# Patient Record
Sex: Female | Born: 1990 | Race: White | Hispanic: Yes | Smoking: Never smoker
Health system: Southern US, Community
[De-identification: ages and names within clinical notes are randomized; demographics above are authoritative.]

## PROBLEM LIST (undated history)

## (undated) ENCOUNTER — Inpatient Hospital Stay: Payer: Self-pay

## (undated) DIAGNOSIS — O139 Gestational [pregnancy-induced] hypertension without significant proteinuria, unspecified trimester: Secondary | ICD-10-CM

## (undated) DIAGNOSIS — Q9359 Other deletions of part of a chromosome: Secondary | ICD-10-CM

## (undated) DIAGNOSIS — R739 Hyperglycemia, unspecified: Secondary | ICD-10-CM

## (undated) DIAGNOSIS — E039 Hypothyroidism, unspecified: Secondary | ICD-10-CM

## (undated) DIAGNOSIS — F419 Anxiety disorder, unspecified: Secondary | ICD-10-CM

## (undated) DIAGNOSIS — K589 Irritable bowel syndrome without diarrhea: Secondary | ICD-10-CM

## (undated) DIAGNOSIS — Z8759 Personal history of other complications of pregnancy, childbirth and the puerperium: Secondary | ICD-10-CM

## (undated) HISTORY — DX: Anxiety disorder, unspecified: F41.9

## (undated) HISTORY — DX: Other deletions of part of a chromosome: Q93.59

## (undated) HISTORY — PX: NO PAST SURGERIES: SHX2092

## (undated) HISTORY — DX: Irritable bowel syndrome, unspecified: K58.9

## (undated) HISTORY — PX: OTHER SURGICAL HISTORY: SHX169

---
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
## 2011-11-12 ENCOUNTER — Emergency Department (HOSPITAL_COMMUNITY)
Admission: EM | Admit: 2011-11-12 | Discharge: 2011-11-12 | Disposition: A | Discharge status: Home / Self Care | Payer: BC Managed Care – PPO | Attending: Emergency Medicine | Admitting: Emergency Medicine

## 2011-11-12 ENCOUNTER — Encounter (HOSPITAL_COMMUNITY): Payer: Self-pay | Admitting: Emergency Medicine

## 2011-11-12 DIAGNOSIS — L03039 Cellulitis of unspecified toe: Secondary | ICD-10-CM

## 2011-11-12 DIAGNOSIS — L600 Ingrowing nail: Secondary | ICD-10-CM | POA: Insufficient documentation

## 2011-11-12 MED ORDER — BUPIVACAINE HCL (PF) 0.5 % IJ SOLN
10.0000 mL | Freq: Once | INTRAMUSCULAR | Status: DC
  Filled 2011-11-12: qty 30

## 2011-11-12 MED ORDER — CEPHALEXIN 500 MG PO CAPS: 500.0000 mg | ORAL_CAPSULE | Freq: Four times a day (QID) | ORAL | Status: DC

## 2011-11-12 NOTE — ED Notes (Signed)
Pt presents w/ ingrown toenail right great toe progressing for 2 wks. Has treated only w/ Neosporin oint.

## 2011-11-12 NOTE — ED Provider Notes (Signed)
Medical screening examination/treatment/procedure(s) were performed by non-physician practitioner and as supervising physician I was immediately available for consultation/collaboration.  John-Adam Angelise Petrich, M.D.     John-Adam Tametha Banning, MD 11/12/11 2309 

## 2011-11-12 NOTE — ED Provider Notes (Signed)
History     CSN: [id number]  Arrival date & time 11/12/11  1503   First MD Initiated Contact with Patient 11/12/11 1636      Chief Complaint  Patient presents with  . Ingrown Toenail    (Consider location/radiation/quality/duration/timing/severity/associated sxs/prior treatment) HPI Comments: Jill Evans is a 21 y.o. Female who presents with infected toenail. States hx of ingrown toenails. Pain, swelling, drainage to the toenail. States has been putting bacitracin with no relief. States symptoms began 2 wks ago. Pt has hx of ingrown toenails, left removed by her podiatrist. When asked why she didn't go see him, she stated "it is just easier to come here." PT denies fever, chills, pain swelling extending up the toe.    History reviewed. No pertinent past medical history.  History reviewed. No pertinent past surgical history.  No family history on file.  History  Substance Use Topics  . Smoking status: Never Smoker   . Smokeless tobacco: Never Used  . Alcohol Use: No    OB History    Grav Para Term Preterm Abortions TAB SAB Ect Mult Living                  Review of Systems  Constitutional: Negative for fever and chills.  Respiratory: Negative.   Cardiovascular: Negative.   Skin: Positive for color change and wound.  Neurological: Negative for dizziness, weakness and headaches.    Allergies  Review of patient's allergies indicates no known allergies.  Home Medications  No current outpatient prescriptions on file.  BP 127/85  Pulse 91  Temp 98.3 F (36.8 C) (Oral)  SpO2 100%  LMP 11/05/2011  Physical Exam  Nursing note and vitals reviewed. Constitutional: She appears well-developed and well-nourished. No distress.  Eyes: Conjunctivae normal are normal.  Neck: Neck supple.  Cardiovascular: Normal rate, regular rhythm and normal heart sounds.   Pulmonary/Chest: Effort normal and breath sounds normal. No respiratory distress. She has no wheezes. She has  no rales.  Musculoskeletal: Normal range of motion. She exhibits edema and tenderness.       Swelling, erythema, purulent drainage from the right lateral toenail. Tender to palpation. Full ROM of the toe  Neurological: She is alert.  Skin: Skin is warm and dry.  Psychiatric: She has a normal mood and affect.    ED Course  NAIL REMOVAL due to ingrown edge Date/Time: 11/12/2011 6:24 PM Performed by: Jaynie Crumble A Authorized by: Jaynie Crumble A Consent: Verbal consent obtained. Patient understanding: patient states understanding of the procedure being performed Patient identity confirmed: verbally with patient Location: right foot Location details: right big toe Anesthesia: digital block Local anesthetic: bupivacaine 0.5% without epinephrine Anesthetic total: 4 ml Patient sedated: no Preparation: skin prepped with Betadine Amount removed: 1/2 Side: radial Wedge excision of skin of nail fold: yes Nail bed sutured: no Dressing: antibiotic ointment, gauze roll and dressing applied Comments: thoroughlly irrigated the infected portion of the toe   (including critical care time)  6:27 PM half of the toenail removed. Lateral toe and cuticle infected. Will start on antibiotics. Follow up with podiatrist. Pt otherwise non toxic, afebrile    1. Ingrown right big toenail   2. Infected nailbed of toe       MDM          Lottie Mussel, PA 11/12/11 2259

## 2014-05-23 DIAGNOSIS — F988 Other specified behavioral and emotional disorders with onset usually occurring in childhood and adolescence: Secondary | ICD-10-CM | POA: Diagnosis present

## 2014-08-01 ENCOUNTER — Ambulatory Visit (INDEPENDENT_AMBULATORY_CARE_PROVIDER_SITE_OTHER): Payer: Managed Care, Other (non HMO) | Admitting: Podiatry

## 2014-08-01 VITALS — BP 132/88 | HR 97 | Resp 14

## 2014-08-01 DIAGNOSIS — L600 Ingrowing nail: Secondary | ICD-10-CM

## 2014-08-01 DIAGNOSIS — L03031 Cellulitis of right toe: Secondary | ICD-10-CM

## 2014-08-01 MED ORDER — DOXYCYCLINE HYCLATE 100 MG PO TABS: 100.0000 mg | ORAL_TABLET | Freq: Two times a day (BID) | ORAL | Status: DC

## 2014-08-01 NOTE — Progress Notes (Signed)
Subjective:     Patient ID: Jill Evans, female   DOB: August 01, 1990, 24 y.o.   MRN: [id number]  HPI This npatient returns to the office saying her nail is draining but not infected.  She oresents with redness and drainage around the big toenail right foot.  She previously had the nail removed and allowed to grow back.  It has grown back unattached to the nail bed right foot.  Review of Systems     Objective:   Physical Exam Objective: Review of past medical history, medications, social history and allergies were performed.  Vascular: Dorsalis pedis and posterior tibial pulses were palpable B/L, capillary refill was  WNL B/L, temperature gradient was WNL B/L   Skin:  No signs of symptoms of infection or ulcers on both feet  Nails: her right hallux nail plate is unattached at the end of the right big toenail.  There is rendess swelling and drainage around her big toenail.  There is drainage from under her nail plate.  The distal half of the nail plate is unattached rom the nail  Bed and currectte goes completely under nail plate.  Sensory: Jill Evans monifilament WNL   Orthopedic: Orthopedic evaluation demonstrates all joints distal t ankle have full ROM without crepitus, muscle power WNL B/L     Assessment:     Paronychia     Plan:     ROV   I & D right hallux toenail under anesthesia.  Neosporin/ DSD  DSD.  Home instructions given.  Discussed the reoccurence of the infection in her toe and decided to permanently cauterize the nail matrix next visit if problem occurs.

## 2014-08-01 NOTE — Progress Notes (Signed)
   Subjective:    Patient ID: Jill Evans, female    DOB: Jun 24, 1990, 24 y.o.   MRN: [id number]  HPI  My right great toe has some kind of wound it is draining    Review of Systems  All other systems reviewed and are negative.      Objective:   Physical Exam        Assessment & Plan:

## 2014-08-05 ENCOUNTER — Other Ambulatory Visit: Payer: Self-pay | Admitting: Podiatry

## 2014-08-06 ENCOUNTER — Other Ambulatory Visit: Payer: Self-pay | Admitting: *Deleted

## 2014-08-06 ENCOUNTER — Telehealth: Payer: Self-pay | Admitting: Podiatry

## 2014-08-06 NOTE — Telephone Encounter (Signed)
Spoke with Jill Evans at our main location she had already gotten another message and seen that there was some kind of transmission error and had verbally called in the Rx to the [facility] on [address] and left a message for the patients mother.

## 2014-08-06 NOTE — Telephone Encounter (Signed)
Pt's mom called wanting to know why the Doxycycline 100mg  would not be sent to [facility] [address] for patient since she has an infection. Pt's mom stated that is what both she and Dr. Stacie [facility]ayer decided was best [facility]. Please call mom with update as to why Dr. Stacie AcresMayer will not fill this medication and send to their pharmacy.

## 2014-08-08 ENCOUNTER — Ambulatory Visit (INDEPENDENT_AMBULATORY_CARE_PROVIDER_SITE_OTHER): Payer: Managed Care, Other (non HMO) | Admitting: Podiatry

## 2014-08-08 ENCOUNTER — Encounter: Payer: Self-pay | Admitting: Podiatry

## 2014-08-08 ENCOUNTER — Ambulatory Visit: Payer: Managed Care, Other (non HMO) | Admitting: Podiatry

## 2014-08-08 DIAGNOSIS — Z09 Encounter for follow-up examination after completed treatment for conditions other than malignant neoplasm: Secondary | ICD-10-CM

## 2014-08-09 NOTE — Progress Notes (Signed)
Subjective:     Patient ID: Jill Evans, female   DOB: 12-01-1990, 24 y.o.   MRN: [id number]  HPIThis patient presents to the office following excision of her nail and course of antibiotics for her infection.  She presents to the office saying she is doing well and in no pain.  She requests we perform permanent elimination of the borders of right big toe.     Review of Systems     Objective:   Physical Exam Objective: Review of past medical history, medications, social history and allergies were performed.  Vascular: Dorsalis pedis and posterior tibial pulses were palpable B/L, capillary refill was  WNL B/L, temperature gradient was WNL B/L   Skin:  No signs of symptoms of infection or ulcers on both feet  Nails: Her right hallux nail is healing with no drainage at surgical site.  Mild redness at distal aspect of right hallux.  Healing well.  Sensory: Phoebe PerchSemmes Weinstein monifilament WNL   Orthopedic: Orthopedic evaluation demonstrates all joints distal t ankle have full ROM without crepitus, muscle power WNL B/L     Assessment:     S/p nail surgery     Plan:     ROV.  Discussed scheduling nail surgery for permanent correction of ingrowing nail borders.  To schedule in 2 weeks.

## 2014-08-22 ENCOUNTER — Ambulatory Visit (INDEPENDENT_AMBULATORY_CARE_PROVIDER_SITE_OTHER): Payer: Managed Care, Other (non HMO) | Admitting: Podiatry

## 2014-08-22 DIAGNOSIS — L03031 Cellulitis of right toe: Secondary | ICD-10-CM

## 2014-08-22 NOTE — Progress Notes (Signed)
Subjective:     Patient ID: Jill Evans, female   DOB: [birth year], 24 y.o.   MRN: [id number]  HPIPatient presents to the office for planned nail surgery right big toe.  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   Review of Systems     Objective:   Physical Exam GENERAL APPEARANCE: Alert, conversant. Appropriately groomed. No acute distress.  VASCULAR: Pedal pulses palpable at 2/4 DP and PT bilateral.  Capillary refill time is immediate to all digits,  Proximal to distal cooling it warm to warm.  Digital hair growth is present bilateral  NEUROLOGIC: sensation is intact epicritically and protectively to 5.07 monofilament at 5/5 sites bilateral.  Light touch is intact bilateral, vibratory sensation intact bilateral, achilles tendon reflex is intact bilateral.  MUSCULOSKELETAL: acceptable muscle strength, tone and stability bilateral.  Intrinsic muscluature intact bilateral.  Rectus appearance of foot and digits noted bilateral.   DERMATOLOGIC: skin color, texture, and turgor are within normal limits.  No preulcerative lesions or ulcers  are seen, no interdigital maceration noted.  No open lesions present.  Digital nails are asymptomatic. No drainage noted. NAILS  The right nail plate is absent and the chronic infection has resolved.       Assessment:   Paronychia right hallux    Plan:     ROV  Nail surgery.Treatment options and alternatives discussed.  Recommended permanent phenol matrixectomy and patient agreed.  Right hallux  was prepped with alcohol and a toe block of 3cc of 2% lidocaine plain was administered  .  The toe was then prepped with betadine solution .  The proximal nail borders were freed using periosteal elevator.  Phenol was then applied to the matrix tissue followed by an alcohol wash.  Antibiotic ointment and a dry sterile dressing was applied.  The patient was dispensed instructions for aftercare.

## 2015-06-30 DIAGNOSIS — E559 Vitamin D deficiency, unspecified: Secondary | ICD-10-CM

## 2015-06-30 HISTORY — DX: Vitamin D deficiency, unspecified: E55.9

## 2017-08-25 DIAGNOSIS — N912 Amenorrhea, unspecified: Secondary | ICD-10-CM | POA: Diagnosis not present

## 2017-08-25 DIAGNOSIS — Z0000 Encounter for general adult medical examination without abnormal findings: Secondary | ICD-10-CM | POA: Diagnosis not present

## 2017-08-25 DIAGNOSIS — G441 Vascular headache, not elsewhere classified: Secondary | ICD-10-CM | POA: Diagnosis not present

## 2017-08-25 DIAGNOSIS — E669 Obesity, unspecified: Secondary | ICD-10-CM | POA: Diagnosis not present

## 2017-08-25 DIAGNOSIS — Z79899 Other long term (current) drug therapy: Secondary | ICD-10-CM | POA: Diagnosis not present

## 2017-08-25 DIAGNOSIS — G44021 Chronic cluster headache, intractable: Secondary | ICD-10-CM | POA: Diagnosis not present

## 2017-08-29 DIAGNOSIS — G441 Vascular headache, not elsewhere classified: Secondary | ICD-10-CM | POA: Diagnosis not present

## 2017-08-29 DIAGNOSIS — M545 Low back pain: Secondary | ICD-10-CM | POA: Diagnosis not present

## 2017-08-29 DIAGNOSIS — E669 Obesity, unspecified: Secondary | ICD-10-CM | POA: Diagnosis not present

## 2017-08-29 DIAGNOSIS — Z79899 Other long term (current) drug therapy: Secondary | ICD-10-CM | POA: Diagnosis not present

## 2017-09-12 ENCOUNTER — Telehealth: Payer: Self-pay

## 2017-09-12 DIAGNOSIS — M545 Low back pain: Secondary | ICD-10-CM | POA: Diagnosis not present

## 2017-09-12 DIAGNOSIS — O3680X0 Pregnancy with inconclusive fetal viability, not applicable or unspecified: Secondary | ICD-10-CM

## 2017-09-12 DIAGNOSIS — M25561 Pain in right knee: Secondary | ICD-10-CM | POA: Diagnosis not present

## 2017-09-12 NOTE — Progress Notes (Signed)
Opened in error

## 2017-09-12 NOTE — Telephone Encounter (Signed)
Tried to contact Pt to confirm that she will be calling her personal OB/GYN per Orthopedic Nurse office to schedule a viability US asap.No answer, does not have VM set up.

## 2017-09-13 ENCOUNTER — Telehealth: Payer: Self-pay

## 2017-09-13 ENCOUNTER — Other Ambulatory Visit (HOSPITAL_COMMUNITY): Payer: Self-pay

## 2017-09-13 DIAGNOSIS — O3680X0 Pregnancy with inconclusive fetal viability, not applicable or unspecified: Secondary | ICD-10-CM

## 2017-09-13 NOTE — Telephone Encounter (Signed)
Pt's mother called stating that her daughter is special needs and they found out yesterday through an x ray that she was pregnant. She stated that during the x ray they "saw the skeleton of a fetus". Mother states pt's LMP was Oct 2018 but not sure how accurate that is. They wanted an appt today but Dr.Dove wanted an U/S before pt could be seen. PT had an U/S scheduled for this morning at 7:45 and cancelled it. I placed the order and the soonest MFM can see her is Friday 8/16 at 1:15. Appt was made. I also made an appt for pt on Monday morning 09/18/17 in our office to see a midwife to discuss U/S results and plan. Mother is aware of appointment dates and times.

## 2017-09-14 ENCOUNTER — Encounter (HOSPITAL_COMMUNITY): Payer: Self-pay

## 2017-09-15 ENCOUNTER — Ambulatory Visit (HOSPITAL_COMMUNITY): Payer: Self-pay

## 2017-09-18 ENCOUNTER — Encounter: Payer: Self-pay | Admitting: Advanced Practice Midwife

## 2017-09-18 ENCOUNTER — Telehealth: Payer: Self-pay

## 2017-09-18 DIAGNOSIS — Z3401 Encounter for supervision of normal first pregnancy, first trimester: Secondary | ICD-10-CM

## 2017-09-18 NOTE — Telephone Encounter (Signed)
Called the phone number on pt's chart to reschedule appt because we needed U/S done before she came in and she cancelled her U/S. When I dial the number [phone]) it gives me busy signal. I have tried multiple times and it continues to give me the busy signal. I then called emergency contact on her chart Bonner Puna(Johnny Thompson [phone]) and he stated that he doesn't know them anymore and they have no contact with each other and he doesn't have a way to get in contact with her. He requests number be removed from pt's chart.

## 2017-09-21 ENCOUNTER — Ambulatory Visit (HOSPITAL_COMMUNITY)
Admission: RE | Admit: 2017-09-21 | Discharge: 2017-09-21 | Disposition: A | Discharge status: Home / Self Care | Payer: BLUE CROSS/BLUE SHIELD | Source: Ambulatory Visit | Attending: Obstetrics & Gynecology | Admitting: Obstetrics & Gynecology

## 2017-09-21 ENCOUNTER — Other Ambulatory Visit (HOSPITAL_COMMUNITY): Payer: Self-pay | Admitting: *Deleted

## 2017-09-21 DIAGNOSIS — O0932 Supervision of pregnancy with insufficient antenatal care, second trimester: Secondary | ICD-10-CM | POA: Diagnosis not present

## 2017-09-21 DIAGNOSIS — Z363 Encounter for antenatal screening for malformations: Secondary | ICD-10-CM

## 2017-09-21 DIAGNOSIS — Z3A28 28 weeks gestation of pregnancy: Secondary | ICD-10-CM | POA: Diagnosis not present

## 2017-09-21 DIAGNOSIS — O3680X0 Pregnancy with inconclusive fetal viability, not applicable or unspecified: Secondary | ICD-10-CM | POA: Insufficient documentation

## 2017-09-21 DIAGNOSIS — Z362 Encounter for other antenatal screening follow-up: Secondary | ICD-10-CM

## 2017-09-21 DIAGNOSIS — O0933 Supervision of pregnancy with insufficient antenatal care, third trimester: Secondary | ICD-10-CM | POA: Diagnosis not present

## 2017-10-10 ENCOUNTER — Ambulatory Visit (INDEPENDENT_AMBULATORY_CARE_PROVIDER_SITE_OTHER): Payer: BLUE CROSS/BLUE SHIELD | Admitting: Obstetrics & Gynecology

## 2017-10-10 ENCOUNTER — Encounter: Payer: Self-pay | Admitting: Obstetrics & Gynecology

## 2017-10-10 DIAGNOSIS — O0933 Supervision of pregnancy with insufficient antenatal care, third trimester: Secondary | ICD-10-CM

## 2017-10-10 DIAGNOSIS — O0930 Supervision of pregnancy with insufficient antenatal care, unspecified trimester: Secondary | ICD-10-CM

## 2017-10-10 DIAGNOSIS — Z113 Encounter for screening for infections with a predominantly sexual mode of transmission: Secondary | ICD-10-CM

## 2017-10-10 DIAGNOSIS — Z3493 Encounter for supervision of normal pregnancy, unspecified, third trimester: Secondary | ICD-10-CM | POA: Diagnosis not present

## 2017-10-10 DIAGNOSIS — Z23 Encounter for immunization: Secondary | ICD-10-CM | POA: Diagnosis not present

## 2017-10-10 DIAGNOSIS — Z3490 Encounter for supervision of normal pregnancy, unspecified, unspecified trimester: Secondary | ICD-10-CM | POA: Diagnosis not present

## 2017-10-10 DIAGNOSIS — Z124 Encounter for screening for malignant neoplasm of cervix: Secondary | ICD-10-CM

## 2017-10-10 DIAGNOSIS — O0990 Supervision of high risk pregnancy, unspecified, unspecified trimester: Secondary | ICD-10-CM | POA: Insufficient documentation

## 2017-10-10 HISTORY — DX: Supervision of pregnancy with insufficient antenatal care, unspecified trimester: O09.30

## 2017-10-10 HISTORY — DX: Supervision of high risk pregnancy, unspecified, unspecified trimester: O09.90

## 2017-10-10 NOTE — Progress Notes (Signed)
  Subjective:    Jill Evans is being seen today for her first obstetrical visit.  This is not a planned pregnancy. She is at [redacted]w[redacted]d gestation. Her obstetrical history is significant for late prenatal care. Relationship with FOB: significant other, not living together. Patient does not intend to breast feed. Pregnancy history fully reviewed.  Patient reports no complaints.  Review of Systems:   Review of Systems  Objective:     BP 137/90   Pulse 85   Ht 4\' 11"  (1.499 m)   Wt 165 lb (74.8 kg)   BMI 33.33 kg/m  Physical Exam  Exam  Heart- rrr Lungs- CTAB Abd- benign, gravid FH- 29 FHR-130s Cervix - normal Good pubic arch    Assessment:    Pregnancy: G1P0 Patient Active Problem List   Diagnosis Date Noted  . Normal pregnancy, antepartum 10/10/2017  . Late prenatal care 10/10/2017       Plan:     Initial labs drawn. Prenatal vitamins. Problem list reviewed and updated. 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 BFA  Mehar Kirkwood C Jebediah Macrae 10/10/2017

## 2017-10-11 LAB — HIV ANTIBODY (ROUTINE TESTING W REFLEX): HIV: NONREACTIVE

## 2017-10-11 LAB — OBSTETRIC PANEL
Antibody Screen: NOT DETECTED
Basophils Absolute: 22 cells/uL (ref 0–200)
Basophils Relative: 0.2 %
EOS ABS: 231 {cells}/uL (ref 15–500)
Eosinophils Relative: 2.1 %
HCT: 32.3 % — ABNORMAL LOW (ref 35.0–45.0)
HEMOGLOBIN: 11.3 g/dL — AB (ref 11.7–15.5)
Hepatitis B Surface Ag: NONREACTIVE
LYMPHS ABS: 2849 {cells}/uL (ref 850–3900)
MCH: 31.1 pg (ref 27.0–33.0)
MCHC: 35 g/dL (ref 32.0–36.0)
MCV: 89 fL (ref 80.0–100.0)
MPV: 11 fL (ref 7.5–12.5)
Monocytes Relative: 6.9 %
NEUTROS PCT: 64.9 %
Neutro Abs: 7139 cells/uL (ref 1500–7800)
Platelets: 298 10*3/uL (ref 140–400)
RBC: 3.63 10*6/uL — ABNORMAL LOW (ref 3.80–5.10)
RDW: 11.7 % (ref 11.0–15.0)
RPR: NONREACTIVE
RUBELLA: 1.59 {index}
TOTAL LYMPHOCYTE: 25.9 %
WBC: 11 10*3/uL — ABNORMAL HIGH (ref 3.8–10.8)
WBCMIX: 759 {cells}/uL (ref 200–950)

## 2017-10-11 LAB — HEMOGLOBIN A1C
EAG (MMOL/L): 5.5 (calc)
Hgb A1c MFr Bld: 5.1 % of total Hgb (ref <5.7)
MEAN PLASMA GLUCOSE: 100 (calc)

## 2017-10-11 LAB — CYTOLOGY - PAP
ADEQUACY: ABSENT
CHLAMYDIA, DNA PROBE: NEGATIVE
Diagnosis: NEGATIVE
NEISSERIA GONORRHEA: NEGATIVE

## 2017-10-19 ENCOUNTER — Ambulatory Visit (HOSPITAL_COMMUNITY)
Admission: RE | Admit: 2017-10-19 | Discharge: 2017-10-19 | Disposition: A | Discharge status: Home / Self Care | Payer: BLUE CROSS/BLUE SHIELD | Source: Ambulatory Visit | Attending: Obstetrics & Gynecology | Admitting: Obstetrics & Gynecology

## 2017-10-19 DIAGNOSIS — Z362 Encounter for other antenatal screening follow-up: Secondary | ICD-10-CM | POA: Diagnosis not present

## 2017-10-19 DIAGNOSIS — O0933 Supervision of pregnancy with insufficient antenatal care, third trimester: Secondary | ICD-10-CM | POA: Diagnosis not present

## 2017-10-19 DIAGNOSIS — Z3687 Encounter for antenatal screening for uncertain dates: Secondary | ICD-10-CM | POA: Diagnosis not present

## 2017-10-19 DIAGNOSIS — Z3A32 32 weeks gestation of pregnancy: Secondary | ICD-10-CM

## 2017-10-24 ENCOUNTER — Ambulatory Visit (INDEPENDENT_AMBULATORY_CARE_PROVIDER_SITE_OTHER): Payer: BLUE CROSS/BLUE SHIELD | Admitting: Nurse Practitioner

## 2017-10-24 ENCOUNTER — Ambulatory Visit: Payer: Self-pay

## 2017-10-24 VITALS — BP 146/97 | HR 76 | Wt 168.9 lb

## 2017-10-24 DIAGNOSIS — O1414 Severe pre-eclampsia complicating childbirth: Secondary | ICD-10-CM | POA: Insufficient documentation

## 2017-10-24 DIAGNOSIS — O0993 Supervision of high risk pregnancy, unspecified, third trimester: Secondary | ICD-10-CM | POA: Diagnosis not present

## 2017-10-24 DIAGNOSIS — O133 Gestational [pregnancy-induced] hypertension without significant proteinuria, third trimester: Secondary | ICD-10-CM

## 2017-10-24 DIAGNOSIS — O0990 Supervision of high risk pregnancy, unspecified, unspecified trimester: Secondary | ICD-10-CM

## 2017-10-24 DIAGNOSIS — O139 Gestational [pregnancy-induced] hypertension without significant proteinuria, unspecified trimester: Secondary | ICD-10-CM

## 2017-10-24 DIAGNOSIS — O360930 Maternal care for other rhesus isoimmunization, third trimester, not applicable or unspecified: Secondary | ICD-10-CM | POA: Diagnosis not present

## 2017-10-24 HISTORY — DX: Severe pre-eclampsia complicating childbirth: O14.14

## 2017-10-24 LAB — POCT URINALYSIS DIP (DEVICE)
BILIRUBIN URINE: NEGATIVE
GLUCOSE, UA: NEGATIVE mg/dL
HGB URINE DIPSTICK: NEGATIVE
KETONES UR: NEGATIVE mg/dL
LEUKOCYTES UA: NEGATIVE
Nitrite: NEGATIVE
Protein, ur: 100 mg/dL — AB
UROBILINOGEN UA: 0.2 mg/dL (ref 0.0–1.0)
pH: 6.5 (ref 5.0–8.0)

## 2017-10-24 MED ORDER — RHO D IMMUNE GLOBULIN 1500 UNIT/2ML IJ SOSY
300.0000 ug | PREFILLED_SYRINGE | Freq: Once | INTRAMUSCULAR | Status: AC
  Administered 2017-10-24: 300 ug via INTRAMUSCULAR

## 2017-10-24 NOTE — Patient Instructions (Addendum)
RETURN for all appointments as scheduled.    Childbirth Education Options: [facility] Department Classes:  Childbirth education classes can help you get ready for a positive parenting experience. You can also meet other expectant parents and get free stuff for your baby. Each class runs for five weeks on the same night and costs $45 for the mother-to-be and her support person. Medicaid covers the cost if you are eligible. Call [phone] to register. [facility] Childbirth Education:  [phone] or [phone] or [email or web address]  Baby & Me Class: Discuss newborn & infant parenting and family adjustment issues with other new mothers in a relaxed environment. Each week brings a new speaker or baby-centered activity. We encourage new mothers to join [place] every Thursday at 11:00am. Babies birth until crawling. No registration or fee. Daddy [employer]: This course offers Dads-to-be the tools and knowledge needed to feel confident on their journey to becoming new fathers. Experienced dads, who have been trained as coaches, teach dads-to-be how to hold, comfort, diaper, swaddle and play with their infant while being able to support the new mom as well. A class for men taught by men. $25/dad Big Brother/Big Sister: Let your children share in the joy of a new brother or sister in this special class designed just for them. Class includes discussion about how families care for babies: swaddling, holding, diapering, safety as well as how they can be helpful in their new role. This class is designed for children ages 4 to 9, but any age is welcome. Please register each child individually. $5/child  Mom Talk: This mom-led group offers support and connection to mothers as they journey through the adjustments and struggles of that sometimes overwhelming first year after the birth of a child. Tuesdays at 10:00am and Thursdays at 6:00pm. Babies welcome. No registration or fee. Breastfeeding  Support Group: This group is a mother-to-mother support circle where moms have the opportunity to share their breastfeeding experiences. A Lactation Consultant is present for questions and concerns. Meets each Tuesday at 11:00am. No fee or registration. Breastfeeding Your Baby: Learn what to expect in the first days of breastfeeding your newborn.  This class will help you feel more confident with the skills needed to begin your breastfeeding experience. Many new mothers are concerned about breastfeeding after leaving the hospital. This class will also address the most common fears and challenges about breastfeeding during the first few weeks, months and beyond. (call for fee) Comfort Techniques and Tour: This 2 hour interactive class will provide you the opportunity to learn & practice hands-on techniques that can help relieve some of the discomfort of labor and encourage your baby to rotate toward the best position for birth. You and your partner will be able to try a variety of labor positions with birth balls and rebozos as well as practice breathing, relaxation, and visualization techniques. A tour of the [facility] is included with this class. $20 per registrant and support person Childbirth Class- Weekend Option: This class is a Weekend version of our Birth & Baby series. It is designed for parents who have a difficult time fitting several weeks of classes into their schedule. It covers the care of your newborn and the basics of labor and childbirth. It also includes a [place] [facility] and lunch. The class is held two consecutive days: beginning on Friday evening from 6:30 - 8:30 p.m. and the next day, Saturday from 9 a.m. - 4 p.m. (call for  fee) Doren Custard Class: Interested in a waterbirth?  This informational class will help you discover whether waterbirth is the right fit for you. Education about waterbirth itself, supplies you would need and how  to assemble your support team is what you can expect from this class. Some obstetrical practices require this class in order to pursue a waterbirth. (Not all obstetrical practices offer waterbirth-check with your healthcare provider.) Register only the expectant mom, but you are encouraged to bring your partner to class! Required if planning waterbirth, no fee. Infant/Child CPR: Parents, grandparents, babysitters, and friends learn Cardio-Pulmonary Resuscitation skills for infants and children. You will also learn how to treat both conscious and unconscious choking in infants and children. This Family & Friends program does not offer certification. Register each participant individually to ensure that enough mannequins are available. (Call for fee) Grandparent Love: Expecting a grandbaby? This class is for you! Learn about the latest infant care and safety recommendations and ways to support your own child as he or she transitions into the parenting role. Taught by Registered Nurses who are childbirth instructors, but most importantly...they are grandmothers too! $10/person. Childbirth Class- Natural Childbirth: This series of 5 weekly classes is for expectant parents who want to learn and practice natural methods of coping with the process of labor and childbirth. Relaxation, breathing, massage, visualization, role of the partner, and helpful positioning are highlighted. Participants learn how to be confident in their body's ability to give birth. This class will empower and help parents make informed decisions about their own care. Includes discussion that will help new parents transition into the immediate postpartum period. [facility] is included. We suggest taking this class between 25-32 weeks, but it's only a recommendation. $75 per registrant and one support person or $30 Medicaid. Childbirth Class- 3 week Series: This option of 3 weekly classes helps you and your  labor partner prepare for childbirth. Newborn care, labor & birth, cesarean birth, pain management, and comfort techniques are discussed and a [place] [facility] is included. The class meets at the same time, on the same day of the week for 3 consecutive weeks beginning with the starting date you choose. $60 for registrant and one support person.  Marvelous Multiples: Expecting twins, triplets, or more? This class covers the differences in labor, birth, parenting, and breastfeeding issues that face multiples' parents. NICU tour is included. Led by a Certified Childbirth Educator who is the mother of twins. No fee. Caring for Baby: This class is for expectant and adoptive parents who want to learn and practice the most up-to-date newborn care for their babies. Focus is on birth through the first six weeks of life. Topics include feeding, bathing, diapering, crying, umbilical cord care, circumcision care and safe sleep. Parents learn to recognize symptoms of illness and when to call the pediatrician. Register only the mom-to-be and your partner or support person can plan to come with you! $10 per registrant and support person Childbirth Class- online option: This online class offers you the freedom to complete a Birth and Baby series in the comfort of your own home. The flexibility of this option allows you to review sections at your own pace, at times convenient to you and your support people. It includes additional video information, animations, quizzes, and extended activities. Get organized with helpful eClass tools, checklists, and trackers. Once you register online for the class, you will receive an email within a few days to accept  the invitation and begin the class when the time is right for you. The content will be available to you for 60 days. $60 for 60 days of online access for you and your support people.  Local Doulas: Natural Baby  Doulas [email or web address] Tel: [phone] [email or web address] [email or web address] [phone] [email or web address] The Labor Hassell Halim  (also do waterbirth tub rental) [phone] [email or web address] [email or web address] Birth Doula [phone] [email or web address] [place] [email or web address] [employer] (PADA) [email or web address] [place] Birth [facility] Baby  [email or web address] Considering Waterbirth? Guide for patients at Center for [employer]  Why consider waterbirth?  . Gentle birth for babies . Less pain medicine used in labor . May allow for passive descent/less pushing . May reduce perineal tears  . More mobility and instinctive maternal position changes . Increased maternal relaxation . Reduced blood pressure in labor  Is waterbirth safe? What are the risks of infection, drowning or other complications?  . Infection: o Very low risk (3.7 % for tub vs 4.8% for bed) o 7 in 8000 waterbirths with documented infection o Poorly cleaned equipment most common cause o Slightly lower group B strep transmission rate  . Drowning o Maternal:  - Very low risk   - Related to seizures or fainting o Newborn:  - Very low risk. No evidence of increased risk of respiratory problems in multiple large studies - Physiological protection from breathing under water - Avoid underwater birth if there are any fetal complications - Once baby's head is out of the water, keep it out.  . Birth complication o Some reports of cord trauma, but risk decreased by bringing baby to surface gradually o No evidence of increased risk of shoulder dystocia. Mothers can usually change positions faster in water than in a bed, possibly aiding the maneuvers to free the shoulder.   You  must attend a Doren Custard class at [facility]  3rd Wednesday of every month from 7-9pm  [employer] by calling [phone] or online at [email or web address]  Bring [place] the certificate from the class to your prenatal appointment  Meet with a midwife at 36 weeks to see if you can still plan a waterbirth and to sign the consent.   Purchase or rent the following supplies:   Water Birth Pool (Birth Pool in a Box or Gideon for instance)  (Tubs start ~$125)  Single-use disposable tub liner designed for your brand of tub  New garden hose labeled "lead-free", "suitable for drinking water",  Electric drain pump to remove water (We recommend 792 gallon per hour or greater pump.)   Separate garden hose to remove the dirty water  Fish net  Bathing suit top (optional)  Long-handled mirror (optional)  Places to purchase or rent supplies  GotWebTools.is for tub purchases and supplies  Waterbirthsolutions.com for tub purchases and supplies  The Labor Ladies (www.[email or web address]) $275 for tub rental/set-up & take down/kit   [employer] (http://www.fleming.com/.htm) Information regarding doulas (labor support) who provide pool rentals  Our practice has a Birth Pool in a Box tub at the hospital that you may borrow on a first-come-first-served basis. It is your responsibility to to set up, clean and break down the tub. We cannot guarantee the availability of this tub in advance. You are responsible for bringing all accessories listed above. If you do not have all necessary supplies you cannot have a waterbirth.    Things that would prevent you from having a waterbirth:  Premature, <37wks  Previous cesarean birth  Presence of thick meconium-stained fluid  Multiple gestation (Twins, triplets, etc.)  Uncontrolled diabetes or gestational diabetes requiring medication  Hypertension requiring medication or diagnosis of pre-eclampsia  Heavy  vaginal bleeding  Non-reassuring fetal heart rate  Active infection (MRSA, etc.). Group B Strep is NOT a contraindication for  waterbirth.  If your labor has to be induced and induction method requires continuous  monitoring of the baby's heart rate  Other risks/issues identified by your obstetrical provider  Please remember that birth is unpredictable. Under certain unforeseeable circumstances your provider may advise against giving birth in the tub. These decisions will be made on a case-by-case basis and with the safety of you and your baby as our highest priority.      Hypertension During Pregnancy Hypertension is also called high blood pressure. High blood pressure means that the force of your blood moving in your body is too strong. When you are pregnant, this condition should be watched carefully. It can cause problems for you and your baby. Follow these instructions at home: Eating and drinking  Drink enough fluid to keep your pee (urine) clear or pale yellow.  Eat healthy foods that are low in salt (sodium). ? Do not add salt to your food. ? Check labels on foods and drinks to see much salt is in them. Look on the label where you see "Sodium." Lifestyle  Do not use any products that contain nicotine or tobacco, such as cigarettes and e-cigarettes. If you need help quitting, ask your doctor.  Do not use alcohol.  Avoid caffeine.  Avoid stress. Rest and get plenty of sleep. General instructions  Take over-the-counter and prescription medicines only as told by your doctor.  While lying down, lie on your left side. This keeps pressure off your baby.  While sitting or lying down, raise (elevate) your feet. Try putting some pillows under your lower legs.  Exercise regularly. Ask your doctor what kinds of exercise are best for you.  Keep all prenatal and follow-up visits as told by your doctor. This is important. Contact a doctor if:  You have symptoms that your  doctor told you to watch for, such as: ? Fever. ? Throwing up (vomiting). ? Headache. Get help right away if:  You have very bad pain in your belly (abdomen).  You are throwing up, and this does not get better with treatment.  You suddenly get swelling in your hands, ankles, or face.  You gain 4 lb (1.8 kg) or more in 1 week.  You get bleeding from your vagina.  You have blood in your pee.  You do not feel your baby moving as much as normal.  You have a change in vision.  You have muscle twitching or sudden tightening (spasms).  You have trouble breathing.  Your lips or fingernails turn blue. This information is not intended to replace advice given to you by your health care provider. Make sure you discuss any questions you have with your health care provider. Document Released: 02/19/2010 Document Revised: 09/29/2015 Document Reviewed: 09/29/2015 Elsevier Interactive Patient Education  [employer].

## 2017-10-24 NOTE — Progress Notes (Signed)
    Subjective:  Jill Evans is a 27 y.o. G1P0 at 1942w6d being seen today for ongoing prenatal care.  She is currently monitored for the following issues for this high-risk pregnancy and has Normal pregnancy, antepartum; Late prenatal care; and Gestational hypertension affecting first pregnancy on their problem list.  Patient reports has had occasional headaches but nothing severe, continues to have swelling of feet but is on her feet at work a lot..  Contractions: Not present. Vag. Bleeding: None.  Movement: Present. Denies leaking of fluid.   The following portions of the patient's history were reviewed and updated as appropriate: allergies, current medications, past family history, past medical history, past social history, past surgical history and problem list. Problem list updated.  Objective:   Vitals:   10/24/17 0825 10/24/17 0828  BP: (!) 147/98 (!) 146/97  Pulse: 78 76  Weight: 168 lb 14.4 oz (76.6 kg)     Fetal Status: Fetal Heart Rate (bpm): 145 Fundal Height: 33 cm Movement: Present     General:  Alert, oriented and cooperative. Patient is in no acute distress.  Skin: Skin is warm and dry. No rash noted.   Cardiovascular: Normal heart rate noted  Respiratory: Normal respiratory effort, no problems with respiration noted  Abdomen: Soft, gravid, appropriate for gestational age. Pain/Pressure: Present     Pelvic:  Cervical exam deferred        Extremities: Normal range of motion.  Edema: Mild pitting, slight indentation  Mental Status: Normal mood and affect. Normal behavior. Normal judgment and thought content.   Urinalysis:      Assessment and Plan:  Pregnancy: G1P0 at 4542w6d  1. Normal pregnancy, antepartum Watch position - currently breech Has not yet had glucola - will plan for later this week Will get Rhogam today  2. Gestational hypertension affecting first pregnancy BP is elevated today and had a minimal elevation at NOB visit. Evaluate for preeclampsia  today. NST today and will plan for testing Reviewed signs of worsening BP - visual changes, severe headaches, RUQ pain and advised to go to MAU if these symptoms occur Likely too late to start ASA for benefit.  - Comprehensive metabolic panel - Protein / creatinine ratio, urine - CBC - Urinalysis  Preterm labor symptoms and general obstetric precautions including but not limited to vaginal bleeding, contractions, leaking of fluid and fetal movement were reviewed in detail with the patient. Please refer to After Visit Summary for other counseling recommendations.  Return in about 2 days (around 10/26/2017) for BP check, and glucola.  Nolene BernheimERRI BURLESON, RN, MSN, NP-BC Nurse Practitioner, [facility], [facility] 10/24/2017 9:01 AM

## 2017-10-25 LAB — COMPREHENSIVE METABOLIC PANEL
A/G RATIO: 1.2 (ref 1.2–2.2)
ALT: 12 IU/L (ref 0–32)
AST: 14 IU/L (ref 0–40)
Albumin: 3.2 g/dL — ABNORMAL LOW (ref 3.5–5.5)
Alkaline Phosphatase: 125 IU/L — ABNORMAL HIGH (ref 39–117)
BUN/Creatinine Ratio: 24 — ABNORMAL HIGH (ref 9–23)
BUN: 12 mg/dL (ref 6–20)
Bilirubin Total: <0.2 mg/dL (ref 0.0–1.2)
CALCIUM: 8.7 mg/dL (ref 8.7–10.2)
CO2: 17 mmol/L — AB (ref 20–29)
CREATININE: 0.49 mg/dL — AB (ref 0.57–1.00)
Chloride: 104 mmol/L (ref 96–106)
GFR, EST AFRICAN AMERICAN: 156 mL/min/{1.73_m2} (ref >59)
GFR, EST NON AFRICAN AMERICAN: 135 mL/min/{1.73_m2} (ref >59)
Globulin, Total: 2.7 g/dL (ref 1.5–4.5)
Glucose: 81 mg/dL (ref 65–99)
POTASSIUM: 4.3 mmol/L (ref 3.5–5.2)
Sodium: 137 mmol/L (ref 134–144)
TOTAL PROTEIN: 5.9 g/dL — AB (ref 6.0–8.5)

## 2017-10-25 LAB — CBC
HEMATOCRIT: 33.3 % — AB (ref 34.0–46.6)
HEMOGLOBIN: 11.5 g/dL (ref 11.1–15.9)
MCH: 30.5 pg (ref 26.6–33.0)
MCHC: 34.5 g/dL (ref 31.5–35.7)
MCV: 88 fL (ref 79–97)
Platelets: 298 10*3/uL (ref 150–450)
RBC: 3.77 x10E6/uL (ref 3.77–5.28)
RDW: 11.8 % — ABNORMAL LOW (ref 12.3–15.4)
WBC: 10.2 10*3/uL (ref 3.4–10.8)

## 2017-10-25 LAB — PROTEIN / CREATININE RATIO, URINE
Creatinine, Urine: 113.1 mg/dL
PROTEIN/CREAT RATIO: 1066 mg/g{creat} — AB (ref 0–200)
Protein, Ur: 120.6 mg/dL

## 2017-10-26 ENCOUNTER — Other Ambulatory Visit: Payer: Self-pay

## 2017-10-26 ENCOUNTER — Inpatient Hospital Stay (HOSPITAL_COMMUNITY)
Admission: AD | Admit: 2017-10-26 | Discharge: 2017-10-28 | DRG: 788 | Disposition: A | Discharge status: Home / Self Care | Payer: BLUE CROSS/BLUE SHIELD | Attending: Obstetrics and Gynecology | Admitting: Obstetrics and Gynecology

## 2017-10-26 ENCOUNTER — Ambulatory Visit (INDEPENDENT_AMBULATORY_CARE_PROVIDER_SITE_OTHER): Payer: BLUE CROSS/BLUE SHIELD | Admitting: General Practice

## 2017-10-26 ENCOUNTER — Encounter (HOSPITAL_COMMUNITY): Payer: Self-pay | Admitting: *Deleted

## 2017-10-26 ENCOUNTER — Encounter: Payer: Self-pay | Admitting: Obstetrics and Gynecology

## 2017-10-26 ENCOUNTER — Other Ambulatory Visit: Payer: BLUE CROSS/BLUE SHIELD

## 2017-10-26 VITALS — BP 163/99 | HR 61

## 2017-10-26 DIAGNOSIS — O1414 Severe pre-eclampsia complicating childbirth: Secondary | ICD-10-CM | POA: Diagnosis not present

## 2017-10-26 DIAGNOSIS — O99284 Endocrine, nutritional and metabolic diseases complicating childbirth: Secondary | ICD-10-CM | POA: Diagnosis present

## 2017-10-26 DIAGNOSIS — O26893 Other specified pregnancy related conditions, third trimester: Secondary | ICD-10-CM | POA: Diagnosis not present

## 2017-10-26 DIAGNOSIS — Z0130 Encounter for examination of blood pressure without abnormal findings: Secondary | ICD-10-CM

## 2017-10-26 DIAGNOSIS — Z6791 Unspecified blood type, Rh negative: Secondary | ICD-10-CM

## 2017-10-26 DIAGNOSIS — O26899 Other specified pregnancy related conditions, unspecified trimester: Secondary | ICD-10-CM

## 2017-10-26 DIAGNOSIS — O99283 Endocrine, nutritional and metabolic diseases complicating pregnancy, third trimester: Secondary | ICD-10-CM | POA: Diagnosis not present

## 2017-10-26 DIAGNOSIS — Z3A34 34 weeks gestation of pregnancy: Secondary | ICD-10-CM | POA: Diagnosis not present

## 2017-10-26 DIAGNOSIS — O1403 Mild to moderate pre-eclampsia, third trimester: Secondary | ICD-10-CM | POA: Diagnosis not present

## 2017-10-26 DIAGNOSIS — O99344 Other mental disorders complicating childbirth: Secondary | ICD-10-CM | POA: Diagnosis present

## 2017-10-26 DIAGNOSIS — E039 Hypothyroidism, unspecified: Secondary | ICD-10-CM | POA: Diagnosis not present

## 2017-10-26 DIAGNOSIS — O99214 Obesity complicating childbirth: Secondary | ICD-10-CM | POA: Diagnosis not present

## 2017-10-26 DIAGNOSIS — Z1379 Encounter for other screening for genetic and chromosomal anomalies: Secondary | ICD-10-CM | POA: Diagnosis not present

## 2017-10-26 DIAGNOSIS — O329XX0 Maternal care for malpresentation of fetus, unspecified, not applicable or unspecified: Secondary | ICD-10-CM

## 2017-10-26 DIAGNOSIS — E669 Obesity, unspecified: Secondary | ICD-10-CM | POA: Diagnosis not present

## 2017-10-26 DIAGNOSIS — O1493 Unspecified pre-eclampsia, third trimester: Secondary | ICD-10-CM | POA: Diagnosis not present

## 2017-10-26 DIAGNOSIS — O99213 Obesity complicating pregnancy, third trimester: Secondary | ICD-10-CM | POA: Diagnosis not present

## 2017-10-26 DIAGNOSIS — O1410 Severe pre-eclampsia, unspecified trimester: Secondary | ICD-10-CM | POA: Diagnosis not present

## 2017-10-26 DIAGNOSIS — O99820 Streptococcus B carrier state complicating pregnancy: Secondary | ICD-10-CM | POA: Diagnosis not present

## 2017-10-26 DIAGNOSIS — O43813 Placental infarction, third trimester: Secondary | ICD-10-CM | POA: Diagnosis not present

## 2017-10-26 DIAGNOSIS — Z3A33 33 weeks gestation of pregnancy: Secondary | ICD-10-CM | POA: Diagnosis not present

## 2017-10-26 DIAGNOSIS — O99210 Obesity complicating pregnancy, unspecified trimester: Secondary | ICD-10-CM | POA: Insufficient documentation

## 2017-10-26 DIAGNOSIS — O99824 Streptococcus B carrier state complicating childbirth: Secondary | ICD-10-CM | POA: Diagnosis not present

## 2017-10-26 DIAGNOSIS — O1413 Severe pre-eclampsia, third trimester: Secondary | ICD-10-CM | POA: Diagnosis not present

## 2017-10-26 DIAGNOSIS — O0970 Supervision of high risk pregnancy due to social problems, unspecified trimester: Secondary | ICD-10-CM

## 2017-10-26 DIAGNOSIS — O0990 Supervision of high risk pregnancy, unspecified, unspecified trimester: Secondary | ICD-10-CM

## 2017-10-26 DIAGNOSIS — O9081 Anemia of the puerperium: Secondary | ICD-10-CM | POA: Diagnosis not present

## 2017-10-26 DIAGNOSIS — Z8279 Family history of other congenital malformations, deformations and chromosomal abnormalities: Secondary | ICD-10-CM | POA: Diagnosis not present

## 2017-10-26 DIAGNOSIS — O0993 Supervision of high risk pregnancy, unspecified, third trimester: Secondary | ICD-10-CM | POA: Insufficient documentation

## 2017-10-26 HISTORY — DX: Hypothyroidism, unspecified: E03.9

## 2017-10-26 HISTORY — DX: Gestational (pregnancy-induced) hypertension without significant proteinuria, unspecified trimester: O13.9

## 2017-10-26 HISTORY — DX: Hyperglycemia, unspecified: R73.9

## 2017-10-26 HISTORY — DX: Supervision of high risk pregnancy due to social problems, unspecified trimester: O09.70

## 2017-10-26 HISTORY — DX: Other specified pregnancy related conditions, unspecified trimester: O26.899

## 2017-10-26 HISTORY — DX: Maternal care for malpresentation of fetus, unspecified, not applicable or unspecified: O32.9XX0

## 2017-10-26 HISTORY — DX: Unspecified blood type, rh negative: Z67.91

## 2017-10-26 LAB — URINALYSIS, ROUTINE W REFLEX MICROSCOPIC
BILIRUBIN URINE: NEGATIVE
Glucose, UA: NEGATIVE mg/dL
Hgb urine dipstick: NEGATIVE
KETONES UR: NEGATIVE mg/dL
LEUKOCYTES UA: NEGATIVE
Nitrite: NEGATIVE
PROTEIN: 100 mg/dL — AB
Specific Gravity, Urine: 1.019 (ref 1.005–1.030)
pH: 6 (ref 5.0–8.0)

## 2017-10-26 LAB — CBC
HCT: 33.5 % — ABNORMAL LOW (ref 36.0–46.0)
HEMOGLOBIN: 11.5 g/dL — AB (ref 12.0–15.0)
MCH: 30.7 pg (ref 26.0–34.0)
MCHC: 34.3 g/dL (ref 30.0–36.0)
MCV: 89.6 fL (ref 78.0–100.0)
PLATELETS: 268 10*3/uL (ref 150–400)
RBC: 3.74 MIL/uL — AB (ref 3.87–5.11)
RDW: 11.8 % (ref 11.5–15.5)
WBC: 9 10*3/uL (ref 4.0–10.5)

## 2017-10-26 LAB — COMPREHENSIVE METABOLIC PANEL
ALBUMIN: 2.6 g/dL — AB (ref 3.5–5.0)
ALT: 16 U/L (ref 0–44)
AST: 21 U/L (ref 15–41)
Alkaline Phosphatase: 114 U/L (ref 38–126)
Anion gap: 9 (ref 5–15)
BUN: 12 mg/dL (ref 6–20)
CHLORIDE: 107 mmol/L (ref 98–111)
CO2: 19 mmol/L — AB (ref 22–32)
CREATININE: 0.46 mg/dL (ref 0.44–1.00)
Calcium: 8.4 mg/dL — ABNORMAL LOW (ref 8.9–10.3)
GFR calc non Af Amer: >60 mL/min (ref >60)
GLUCOSE: 122 mg/dL — AB (ref 70–99)
Potassium: 3.7 mmol/L (ref 3.5–5.1)
Sodium: 135 mmol/L (ref 135–145)
Total Bilirubin: 0.6 mg/dL (ref 0.3–1.2)
Total Protein: 5.5 g/dL — ABNORMAL LOW (ref 6.5–8.1)

## 2017-10-26 LAB — PROTEIN / CREATININE RATIO, URINE
Creatinine, Urine: 128 mg/dL
PROTEIN CREATININE RATIO: 0.79 mg/mg{creat} — AB (ref 0.00–0.15)
Total Protein, Urine: 101 mg/dL

## 2017-10-26 LAB — OB RESULTS CONSOLE GBS: GBS: POSITIVE

## 2017-10-26 LAB — TSH: TSH: 4.686 u[IU]/mL — ABNORMAL HIGH (ref 0.350–4.500)

## 2017-10-26 MED ORDER — CALCIUM CARBONATE ANTACID 500 MG PO CHEW: 2.0000 | CHEWABLE_TABLET | ORAL | Status: DC | PRN

## 2017-10-26 MED ORDER — ZOLPIDEM TARTRATE 5 MG PO TABS: 5.0000 mg | ORAL_TABLET | Freq: Every evening | ORAL | Status: DC | PRN

## 2017-10-26 MED ORDER — PRENATAL MULTIVITAMIN CH
1.0000 | ORAL_TABLET | Freq: Every day | ORAL | Status: DC
  Administered 2017-10-27 – 2017-10-28 (×2): 1 via ORAL
  Filled 2017-10-26 (×3): qty 1

## 2017-10-26 MED ORDER — LABETALOL HCL 5 MG/ML IV SOLN: 80.0000 mg | INTRAVENOUS | Status: DC | PRN

## 2017-10-26 MED ORDER — HYDRALAZINE HCL 20 MG/ML IJ SOLN: 10.0000 mg | INTRAMUSCULAR | Status: DC | PRN

## 2017-10-26 MED ORDER — LABETALOL HCL 5 MG/ML IV SOLN: 40.0000 mg | INTRAVENOUS | Status: DC | PRN

## 2017-10-26 MED ORDER — LACTATED RINGERS IV SOLN
INTRAVENOUS | Status: DC
  Administered 2017-10-26: 11:00:00 via INTRAVENOUS

## 2017-10-26 MED ORDER — LABETALOL HCL 5 MG/ML IV SOLN
20.0000 mg | INTRAVENOUS | Status: DC | PRN
  Administered 2017-10-26: 20 mg via INTRAVENOUS
  Filled 2017-10-26: qty 4

## 2017-10-26 MED ORDER — ACETAMINOPHEN 325 MG PO TABS
650.0000 mg | ORAL_TABLET | ORAL | Status: DC | PRN
  Administered 2017-10-27: 650 mg via ORAL
  Filled 2017-10-26: qty 2

## 2017-10-26 MED ORDER — BETAMETHASONE SOD PHOS & ACET 6 (3-3) MG/ML IJ SUSP
12.0000 mg | Freq: Once | INTRAMUSCULAR | Status: AC
  Administered 2017-10-26: 12 mg via INTRAMUSCULAR
  Filled 2017-10-26: qty 2

## 2017-10-26 MED ORDER — DOCUSATE SODIUM 100 MG PO CAPS: 100.0000 mg | ORAL_CAPSULE | Freq: Two times a day (BID) | ORAL | Status: DC | PRN

## 2017-10-26 NOTE — Progress Notes (Signed)
Patient presents to office today for BP check following elevated BP at OB visit on Tuesday. Patient denies headaches, dizziness or blurry vision. +3 edema noted in feet/lower legs.  Reviewed patient's elevated BP with Dr Debroah Loop & labs from Tuesday. Dr Debroah Loop recommends patient go to MAU for evaluation for pre-e. MAU notified & patient escorted upstairs.

## 2017-10-26 NOTE — H&P (Addendum)
History     CSN: [id number]  Arrival date and time: 10/26/17 4098   First Provider Initiated Contact with Patient 10/26/17 1044      Chief Complaint  Patient presents with  . Hypertension   HPI   [name] is a 27 y.o. female G1P0 @ [redacted]w[redacted]d with GHTN,  here in MAU with complaints of elevated BP. She was in the office today for a BP check and was sent up here for further monitoring and labs. Currently she denies HA, scotoma. + fetal monitoring. No medications for BP, has never had elevated BP.   Rn noted: Pt sent from clinic with elevated BP, denies HA, visual changes or epigastric pain. No uc's, bleeding or LOF.  Reports good fetal movement.  OB History    Gravida  1   Para      Term      Preterm      AB      Living        SAB      TAB      Ectopic      Multiple      Live Births              Past Medical History:  Diagnosis Date  . Hyperglycemia   . Hypertension   . 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 Pregnancy induced hypertension     Past Surgical History:  Procedure Laterality Date  . NO PAST SURGERIES      Family History  Problem Relation Age of Onset  . Deep vein thrombosis Mother   . Hypertension Mother   . Obesity Mother   . Hypertension Father   . Cancer Father   . Obesity Father     Social History   Tobacco Use  . Smoking status: Never Smoker  . Smokeless tobacco: Never Used  Substance Use Topics  . Alcohol use: No  . Drug use: No    Allergies: No Known Allergies  Medications Prior to Admission  Medication Sig Dispense Refill Last Dose  . Prenatal Vit-Fe Fumarate-FA (PRENATAL MULTIVITAMIN) TABS tablet Take 1 tablet by mouth daily at 12 noon.   Taking   Results for orders placed or performed during the hospital encounter of 10/26/17 (from the past 48 hour(s))  CBC     Status: Abnormal   Collection Time: 10/26/17 10:31 AM  Result Value Ref Range   WBC 9.0 4.0 - 10.5 K/uL   RBC 3.74 (L) 3.87 - 5.11 MIL/uL   Hemoglobin 11.5 (L) 12.0 - 15.0 g/dL   HCT 11.9 (L) 14.7 - 82.9 %   MCV 89.6 78.0 - 100.0 fL   MCH 30.7 26.0 - 34.0 pg   MCHC 34.3 30.0 - 36.0 g/dL   RDW 56.2 13.0 - 86.5 %   Platelets 268 150 - 400 K/uL    Comment: Performed at [facility], [address] [place], [place] [address]    Review of Systems  Eyes: Negative for photophobia.  Gastrointestinal: Negative for abdominal pain.  Neurological: Negative for dizziness and headaches.   Physical Exam   Blood pressure (!) 141/100, pulse 72, temperature 97.7 F (36.5 C), temperature source Oral, resp. rate 18, weight 77.1 kg.  Patient Vitals for the past 24 hrs:  BP Temp Temp src Pulse Resp Weight  10/26/17 1045 (!) 137/95 - - 76 - -  10/26/17 1030 (!) 141/100 - - 72 - -  10/26/17 1013 (!) 151/99 - - 68 - -  10/26/17 0949 (!) 164/99 97.7 F (36.5 C) Oral 74 18 77.1 kg   Physical Exam  Constitutional: She is oriented to person, place, and time. She appears well-developed and well-nourished. No distress.  HENT:  Head: Normocephalic.  Eyes: Pupils are equal, round, and reactive to light.  Neck: Neck supple.  Respiratory: Effort normal.  Musculoskeletal: She exhibits edema (3+ pitting edema in bilateral lower extremities ).  Neurological: She is alert and oriented to person, place, and time. She displays normal reflexes.  Negative clonus   Skin: Skin is warm. She is not diaphoretic.  Psychiatric: Her behavior is normal.   Fetal Tracing: Baseline: 130 bpm Variability: Moderate  Accelerations: 15x15 Decelerations: None Toco: None  MAU Course  Procedures  None  MDM  BP reading in clinic at 1610:960/45 which was a retake BP from her intake BP BP reading on arrival to MAU at 0949: 164/99 PIH labs Labetalol protocol initiated Betamethasone ordered Discussed labs with Dr. Vergie Living, will admit to 3rd floor. Dr. Vergie Living to write orders.   Assessment and Plan   A:  1. Severe preeclampsia, third trimester   2. [redacted]  weeks gestation of pregnancy     P:  Admit to 3rd floor Betamethasone ordered  Duane Lope, NP 10/26/2017 11:40 AM   Patient with no s/s of pre-eclampsia on ROS. Patient denies any h/o cHTN. She had mild range BPs at her NOB visit at approx 28wks and had a mild range and just barely severe range BP in clinic. In MAU, she had a just barely severe range BP and then a mild range BP and got labetalol 20mg  IV x 1 at that point. Exam benign on my exam  I told her that she's borderline b/w mild and severe pre-eclampsia. I told her that will keep a close watch on her and not do Mg for now. Pre-eclampsia precautions given and will repeat labs in AM.   Jill Evans, Montez Hageman MD Attending Center for [facility] (Faculty Practice) 10/26/2017 Time: [phone]

## 2017-10-26 NOTE — MAU Note (Signed)
Pt sent from clinic with elevated BP, denies HA, visual changes or epigastric pain.  No uc's, bleeding or LOF.  Reports good fetal movement.

## 2017-10-26 NOTE — MAU Provider Note (Signed)
History     CSN: [id number]  Arrival date and time: 10/26/17 4098   First Provider Initiated Contact with Patient 10/26/17 1044      Chief Complaint  Patient presents with  . Hypertension   HPI   Jill Evans is a 27 y.o. female G1P0 @ [redacted]w[redacted]d with GHTN,  here in MAU with complaints of elevated BP. She was in the office today for a BP check and was sent up here for further monitoring and labs. Currently she denies HA, scotoma. + fetal monitoring. No medications for BP, has never had elevated BP.   Rn noted: Pt sent from clinic with elevated BP, denies HA, visual changes or epigastric pain. No uc's, bleeding or LOF.  Reports good fetal movement.  OB History    Gravida  1   Para      Term      Preterm      AB      Living        SAB      TAB      Ectopic      Multiple      Live Births              Past Medical History:  Diagnosis Date  . Hyperglycemia   . Hypertension   . 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 Pregnancy induced hypertension     Past Surgical History:  Procedure Laterality Date  . NO PAST SURGERIES      Family History  Problem Relation Age of Onset  . Deep vein thrombosis Mother   . Hypertension Mother   . Obesity Mother   . Hypertension Father   . Cancer Father   . Obesity Father     Social History   Tobacco Use  . Smoking status: Never Smoker  . Smokeless tobacco: Never Used  Substance Use Topics  . Alcohol use: No  . Drug use: No    Allergies: No Known Allergies  Medications Prior to Admission  Medication Sig Dispense Refill Last Dose  . Prenatal Vit-Fe Fumarate-FA (PRENATAL MULTIVITAMIN) TABS tablet Take 1 tablet by mouth daily at 12 noon.   Taking   Results for orders placed or performed during the hospital encounter of 10/26/17 (from the past 48 hour(s))  CBC     Status: Abnormal   Collection Time: 10/26/17 10:31 AM  Result Value Ref Range   WBC 9.0 4.0 - 10.5 K/uL   RBC 3.74 (L) 3.87 - 5.11 MIL/uL   Hemoglobin 11.5 (L) 12.0 - 15.0 g/dL   HCT 11.9 (L) 14.7 - 82.9 %   MCV 89.6 78.0 - 100.0 fL   MCH 30.7 26.0 - 34.0 pg   MCHC 34.3 30.0 - 36.0 g/dL   RDW 56.2 13.0 - 86.5 %   Platelets 268 150 - 400 K/uL    Comment: Performed at [facility], [address] [place], [place] [address]    Review of Systems  Eyes: Negative for photophobia.  Gastrointestinal: Negative for abdominal pain.  Neurological: Negative for dizziness and headaches.   Physical Exam   Blood pressure (!) 141/100, pulse 72, temperature 97.7 F (36.5 C), temperature source Oral, resp. rate 18, weight 77.1 kg.  Patient Vitals for the past 24 hrs:  BP Temp Temp src Pulse Resp Weight  10/26/17 1045 (!) 137/95 - - 76 - -  10/26/17 1030 (!) 141/100 - - 72 - -  10/26/17 1013 (!) 151/99 - - 68 - -  10/26/17 0949 (!) 164/99 97.7 F (36.5 C) Oral 74 18 77.1 kg   Physical Exam  Constitutional: She is oriented to person, place, and time. She appears well-developed and well-nourished. No distress.  HENT:  Head: Normocephalic.  Eyes: Pupils are equal, round, and reactive to light.  Neck: Neck supple.  Respiratory: Effort normal.  Musculoskeletal: She exhibits edema (3+ pitting edema in bilateral lower extremities ).  Neurological: She is alert and oriented to person, place, and time. She displays normal reflexes.  Negative clonus   Skin: Skin is warm. She is not diaphoretic.  Psychiatric: Her behavior is normal.   Fetal Tracing: Baseline: 130 bpm Variability: Moderate  Accelerations: 15x15 Decelerations: None Toco: None  MAU Course  Procedures  None  MDM  BP reading in clinic at 8119:147/82 which was a retake BP from her intake BP BP reading on arrival to MAU at 0949: 164/99 PIH labs Labetalol protocol initiated Betamethasone ordered Discussed labs with Dr. Vergie Living, will admit to 3rd floor. Dr. Vergie Living to write orders.   Assessment and Plan   A:  1. Severe preeclampsia, third trimester   2. [redacted]  weeks gestation of pregnancy     P:  Admit to 3rd floor Betamethasone ordered  [name], NP 10/26/2017 11:40 AM

## 2017-10-27 DIAGNOSIS — O99820 Streptococcus B carrier state complicating pregnancy: Secondary | ICD-10-CM

## 2017-10-27 HISTORY — DX: Streptococcus B carrier state complicating pregnancy: O99.820

## 2017-10-27 LAB — COMPREHENSIVE METABOLIC PANEL
ALT: 16 U/L (ref 0–44)
AST: 21 U/L (ref 15–41)
Albumin: 2.7 g/dL — ABNORMAL LOW (ref 3.5–5.0)
Alkaline Phosphatase: 108 U/L (ref 38–126)
Anion gap: 11 (ref 5–15)
BUN: 15 mg/dL (ref 6–20)
CO2: 19 mmol/L — ABNORMAL LOW (ref 22–32)
Calcium: 8.9 mg/dL (ref 8.9–10.3)
Chloride: 104 mmol/L (ref 98–111)
Creatinine, Ser: 0.49 mg/dL (ref 0.44–1.00)
GFR calc Af Amer: >60 mL/min (ref >60)
GFR calc non Af Amer: >60 mL/min (ref >60)
Glucose, Bld: 98 mg/dL (ref 70–99)
Potassium: 4.2 mmol/L (ref 3.5–5.1)
Sodium: 134 mmol/L — ABNORMAL LOW (ref 135–145)
Total Bilirubin: 0.6 mg/dL (ref 0.3–1.2)
Total Protein: 6.6 g/dL (ref 6.5–8.1)

## 2017-10-27 LAB — PROTEIN, URINE, 24 HOUR
Collection Interval-UPROT: 24 hours
PROTEIN, 24H URINE: 882 mg/d — AB (ref 50–100)
Protein, Urine: 126 mg/dL
Urine Total Volume-UPROT: 700 mL

## 2017-10-27 LAB — GLUCOSE TOLERANCE, 2 HOURS W/ 1HR
GLUCOSE, FASTING: 71 mg/dL (ref 65–91)
Glucose, 1 hour: 94 mg/dL (ref 65–179)
Glucose, 2 hour: 120 mg/dL (ref 65–152)

## 2017-10-27 LAB — CREATININE CLEARANCE, URINE, 24 HOUR
COLLECTION INTERVAL-CRCL: 24 h
CREATININE 24H UR: 714 mg/d (ref 600–1800)
CREATININE, URINE: 102.01 mg/dL
Creatinine Clearance: 101 mL/min (ref 75–115)
URINE TOTAL VOLUME-CRCL: 700 mL

## 2017-10-27 LAB — CBC
HEMATOCRIT: 33 % — AB (ref 36.0–46.0)
HEMOGLOBIN: 11.4 g/dL — AB (ref 12.0–15.0)
MCH: 30.9 pg (ref 26.0–34.0)
MCHC: 34.5 g/dL (ref 30.0–36.0)
MCV: 89.4 fL (ref 78.0–100.0)
Platelets: 288 10*3/uL (ref 150–400)
RBC: 3.69 MIL/uL — AB (ref 3.87–5.11)
RDW: 11.9 % (ref 11.5–15.5)
WBC: 14.9 10*3/uL — AB (ref 4.0–10.5)

## 2017-10-27 LAB — CULTURE, BETA STREP (GROUP B ONLY)

## 2017-10-27 LAB — T4, FREE: Free T4: 0.63 ng/dL — ABNORMAL LOW (ref 0.82–1.77)

## 2017-10-27 MED ORDER — BETAMETHASONE SOD PHOS & ACET 6 (3-3) MG/ML IJ SUSP
12.0000 mg | Freq: Once | INTRAMUSCULAR | Status: AC
  Administered 2017-10-27: 12 mg via INTRAMUSCULAR
  Filled 2017-10-27: qty 2

## 2017-10-27 MED ORDER — LEVOTHYROXINE SODIUM 100 MCG PO TABS
100.0000 ug | ORAL_TABLET | Freq: Every day | ORAL | Status: DC
  Administered 2017-10-28: 100 ug via ORAL
  Filled 2017-10-27: qty 1

## 2017-10-27 NOTE — Progress Notes (Addendum)
Daily Antepartum Note  Admission Date: 10/26/2017 Current Date: 10/27/2017 10:58 AM  Jill Evans is a 27 y.o. G1P0 @ [redacted]w[redacted]d, HD#2, admitted for ?severe pre-eclampsia (BP, protein).  Pregnancy complicated by: Patient Active Problem List   Diagnosis Date Noted  . GBS (group B Streptococcus carrier), +RV culture, currently pregnant 10/27/2017  . Malpresentation before onset of labor 10/26/2017  . Rh negative state in antepartum period 10/26/2017  . Supervision of high risk pregnancy due to social problems 10/26/2017  . Obesity in pregnancy 10/26/2017  . BMI 30s 10/26/2017  . Hypothyroidism   . Mild pre-eclampsia 10/24/2017  . Supervision of high risk pregnancy, antepartum 10/10/2017  . Late prenatal care 10/10/2017    Overnight/24hr events:  none  Subjective:  No s/s of pre-eclampsia  Objective:    Current Vital Signs 24h Vital Sign Ranges  T 97.7 F (36.5 C) Temp  Avg: 98.2 F (36.8 C)  Min: 97.7 F (36.5 C)  Max: 98.4 F (36.9 C)  BP 132/70 BP  Min: 116/85  Max: 155/95  HR 74 Pulse  Avg: 76.7  Min: 69  Max: 101  RR 18 Resp  Avg: 18.3  Min: 17  Max: 20  SaO2 98 % Room Air SpO2  Avg: 97.9 %  Min: 95 %  Max: 99 %       24 Hour I/O Current Shift I/O  Time Ins Outs 09/26 0701 - 09/27 0700 In: 840 [P.O.:840] Out: 600 [Urine:600] No intake/output data recorded.   Patient Vitals for the past 24 hrs:  BP Temp Temp src Pulse Resp SpO2 Height Weight  10/27/17 0758 132/70 - - 74 18 98 % - -  10/27/17 0756 - 97.7 F (36.5 C) Oral 72 18 99 % - -  10/27/17 0444 130/87 98.4 F (36.9 C) Oral 70 18 99 % - -  10/26/17 2308 (!) 138/91 98.3 F (36.8 C) Oral 80 18 99 % - -  10/26/17 1940 126/89 98.4 F (36.9 C) Oral 69 20 99 % - -  10/26/17 1935 - - - - 17 - - -  10/26/17 1648 116/85 98 F (36.7 C) - 79 18 95 % - -  10/26/17 1305 (!) 155/95 - - 70 19 96 % 4\' 11"  (1.499 m) 77.1 kg  10/26/17 1215 140/87 - - 76 - - - -  10/26/17 1200 134/87 - - 70 - - - -  10/26/17 1145 (!)  148/96 - - (!) 101 - - - -  10/26/17 1130 129/83 - - 83 - - - -  10/26/17 1115 (!) 135/92 - - 78 - - - -  10/26/17 1100 131/85 - - 75 - - - -   EFM: 130 baseline, +accels, no decel, mod variability Toco: quit  Physical exam: General: Well nourished, well developed female in no acute distress. Abdomen: gravid ntp Cardiovascular: S1, S2 normal, no murmur, rub or gallop, regular rate and rhythm Respiratory: CTAB Extremities: no clubbing, cyanosis or edema Skin: Warm and dry.   Medications: Current Facility-Administered Medications  Medication Dose Route Frequency Provider Last Rate Last Dose  . acetaminophen (TYLENOL) tablet 650 mg  650 mg Oral Q4H PRN [place] Bing, MD   650 mg at 10/27/17 0449  . calcium carbonate (TUMS - dosed in mg elemental calcium) chewable tablet 400 mg of elemental calcium  2 tablet Oral Q4H PRN [place] Bing, MD      . docusate sodium (COLACE) capsule 100 mg  100 mg Oral BID PRN [place] Bing,  MD      . labetalol (NORMODYNE,TRANDATE) injection 20 mg  20 mg Intravenous PRN [place] Bing, MD   20 mg at 10/26/17 1040   And  . labetalol (NORMODYNE,TRANDATE) injection 40 mg  40 mg Intravenous PRN Delia Bing, MD       And  . labetalol (NORMODYNE,TRANDATE) injection 80 mg  80 mg Intravenous PRN [place] Bing, MD       And  . hydrALAZINE (APRESOLINE) injection 10 mg  10 mg Intravenous PRN [place] Bing, MD      . prenatal multivitamin tablet 1 tablet  1 tablet Oral Q1200 [place] Bing, MD      . zolpidem (AMBIEN) tablet 5 mg  5 mg Oral QHS PRN [place] Bing, MD        Labs:  Recent Labs  Lab 10/24/17 0911 10/26/17 1031 10/27/17 0708  WBC 10.2 9.0 14.9*  HGB 11.5 11.5* 11.4*  HCT 33.3* 33.5* 33.0*  PLT 298 268 288    Recent Labs  Lab 10/24/17 0911 10/26/17 1031 10/27/17 0708  NA 137 135 134*  K 4.3 3.7 4.2  CL 104 107 104  CO2 17* 19* 19*  BUN 12 12 15   CREATININE 0.49* 0.46 0.49  CALCIUM 8.7 8.4* 8.9  PROT 5.9* 5.5*  6.6  BILITOT <0.2 0.6 0.6  ALKPHOS 125* 114 108  ALT 12 16 16   AST 14 21 21   GLUCOSE 81 122* 98    Radiology: no new imaging 9/19: 25%, 1591gm, AC 15%, normal af 9/24: 8/8 bpp, breech  Assessment & Plan:  Pt stable *Pregnancy:routine care. NSTs bid *?severe pre-eclampsia: pt has only received the one dose of IV labetalol 20mg  at 1040am on 9/26. Will continue to watch for today. I told her that I would've held off on the IV med since her BP was back down to just mild range prior to giving it, but that she is at high risk for developing into severe pre-eclampsia at some point if she doesn't fully declare herself. Will repeat labs tomorrow and if BPs are just mild range potential d/c to home with close follow up. *Preterm: BMZ #2 today *Rh neg: no current issues *h/o hypothyroidism: TSH slightly high yesterday. Will get free t4 *PPx: SCDs, OOB ad lib *FEN/GI: SLIV, regular diet *Dispo: potentially tomorrow  Cornelia Copa. MD Attending Center for [facility])

## 2017-10-28 ENCOUNTER — Other Ambulatory Visit: Payer: Self-pay

## 2017-10-28 ENCOUNTER — Inpatient Hospital Stay (EMERGENCY_DEPARTMENT_HOSPITAL)
Admission: AD | Admit: 2017-10-28 | Discharge: 2017-11-09 | Disposition: A | Discharge status: Home / Self Care | Payer: BLUE CROSS/BLUE SHIELD | Source: Home / Self Care | Attending: Family Medicine | Admitting: Family Medicine

## 2017-10-28 ENCOUNTER — Encounter (HOSPITAL_COMMUNITY): Payer: Self-pay | Admitting: *Deleted

## 2017-10-28 DIAGNOSIS — E669 Obesity, unspecified: Secondary | ICD-10-CM

## 2017-10-28 DIAGNOSIS — O9081 Anemia of the puerperium: Secondary | ICD-10-CM

## 2017-10-28 DIAGNOSIS — O99210 Obesity complicating pregnancy, unspecified trimester: Secondary | ICD-10-CM | POA: Diagnosis present

## 2017-10-28 DIAGNOSIS — O99824 Streptococcus B carrier state complicating childbirth: Secondary | ICD-10-CM | POA: Diagnosis present

## 2017-10-28 DIAGNOSIS — O99284 Endocrine, nutritional and metabolic diseases complicating childbirth: Secondary | ICD-10-CM | POA: Diagnosis present

## 2017-10-28 DIAGNOSIS — O99214 Obesity complicating childbirth: Secondary | ICD-10-CM | POA: Diagnosis present

## 2017-10-28 DIAGNOSIS — O1493 Unspecified pre-eclampsia, third trimester: Secondary | ICD-10-CM | POA: Diagnosis present

## 2017-10-28 DIAGNOSIS — F988 Other specified behavioral and emotional disorders with onset usually occurring in childhood and adolescence: Secondary | ICD-10-CM | POA: Diagnosis present

## 2017-10-28 DIAGNOSIS — O99820 Streptococcus B carrier state complicating pregnancy: Secondary | ICD-10-CM

## 2017-10-28 DIAGNOSIS — Z3A33 33 weeks gestation of pregnancy: Secondary | ICD-10-CM

## 2017-10-28 DIAGNOSIS — O99344 Other mental disorders complicating childbirth: Secondary | ICD-10-CM

## 2017-10-28 DIAGNOSIS — O26893 Other specified pregnancy related conditions, third trimester: Secondary | ICD-10-CM

## 2017-10-28 DIAGNOSIS — E039 Hypothyroidism, unspecified: Secondary | ICD-10-CM | POA: Diagnosis present

## 2017-10-28 DIAGNOSIS — O0930 Supervision of pregnancy with insufficient antenatal care, unspecified trimester: Secondary | ICD-10-CM

## 2017-10-28 DIAGNOSIS — O1414 Severe pre-eclampsia complicating childbirth: Secondary | ICD-10-CM | POA: Diagnosis present

## 2017-10-28 DIAGNOSIS — O1410 Severe pre-eclampsia, unspecified trimester: Secondary | ICD-10-CM

## 2017-10-28 DIAGNOSIS — O0990 Supervision of high risk pregnancy, unspecified, unspecified trimester: Secondary | ICD-10-CM

## 2017-10-28 DIAGNOSIS — O26899 Other specified pregnancy related conditions, unspecified trimester: Secondary | ICD-10-CM

## 2017-10-28 DIAGNOSIS — Q9389 Other deletions from the autosomes: Secondary | ICD-10-CM

## 2017-10-28 DIAGNOSIS — Z6791 Unspecified blood type, Rh negative: Secondary | ICD-10-CM

## 2017-10-28 LAB — URINALYSIS, ROUTINE W REFLEX MICROSCOPIC
BILIRUBIN URINE: NEGATIVE
GLUCOSE, UA: NEGATIVE mg/dL
HGB URINE DIPSTICK: NEGATIVE
Ketones, ur: NEGATIVE mg/dL
LEUKOCYTES UA: NEGATIVE
NITRITE: NEGATIVE
PH: 7 (ref 5.0–8.0)
Protein, ur: >=300 mg/dL — AB
SPECIFIC GRAVITY, URINE: 1.016 (ref 1.005–1.030)

## 2017-10-28 LAB — BPAM RBC
Blood Product Expiration Date: 201910302359
Blood Product Expiration Date: 201910312359
ISSUE DATE / TIME: 201909271707
UNIT TYPE AND RH: 9500
Unit Type and Rh: 9500

## 2017-10-28 LAB — COMPREHENSIVE METABOLIC PANEL
ALBUMIN: 2.5 g/dL — AB (ref 3.5–5.0)
ALK PHOS: 116 U/L (ref 38–126)
ALT: 17 U/L (ref 0–44)
ALT: 20 U/L (ref 0–44)
ALT: 23 U/L (ref 0–44)
ANION GAP: 11 (ref 5–15)
ANION GAP: 11 (ref 5–15)
AST: 22 U/L (ref 15–41)
AST: 22 U/L (ref 15–41)
AST: 26 U/L (ref 15–41)
Albumin: 2.6 g/dL — ABNORMAL LOW (ref 3.5–5.0)
Albumin: 2.8 g/dL — ABNORMAL LOW (ref 3.5–5.0)
Alkaline Phosphatase: 120 U/L (ref 38–126)
Alkaline Phosphatase: 107 U/L (ref 38–126)
Anion gap: 10 (ref 5–15)
BILIRUBIN TOTAL: 0.3 mg/dL (ref 0.3–1.2)
BUN: 14 mg/dL (ref 6–20)
BUN: 15 mg/dL (ref 6–20)
BUN: 16 mg/dL (ref 6–20)
CALCIUM: 9 mg/dL (ref 8.9–10.3)
CO2: 19 mmol/L — ABNORMAL LOW (ref 22–32)
CO2: 20 mmol/L — AB (ref 22–32)
CO2: 21 mmol/L — AB (ref 22–32)
Calcium: 8.6 mg/dL — ABNORMAL LOW (ref 8.9–10.3)
Calcium: 8.9 mg/dL (ref 8.9–10.3)
Chloride: 103 mmol/L (ref 98–111)
Chloride: 104 mmol/L (ref 98–111)
Chloride: 104 mmol/L (ref 98–111)
Creatinine, Ser: 0.53 mg/dL (ref 0.44–1.00)
Creatinine, Ser: 0.46 mg/dL (ref 0.44–1.00)
Creatinine, Ser: 0.48 mg/dL (ref 0.44–1.00)
GFR calc Af Amer: >60 mL/min (ref >60)
GFR calc non Af Amer: >60 mL/min (ref >60)
GFR calc non Af Amer: >60 mL/min (ref >60)
GLUCOSE: 88 mg/dL (ref 70–99)
Glucose, Bld: 100 mg/dL — ABNORMAL HIGH (ref 70–99)
Glucose, Bld: 89 mg/dL (ref 70–99)
POTASSIUM: 3.8 mmol/L (ref 3.5–5.1)
POTASSIUM: 3.9 mmol/L (ref 3.5–5.1)
Potassium: 4 mmol/L (ref 3.5–5.1)
SODIUM: 135 mmol/L (ref 135–145)
Sodium: 133 mmol/L — ABNORMAL LOW (ref 135–145)
Sodium: 135 mmol/L (ref 135–145)
TOTAL PROTEIN: 6.8 g/dL (ref 6.5–8.1)
TOTAL PROTEIN: 5.9 g/dL — AB (ref 6.5–8.1)
Total Bilirubin: 0.2 mg/dL — ABNORMAL LOW (ref 0.3–1.2)
Total Bilirubin: 0.4 mg/dL (ref 0.3–1.2)
Total Protein: 6.2 g/dL — ABNORMAL LOW (ref 6.5–8.1)

## 2017-10-28 LAB — TYPE AND SCREEN
ABO/RH(D): B NEG
Antibody Screen: POSITIVE
UNIT DIVISION: 0
Unit division: 0

## 2017-10-28 LAB — CBC
HCT: 32.7 % — ABNORMAL LOW (ref 36.0–46.0)
HEMATOCRIT: 36.6 % (ref 36.0–46.0)
HEMOGLOBIN: 12.3 g/dL (ref 12.0–15.0)
Hemoglobin: 11.5 g/dL — ABNORMAL LOW (ref 12.0–15.0)
MCH: 31.5 pg (ref 26.0–34.0)
MCH: 30.9 pg (ref 26.0–34.0)
MCHC: 35.2 g/dL (ref 30.0–36.0)
MCHC: 33.6 g/dL (ref 30.0–36.0)
MCV: 89.6 fL (ref 78.0–100.0)
MCV: 92 fL (ref 78.0–100.0)
PLATELETS: 275 10*3/uL (ref 150–400)
Platelets: 319 10*3/uL (ref 150–400)
RBC: 3.65 MIL/uL — ABNORMAL LOW (ref 3.87–5.11)
RBC: 3.98 MIL/uL (ref 3.87–5.11)
RDW: 12 % (ref 11.5–15.5)
RDW: 12 % (ref 11.5–15.5)
WBC: 15 10*3/uL — ABNORMAL HIGH (ref 4.0–10.5)
WBC: 17.3 10*3/uL — ABNORMAL HIGH (ref 4.0–10.5)

## 2017-10-28 LAB — LIPASE, BLOOD: LIPASE: 31 U/L (ref 11–51)

## 2017-10-28 LAB — PROTEIN / CREATININE RATIO, URINE
CREATININE, URINE: 77 mg/dL
Protein Creatinine Ratio: 5.23 mg/mg{Cre} — ABNORMAL HIGH (ref 0.00–0.15)
TOTAL PROTEIN, URINE: 403 mg/dL

## 2017-10-28 MED ORDER — GI COCKTAIL ~~LOC~~
30.0000 mL | Freq: Once | ORAL | Status: AC
  Administered 2017-10-28: 30 mL via ORAL
  Filled 2017-10-28: qty 30

## 2017-10-28 MED ORDER — CALCIUM CARBONATE ANTACID 500 MG PO CHEW
2.0000 | CHEWABLE_TABLET | ORAL | Status: DC | PRN
  Administered 2017-10-29: 400 mg via ORAL
  Filled 2017-10-28: qty 2

## 2017-10-28 MED ORDER — DOCUSATE SODIUM 100 MG PO CAPS
100.0000 mg | ORAL_CAPSULE | Freq: Every day | ORAL | Status: DC
  Administered 2017-10-29 – 2017-10-31 (×2): 100 mg via ORAL
  Filled 2017-10-28 (×4): qty 1

## 2017-10-28 MED ORDER — ONDANSETRON HCL 4 MG/2ML IJ SOLN
4.0000 mg | Freq: Once | INTRAMUSCULAR | Status: AC
  Administered 2017-10-28: 4 mg via INTRAVENOUS
  Filled 2017-10-28: qty 2

## 2017-10-28 MED ORDER — LABETALOL HCL 5 MG/ML IV SOLN
40.0000 mg | INTRAVENOUS | Status: DC | PRN
  Administered 2017-11-02 – 2017-11-06 (×4): 40 mg via INTRAVENOUS
  Filled 2017-10-28 (×6): qty 8

## 2017-10-28 MED ORDER — LEVOTHYROXINE SODIUM 100 MCG PO TABS: 100.0000 ug | ORAL_TABLET | Freq: Every day | ORAL | 1 refills | Status: DC

## 2017-10-28 MED ORDER — LEVOTHYROXINE SODIUM 100 MCG PO TABS
100.0000 ug | ORAL_TABLET | Freq: Every day | ORAL | Status: DC
  Administered 2017-10-29 – 2017-11-09 (×10): 100 ug via ORAL
  Filled 2017-10-28 (×13): qty 1

## 2017-10-28 MED ORDER — PRENATAL MULTIVITAMIN CH
1.0000 | ORAL_TABLET | Freq: Every day | ORAL | Status: DC
  Administered 2017-10-29 – 2017-11-01 (×4): 1 via ORAL
  Filled 2017-10-28 (×4): qty 1

## 2017-10-28 MED ORDER — LABETALOL HCL 5 MG/ML IV SOLN
20.0000 mg | INTRAVENOUS | Status: DC | PRN
  Administered 2017-10-28 – 2017-11-06 (×6): 20 mg via INTRAVENOUS
  Filled 2017-10-28 (×7): qty 4

## 2017-10-28 MED ORDER — LABETALOL HCL 5 MG/ML IV SOLN
80.0000 mg | INTRAVENOUS | Status: DC | PRN
  Administered 2017-11-02 – 2017-11-04 (×2): 80 mg via INTRAVENOUS
  Filled 2017-10-28 (×2): qty 16

## 2017-10-28 MED ORDER — PRENATAL MULTIVITAMIN CH: 1.0000 | ORAL_TABLET | Freq: Every day | ORAL | 11 refills | Status: DC

## 2017-10-28 MED ORDER — LACTATED RINGERS IV SOLN
INTRAVENOUS | Status: DC
  Administered 2017-11-02: 10:00:00 via INTRAVENOUS

## 2017-10-28 MED ORDER — ZOLPIDEM TARTRATE 5 MG PO TABS
5.0000 mg | ORAL_TABLET | Freq: Every evening | ORAL | Status: DC | PRN
  Administered 2017-10-28: 5 mg via ORAL
  Filled 2017-10-28: qty 1

## 2017-10-28 MED ORDER — FAMOTIDINE IN NACL 20-0.9 MG/50ML-% IV SOLN
20.0000 mg | Freq: Two times a day (BID) | INTRAVENOUS | Status: DC
  Administered 2017-10-28 – 2017-10-29 (×2): 20 mg via INTRAVENOUS
  Filled 2017-10-28 (×3): qty 50

## 2017-10-28 MED ORDER — HYDRALAZINE HCL 20 MG/ML IJ SOLN: 10.0000 mg | INTRAMUSCULAR | Status: DC | PRN

## 2017-10-28 MED ORDER — ACETAMINOPHEN 325 MG PO TABS
650.0000 mg | ORAL_TABLET | ORAL | Status: DC | PRN
  Administered 2017-10-28 – 2017-11-01 (×3): 650 mg via ORAL
  Filled 2017-10-28 (×3): qty 2

## 2017-10-28 NOTE — MAU Note (Signed)
Pt states she was standing picking up her prescriptions and she felt fine then she got really hot and said she got a sharp pain at the top of her belly.  Denies LOF/VB/headache.

## 2017-10-28 NOTE — Discharge Summary (Signed)
Physician Discharge Summary  Patient ID: Jill Evans MRN: [id number] DOB/AGE: 27/22/1992 26 y.o.  Admit date: 10/26/2017 Discharge date: 10/28/2017  Admission Diagnoses: Hypertensive disorder of pregnancy Discharge Diagnoses:  Pre eclampsia, mild Active Problems:   Hypothyroidism   GBS (group B Streptococcus carrier), +RV culture, currently pregnant   Discharged Condition: stable  Hospital Course: Admitted with elevated BP which resolved with 1 dose of IV labetalol no ATC meds started and BP normalized.  24 hour urine 842 mg in 24 hours so mild pre eclampsia.  Bp normalized in 130s/80s range in house.  BMZ given. Magneisum was not given  Consults:   Significant Diagnostic Studies: labs:   Treatments: observation  Discharge Exam: Blood pressure 135/85, pulse 62, temperature 98.5 F (36.9 C), temperature source Oral, resp. rate 19, height 4\' 11"  (1.499 m), weight 77.1 kg, SpO2 99 %. General appearance: alert, cooperative and no distress GI: soft, non-tender; bowel sounds normal; no masses,  no organomegaly  Disposition: Discharge disposition: 01-Home or Self Care       Discharge Instructions    Diet - low sodium heart healthy   Complete by:  As directed    Discharge instructions   Complete by:  As directed    Diminished activities but not bedrest Out of work Check BP 4 times daily     Allergies as of 10/28/2017   No Known Allergies     Medication List    TAKE these medications   levothyroxine 100 MCG tablet Commonly known as:  SYNTHROID, LEVOTHROID Take 1 tablet (100 mcg total) by mouth daily before breakfast.   prenatal multivitamin Tabs tablet Take 1 tablet by mouth daily at 12 noon.      Follow-up Information    [facility] [place] Follow up on 10/30/2017.   Why:  prenatal visit + NST Contact information: [address] [place] [address] [phone]          Signed: Lazaro Arms 10/28/2017, 7:50  AM

## 2017-10-28 NOTE — H&P (Signed)
Jill Evans is an 27 y.o. G1P0 [redacted]w[redacted]d female.   Chief Complaint: epigastric pain HPI: Recently hospitalized with elevated BP's. Has proteinuria. S/p BMZ x 2. On arrival today with BP in severe range 180-185/94-104, responded to IV labetalol.   Past Medical History:  Diagnosis Date  . Hyperglycemia   . Hypothyroidism   . 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 Pregnancy induced hypertension     Past Surgical History:  Procedure Laterality Date  . NO PAST SURGERIES      Family History  Problem Relation Age of Onset  . Deep vein thrombosis Mother   . Hypertension Mother   . Obesity Mother   . Hypertension Father   . Cancer Father   . Obesity Father    Social History:  reports that she has never smoked. She has never used smokeless tobacco. She reports that she does not drink alcohol or use drugs.   No Known Allergies  Medications Prior to Admission  Medication Sig Dispense Refill  . levothyroxine (SYNTHROID, LEVOTHROID) 100 MCG tablet Take 1 tablet (100 mcg total) by mouth daily before breakfast. 30 tablet 1  . Prenatal Vit-Fe Fumarate-FA (PRENATAL MULTIVITAMIN) TABS tablet Take 1 tablet by mouth daily at 12 noon. 30 tablet 11     Pertinent items are noted in HPI.  Blood pressure 124/71, pulse 72, temperature 97.9 F (36.6 C), temperature source Oral, resp. rate 17, SpO2 99 %. General appearance: alert, cooperative and appears stated age short stature Head: Normocephalic, without obvious abnormality, atraumatic Neck: supple, symmetrical, trachea midline Lungs: normal effort Heart: regular rate and rhythm Abdomen: gravid, NT Extremities: Homans sign is negative, no sign of DVT Skin: Skin color, texture, turgor normal. No rashes or lesions Neurologic: Grossly normal   Lab Results  Component Value Date   WBC 17.3 (H) 10/28/2017   HGB 12.3 10/28/2017   HCT 36.6 10/28/2017   MCV 92.0 10/28/2017   PLT 319 10/28/2017         ABO, Rh: --/--/B NEG (09/28 1322)   Antibody: POS (09/28 1322)  Rubella: 1.59 (09/10 1543)  RPR: NON-REACTIVE (09/10 1543)  HBsAg: NON-REACTIVE (09/10 1543)  HIV: NON-REACTIVE (09/10 1543)  GBS:   POSITIVE  U/s 9/19 breech 3 lb 8 oz, 25%, normal afi  Assessment/Plan Principal Problem:   Pre-eclampsia, severe, antepartum Active Problems:   Malpresentation before onset of labor   GBS (group B Streptococcus carrier), +RV culture, currently pregnant   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  Given proteinuria, severe range BP's will admit and schedule delivery at 34 wks. If breech will need c-section if no ECV can happen. Meds as needed to control BP.  Reva Bores 10/28/2017, 10:32 PM

## 2017-10-28 NOTE — Discharge Instructions (Signed)
Preeclampsia and Eclampsia °Preeclampsia is a serious condition that develops only during pregnancy. It is also called toxemia of pregnancy. This condition causes high blood pressure along with other symptoms, such as swelling and headaches. These symptoms may develop as the condition gets worse. Preeclampsia may occur at 20 weeks of pregnancy or later. °Diagnosing and treating preeclampsia early is very important. If not treated early, it can cause serious problems for you and your baby. One problem it can lead to is eclampsia, which is a condition that causes muscle jerking or shaking (convulsions or seizures) in the mother. Delivering your baby is the best treatment for preeclampsia or eclampsia. Preeclampsia and eclampsia symptoms usually go away after your baby is born. °What are the causes? °The cause of preeclampsia is not known. °What increases the risk? °The following risk factors make you more likely to develop preeclampsia: °· Being pregnant for the first time. °· Having had preeclampsia during a past pregnancy. °· Having a family history of preeclampsia. °· Having high blood pressure. °· Being pregnant with twins or triplets. °· Being 35 or older. °· Being African-American. °· Having kidney disease or diabetes. °· Having medical conditions such as lupus or blood diseases. °· Being very overweight (obese). ° °What are the signs or symptoms? °The earliest signs of preeclampsia are: °· High blood pressure. °· Increased protein in your urine. Your health care provider will check for this at every visit before you give birth (prenatal visit). ° °Other symptoms that may develop as the condition gets worse include: °· Severe headaches. °· Sudden weight gain. °· Swelling of the hands, face, legs, and feet. °· Nausea and vomiting. °· Vision problems, such as blurred or double vision. °· Numbness in the face, arms, legs, and feet. °· Urinating less than usual. °· Dizziness. °· Slurred speech. °· Abdominal pain,  especially upper abdominal pain. °· Convulsions or seizures. ° °Symptoms generally go away after giving birth. °How is this diagnosed? °There are no screening tests for preeclampsia. Your health care provider will ask you about symptoms and check for signs of preeclampsia during your prenatal visits. You may also have tests that include: °· Urine tests. °· Blood tests. °· Checking your blood pressure. °· Monitoring your baby’s heart rate. °· Ultrasound. ° °How is this treated? °You and your health care provider will determine the treatment approach that is best for you. Treatment may include: °· Having more frequent prenatal exams to check for signs of preeclampsia, if you have an increased risk for preeclampsia. °· Bed rest. °· Reducing how much salt (sodium) you eat. °· Medicine to lower your blood pressure. °· Staying in the hospital, if your condition is severe. There, treatment will focus on controlling your blood pressure and the amount of fluids in your body (fluid retention). °· You may need to take medicine (magnesium sulfate) to prevent seizures. This medicine may be given as an injection or through an IV tube. °· Delivering your baby early, if your condition gets worse. You may have your labor started with medicine (induced), or you may have a cesarean delivery. ° °Follow these instructions at home: °Eating and drinking ° °· Drink enough fluid to keep your urine clear or pale yellow. °· Eat a healthy diet that is low in sodium. Do not add salt to your food. Check nutrition labels to see how much sodium a food or beverage contains. °· Avoid caffeine. °Lifestyle °· Do not use any products that contain nicotine or tobacco, such as cigarettes   and e-cigarettes. If you need help quitting, ask your health care provider. °· Do not use alcohol or drugs. °· Avoid stress as much as possible. Rest and get plenty of sleep. °General instructions °· Take over-the-counter and prescription medicines only as told by your  health care provider. °· When lying down, lie on your side. This keeps pressure off of your baby. °· When sitting or lying down, raise (elevate) your feet. Try putting some pillows underneath your lower legs. °· Exercise regularly. Ask your health care provider what kinds of exercise are best for you. °· Keep all follow-up and prenatal visits as told by your health care provider. This is important. °How is this prevented? °To prevent preeclampsia or eclampsia from developing during another pregnancy: °· Get proper medical care during pregnancy. Your health care provider may be able to prevent preeclampsia or diagnose and treat it early. °· Your health care provider may have you take a low-dose aspirin or a calcium supplement during your next pregnancy. °· You may have tests of your blood pressure and kidney function after giving birth. °· Maintain a healthy weight. Ask your health care provider for help managing weight gain during pregnancy. °· Work with your health care provider to manage any long-term (chronic) health conditions you have, such as diabetes or kidney problems. ° °Contact a health care provider if: °· You gain more weight than expected. °· You have headaches. °· You have nausea or vomiting. °· You have abdominal pain. °· You feel dizzy or light-headed. °Get help right away if: °· You develop sudden or severe swelling anywhere in your body. This usually happens in the legs. °· You gain 5 lbs (2.3 kg) or more during one week. °· You have severe: °? Abdominal pain. °? Headaches. °? Dizziness. °? Vision problems. °? Confusion. °? Nausea or vomiting. °· You have a seizure. °· You have trouble moving any part of your body. °· You develop numbness in any part of your body. °· You have trouble speaking. °· You have any abnormal bleeding. °· You pass out. °This information is not intended to replace advice given to you by your health care provider. Make sure you discuss any questions you have with your health  care provider. °Document Released: 01/15/2000 Document Revised: 09/15/2015 Document Reviewed: 08/24/2015 °[employer] Interactive Patient Education © 2018 [employer] ° °

## 2017-10-28 NOTE — MAU Provider Note (Signed)
Chief Complaint:  Abdominal Pain and Nausea   First Provider Initiated Contact with Patient 10/28/17 1335     HPI: Jill Evans is a 27 y.o. G1P0 at [redacted]w[redacted]d who presents to maternity admissions reporting epigastric pain. Was discharge this morning from the hospital after being admitted for new diagnosis of preeclampsia.  While at the pharmacy this afternoon, had episode of dizziness & onset of severe epigastric pain.  Denies headache, visual disturbance, lower abdominal pain, vaginal bleeding, or LOF. Endorses nausea but hasn't vomited.   Location: epigastrum Quality: sharp & sqeezing Severity: 9/10 in pain scale Duration: 1 hour Timing: constant Modifying factors: none Associated signs and symptoms: nausea & hypertension   Past Medical History:  Diagnosis Date  . Hyperglycemia   . Hypothyroidism   . 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 Pregnancy induced hypertension    OB History  Gravida Para Term Preterm AB Living  1            SAB TAB Ectopic Multiple Live Births               # Outcome Date GA Lbr Len/2nd Weight Sex Delivery Anes PTL Lv  1 Current            Past Surgical History:  Procedure Laterality Date  . NO PAST SURGERIES     Family History  Problem Relation Age of Onset  . Deep vein thrombosis Mother   . Hypertension Mother   . Obesity Mother   . Hypertension Father   . Cancer Father   . Obesity Father    Social History   Tobacco Use  . Smoking status: Never Smoker  . Smokeless tobacco: Never Used  Substance Use Topics  . Alcohol use: No  . Drug use: No   No Known Allergies Medications Prior to Admission  Medication Sig Dispense Refill Last Dose  . levothyroxine (SYNTHROID, LEVOTHROID) 100 MCG tablet Take 1 tablet (100 mcg total) by mouth daily before breakfast. 30 tablet 1   . Prenatal Vit-Fe Fumarate-FA (PRENATAL MULTIVITAMIN) TABS tablet Take 1 tablet by mouth daily at 12 noon. 30 tablet 11     I have reviewed patient's Past Medical Hx,  Surgical Hx, Family Hx, Social Hx, medications and allergies.   ROS:  Review of Systems  Constitutional: Negative.   Eyes: Negative for visual disturbance.  Respiratory: Positive for shortness of breath. Negative for wheezing.   Cardiovascular: Negative for chest pain.  Gastrointestinal: Positive for abdominal pain and nausea. Negative for diarrhea and vomiting.  Genitourinary: Negative.   Neurological: Positive for dizziness.    Physical Exam   Patient Vitals for the past 24 hrs:  BP Temp Pulse Resp SpO2  10/28/17 1431 (!) 144/86 - 68 - -  10/28/17 1421 (!) 144/88 - 68 - -  10/28/17 1411 (!) 143/88 - 61 - -  10/28/17 1351 (!) 141/88 - (!) 55 - -  10/28/17 1341 (!) 153/100 - 72 - -  10/28/17 1330 (!) 157/95 - 66 - -  10/28/17 1316 (!) 185/105 - (!) 59 - -  10/28/17 1302 (!) 180/94 97.9 F (36.6 C) 62 18 100 %  10/28/17 1257 (!) 180/94 - 66 - -    Constitutional: Well-developed, well-nourished female in no acute distress.  Cardiovascular: normal rate & rhythm, no murmur Respiratory: normal effort, lung sounds clear throughout GI: TTP in RUQ & mid epigastric area. Abd soft, gravid appropriate for gestational age. Pos BS x 4 MS: Extremities non tender.  BLE 2+ pitting edema.  Neurologic: Alert and oriented x 4. Patellar DTR 2+, no clonus  NST:  Baseline: 130 bpm, Variability: Good {> 6 bpm), Accelerations: Reactive and Decelerations: Absent   Labs: Results for orders placed or performed during the hospital encounter of 10/28/17 (from the past 24 hour(s))  Protein / creatinine ratio, urine     Status: None (Preliminary result)   Collection Time: 10/28/17  1:04 PM  Result Value Ref Range   Creatinine, Urine 77.00 mg/dL   Total Protein, Urine PENDING mg/dL   Protein Creatinine Ratio PENDING 0.00 - 0.15 mg/mg[Cre]  Comprehensive metabolic panel     Status: Abnormal   Collection Time: 10/28/17  1:22 PM  Result Value Ref Range   Sodium 135 135 - 145 mmol/L   Potassium 3.9 3.5  - 5.1 mmol/L   Chloride 104 98 - 111 mmol/L   CO2 20 (L) 22 - 32 mmol/L   Glucose, Bld 89 70 - 99 mg/dL   BUN 15 6 - 20 mg/dL   Creatinine, Ser 4.09 0.44 - 1.00 mg/dL   Calcium 9.0 8.9 - 81.1 mg/dL   Total Protein 6.8 6.5 - 8.1 g/dL   Albumin 2.8 (L) 3.5 - 5.0 g/dL   AST 22 15 - 41 U/L   ALT 20 0 - 44 U/L   Alkaline Phosphatase 116 38 - 126 U/L   Total Bilirubin 0.3 0.3 - 1.2 mg/dL   GFR calc non Af Amer >60 >60 mL/min   GFR calc Af Amer >60 >60 mL/min   Anion gap 11 5 - 15  CBC     Status: Abnormal   Collection Time: 10/28/17  1:22 PM  Result Value Ref Range   WBC 17.3 (H) 4.0 - 10.5 K/uL   RBC 3.98 3.87 - 5.11 MIL/uL   Hemoglobin 12.3 12.0 - 15.0 g/dL   HCT 91.4 78.2 - 95.6 %   MCV 92.0 78.0 - 100.0 fL   MCH 30.9 26.0 - 34.0 pg   MCHC 33.6 30.0 - 36.0 g/dL   RDW 21.3 08.6 - 57.8 %   Platelets 319 150 - 400 K/uL  Lipase, blood     Status: None   Collection Time: 10/28/17  1:22 PM  Result Value Ref Range   Lipase 31 11 - 51 U/L  Urinalysis, Routine w reflex microscopic     Status: Abnormal   Collection Time: 10/28/17  2:03 PM  Result Value Ref Range   Color, Urine YELLOW YELLOW   APPearance CLOUDY (A) CLEAR   Specific Gravity, Urine 1.016 1.005 - 1.030   pH 7.0 5.0 - 8.0   Glucose, UA NEGATIVE NEGATIVE mg/dL   Hgb urine dipstick NEGATIVE NEGATIVE   Bilirubin Urine NEGATIVE NEGATIVE   Ketones, ur NEGATIVE NEGATIVE mg/dL   Protein, ur >=469 (A) NEGATIVE mg/dL   Nitrite NEGATIVE NEGATIVE   Leukocytes, UA NEGATIVE NEGATIVE   RBC / HPF 0-5 0 - 5 RBC/hpf   WBC, UA 6-10 0 - 5 WBC/hpf   Bacteria, UA RARE (A) NONE SEEN   Squamous Epithelial / LPF 0-5 0 - 5   Mucus PRESENT     Imaging:  No results found.  MAU Course: Orders Placed This Encounter  Procedures  . Urinalysis, Routine w reflex microscopic  . Comprehensive metabolic panel  . CBC  . Protein / creatinine ratio, urine  . Lipase, blood  . Diet regular Room service appropriate? Yes; Fluid consistency: Thin   . Vital signs  . Notify Physician  .  Fetal monitoring  . Continuous tocometry  . Measure blood pressure  . Vital signs  . Defer vaginal exam for vaginal bleeding or PROM <37 weeks  . Initiate Oral Care Protocol  . Initiate Carrier Fluid Protocol  . SCDs  . Continuous tocometry  . Fetal monitoring  . Bed rest with bathroom privileges  . Full code  . ED EKG  . Type and screen [facility]  . Admit to Inpatient (patient's expected length of stay will be greater than 2 midnights or inpatient only procedure)   Meds ordered this encounter  Medications  . AND Linked Order Group   . labetalol (NORMODYNE,TRANDATE) injection 20 mg   . labetalol (NORMODYNE,TRANDATE) injection 40 mg   . labetalol (NORMODYNE,TRANDATE) injection 80 mg   . hydrALAZINE (APRESOLINE) injection 10 mg  . lactated ringers infusion  . ondansetron (ZOFRAN) injection 4 mg  . gi cocktail (Maalox,Lidocaine,Donnatal)  . levothyroxine (SYNTHROID, LEVOTHROID) tablet 100 mcg  . acetaminophen (TYLENOL) tablet 650 mg  . zolpidem (AMBIEN) tablet 5 mg  . docusate sodium (COLACE) capsule 100 mg  . calcium carbonate (TUMS - dosed in mg elemental calcium) chewable tablet 400 mg of elemental calcium  . prenatal multivitamin tablet 1 tablet    MDM: Severe range BPs x 3 on arrival. IV antihypertensive protocol initiated. BPs improved after 1 dose of IV labetalol 20 mg.  CBC & CMP comparable to this morning Reports some improvement in epigastric pain after GI cocktail C/w Dr. Shawnie Pons. Will admit.   Assessment: 1. Pre-eclampsia in third trimester   2. [redacted] weeks gestation of pregnancy     Plan: Admit to antenatal unit per c/w Dr. Shawnie Pons CEFM/TOCO Monitor BPs Continue IV antihypertensive protocol  Judeth Horn, NP 10/28/2017 3:08 PM

## 2017-10-28 NOTE — Progress Notes (Signed)
Discharge instructions and prescriptions given to pt. Discussed signs and symptoms to report, upcoming appointments, and meds. Pt verablizes understanding and has no questions or concerns. FHR tracing reassuring. Pt discharged from hospital in stable condition.

## 2017-10-29 DIAGNOSIS — Z3A33 33 weeks gestation of pregnancy: Secondary | ICD-10-CM

## 2017-10-29 DIAGNOSIS — O1413 Severe pre-eclampsia, third trimester: Secondary | ICD-10-CM

## 2017-10-29 LAB — CBC WITH DIFFERENTIAL/PLATELET
BASOS ABS: 0 10*3/uL (ref 0.0–0.1)
BASOS PCT: 0 %
EOS ABS: 0 10*3/uL (ref 0.0–0.7)
Eosinophils Relative: 0 %
HEMATOCRIT: 30.9 % — AB (ref 36.0–46.0)
HEMOGLOBIN: 10.3 g/dL — AB (ref 12.0–15.0)
Lymphocytes Relative: 24 %
Lymphs Abs: 2.7 10*3/uL (ref 0.7–4.0)
MCH: 30.6 pg (ref 26.0–34.0)
MCHC: 33.3 g/dL (ref 30.0–36.0)
MCV: 91.7 fL (ref 78.0–100.0)
MONOS PCT: 8 %
Monocytes Absolute: 0.9 10*3/uL (ref 0.1–1.0)
NEUTROS PCT: 68 %
Neutro Abs: 7.5 10*3/uL (ref 1.7–7.7)
Platelets: 227 10*3/uL (ref 150–400)
RBC: 3.37 MIL/uL — ABNORMAL LOW (ref 3.87–5.11)
RDW: 12.2 % (ref 11.5–15.5)
WBC: 11.1 10*3/uL — AB (ref 4.0–10.5)

## 2017-10-29 LAB — COMPREHENSIVE METABOLIC PANEL
ALBUMIN: 2.5 g/dL — AB (ref 3.5–5.0)
ALK PHOS: 106 U/L (ref 38–126)
ALT: 22 U/L (ref 0–44)
ANION GAP: 10 (ref 5–15)
AST: 24 U/L (ref 15–41)
BILIRUBIN TOTAL: 0.4 mg/dL (ref 0.3–1.2)
BUN: 13 mg/dL (ref 6–20)
CALCIUM: 8.4 mg/dL — AB (ref 8.9–10.3)
CO2: 20 mmol/L — AB (ref 22–32)
CREATININE: 0.46 mg/dL (ref 0.44–1.00)
Chloride: 106 mmol/L (ref 98–111)
Glucose, Bld: 76 mg/dL (ref 70–99)
Potassium: 4.1 mmol/L (ref 3.5–5.1)
Sodium: 136 mmol/L (ref 135–145)
TOTAL PROTEIN: 5.7 g/dL — AB (ref 6.5–8.1)

## 2017-10-29 MED ORDER — FAMOTIDINE 20 MG PO TABS
20.0000 mg | ORAL_TABLET | Freq: Two times a day (BID) | ORAL | Status: DC
  Administered 2017-10-29 – 2017-11-01 (×7): 20 mg via ORAL
  Filled 2017-10-29 (×7): qty 1

## 2017-10-29 NOTE — Progress Notes (Signed)
Patient ID: Jill Evans, female   DOB: 09/12/1990, 27 y.o.   MRN: [id number] FACULTY PRACTICE ANTEPARTUM(COMPREHENSIVE) NOTE  Jill Evans is a 27 y.o. G1P0 at [redacted]w[redacted]d by best clinical estimate who is admitted for pre-eclampsia.   Fetal presentation is breech. Length of Stay:  1  Days  Subjective: Still with slight epigastric pain if she takes a deep breath Patient reports the fetal movement as active. Patient reports uterine contraction  activity as none. Patient reports  vaginal bleeding as none. Patient describes fluid per vagina as None.  Vitals:  Blood pressure (!) 148/94, pulse (!) 56, temperature 98.1 F (36.7 C), temperature source Oral, resp. rate 17, SpO2 99 %. Physical Examination:  General appearance - alert, well appearing, and in no distress Chest - normal effort Abdomen - gravid, non-tender Fundal Height:  size equals dates Extremities: Homans sign is negative, no sign of DVT  Membranes:intact  Fetal Monitoring:  Baseline: 130 bpm, Variability: Good {> 6 bpm), Accelerations: Reactive and Decelerations: Absent  Labs:  Results for orders placed or performed during the hospital encounter of 10/28/17 (from the past 24 hour(s))  Protein / creatinine ratio, urine   Collection Time: 10/28/17  1:04 PM  Result Value Ref Range   Creatinine, Urine 77.00 mg/dL   Total Protein, Urine 403 mg/dL   Protein Creatinine Ratio 5.23 (H) 0.00 - 0.15 mg/mg[Cre]  Comprehensive metabolic panel   Collection Time: 10/28/17  1:22 PM  Result Value Ref Range   Sodium 135 135 - 145 mmol/L   Potassium 3.9 3.5 - 5.1 mmol/L   Chloride 104 98 - 111 mmol/L   CO2 20 (L) 22 - 32 mmol/L   Glucose, Bld 89 70 - 99 mg/dL   BUN 15 6 - 20 mg/dL   Creatinine, Ser 4.09 0.44 - 1.00 mg/dL   Calcium 9.0 8.9 - 81.1 mg/dL   Total Protein 6.8 6.5 - 8.1 g/dL   Albumin 2.8 (L) 3.5 - 5.0 g/dL   AST 22 15 - 41 U/L   ALT 20 0 - 44 U/L   Alkaline Phosphatase 116 38 - 126 U/L   Total Bilirubin 0.3 0.3 -  1.2 mg/dL   GFR calc non Af Amer >60 >60 mL/min   GFR calc Af Amer >60 >60 mL/min   Anion gap 11 5 - 15  CBC   Collection Time: 10/28/17  1:22 PM  Result Value Ref Range   WBC 17.3 (H) 4.0 - 10.5 K/uL   RBC 3.98 3.87 - 5.11 MIL/uL   Hemoglobin 12.3 12.0 - 15.0 g/dL   HCT 91.4 78.2 - 95.6 %   MCV 92.0 78.0 - 100.0 fL   MCH 30.9 26.0 - 34.0 pg   MCHC 33.6 30.0 - 36.0 g/dL   RDW 21.3 08.6 - 57.8 %   Platelets 319 150 - 400 K/uL  Lipase, blood   Collection Time: 10/28/17  1:22 PM  Result Value Ref Range   Lipase 31 11 - 51 U/L  Type and screen [facility] [place]   Collection Time: 10/28/17  1:22 PM  Result Value Ref Range   ABO/RH(D) B NEG    Antibody Screen POS    Sample Expiration 10/31/2017    Antibody Identification      PASSIVELY ACQUIRED ANTI-D Performed at [facility], [address], [place], [place] [address]    Unit Number [id number]    Blood Component Type RED CELLS,LR    Unit division 00    Status of  Unit ALLOCATED    Transfusion Status OK TO TRANSFUSE    Crossmatch Result COMPATIBLE    Unit Number [id number]    Blood Component Type RED CELLS,LR    Unit division 00    Status of Unit ALLOCATED    Transfusion Status OK TO TRANSFUSE    Crossmatch Result COMPATIBLE   BPAM RBC   Collection Time: 10/28/17  1:22 PM  Result Value Ref Range   Blood Product Unit Number [id number]    Unit Type and Rh 9500    Blood Product Expiration Date [id number]    Blood Product Unit Number [id number]    Unit Type and Rh 9500    Blood Product Expiration Date [id number]   Urinalysis, Routine w reflex microscopic   Collection Time: 10/28/17  2:03 PM  Result Value Ref Range   Color, Urine YELLOW YELLOW   APPearance CLOUDY (A) CLEAR   Specific Gravity, Urine 1.016 1.005 - 1.030   pH 7.0 5.0 - 8.0   Glucose, UA NEGATIVE NEGATIVE mg/dL   Hgb urine dipstick NEGATIVE NEGATIVE   Bilirubin Urine NEGATIVE NEGATIVE   Ketones, ur NEGATIVE NEGATIVE mg/dL    Protein, ur >=756 (A) NEGATIVE mg/dL   Nitrite NEGATIVE NEGATIVE   Leukocytes, UA NEGATIVE NEGATIVE   RBC / HPF 0-5 0 - 5 RBC/hpf   WBC, UA 6-10 0 - 5 WBC/hpf   Bacteria, UA RARE (A) NONE SEEN   Squamous Epithelial / LPF 0-5 0 - 5   Mucus PRESENT   Comprehensive metabolic panel   Collection Time: 10/28/17  4:33 PM  Result Value Ref Range   Sodium 135 135 - 145 mmol/L   Potassium 4.0 3.5 - 5.1 mmol/L   Chloride 104 98 - 111 mmol/L   CO2 21 (L) 22 - 32 mmol/L   Glucose, Bld 88 70 - 99 mg/dL   BUN 16 6 - 20 mg/dL   Creatinine, Ser 4.33 0.44 - 1.00 mg/dL   Calcium 8.9 8.9 - 29.5 mg/dL   Total Protein 5.9 (L) 6.5 - 8.1 g/dL   Albumin 2.5 (L) 3.5 - 5.0 g/dL   AST 26 15 - 41 U/L   ALT 23 0 - 44 U/L   Alkaline Phosphatase 107 38 - 126 U/L   Total Bilirubin 0.4 0.3 - 1.2 mg/dL   GFR calc non Af Amer >60 >60 mL/min   GFR calc Af Amer >60 >60 mL/min   Anion gap 10 5 - 15    Medications:  Scheduled . docusate sodium  100 mg Oral Daily  . levothyroxine  100 mcg Oral QAC breakfast  . prenatal multivitamin  1 tablet Oral Q1200   I have reviewed the patient's current medications.  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 If BP remains elevated consider IOL at that time FHR reassuring Repeat labs today Add SCDs  Reva Bores, MD 10/29/2017,7:46 AM

## 2017-10-30 MED ORDER — SODIUM CHLORIDE 0.9% FLUSH
3.0000 mL | Freq: Two times a day (BID) | INTRAVENOUS | Status: DC
  Administered 2017-10-30 – 2017-11-02 (×6): 3 mL via INTRAVENOUS

## 2017-10-30 NOTE — Progress Notes (Signed)
Patient ID: Jill Evans, female   DOB: 1991/01/23, 27 y.o.   MRN: [id number] FACULTY PRACTICE ANTEPARTUM(COMPREHENSIVE) NOTE  Jill Evans is a 27 y.o. G1P0 with Estimated Date of Delivery: 12/13/17   By   [redacted]w[redacted]d  who is admitted for evaluation of pre eclampsia.    Fetal presentation is cephalic. Length of Stay:  2  Days  Date of admission:10/28/2017  Subjective: No headache or CNS symptoms Still with some sub sternal pain eased with PPI Patient reports the fetal movement as active. Patient reports uterine contraction  activity as none. Patient reports  vaginal bleeding as none. Patient describes fluid per vagina as None.  Vitals:  Blood pressure (!) 150/100, pulse 60, temperature 98.1 F (36.7 C), resp. rate 17, height 4\' 11"  (1.499 m), weight 77.1 kg, SpO2 98 %. Vitals:   10/29/17 1941 10/29/17 2327 10/30/17 0100 10/30/17 0512  BP: 129/80 138/86  (!) 150/100  Pulse: 68 66  60  Resp: 17 16  17   Temp: 97.9 F (36.6 C) (!) 97.5 F (36.4 C)  98.1 F (36.7 C)  TempSrc: Oral Oral    SpO2: 97% 97%  98%  Weight:   77.1 kg   Height:   4\' 11"  (1.499 m)    Physical Examination:  General appearance - alert, well appearing, and in no distress Abdomen - soft, nontender, nondistended, no masses or organomegaly Fundal Height:  size equals dates Pelvic Exam:  . Extremities: extremities normal, atraumatic, no cyanosis or edema with DTRs 2+ bilaterally Membranes:intact  Fetal Monitoring:  Reactive NST     Labs:  Results for orders placed or performed during the hospital encounter of 10/28/17 (from the past 24 hour(s))  Comprehensive metabolic panel   Collection Time: 10/29/17  8:26 AM  Result Value Ref Range   Sodium 136 135 - 145 mmol/L   Potassium 4.1 3.5 - 5.1 mmol/L   Chloride 106 98 - 111 mmol/L   CO2 20 (L) 22 - 32 mmol/L   Glucose, Bld 76 70 - 99 mg/dL   BUN 13 6 - 20 mg/dL   Creatinine, Ser 4.09 0.44 - 1.00 mg/dL   Calcium 8.4 (L) 8.9 - 10.3 mg/dL   Total Protein  5.7 (L) 6.5 - 8.1 g/dL   Albumin 2.5 (L) 3.5 - 5.0 g/dL   AST 24 15 - 41 U/L   ALT 22 0 - 44 U/L   Alkaline Phosphatase 106 38 - 126 U/L   Total Bilirubin 0.4 0.3 - 1.2 mg/dL   GFR calc non Af Amer >60 >60 mL/min   GFR calc Af Amer >60 >60 mL/min   Anion gap 10 5 - 15  CBC with Differential/Platelet   Collection Time: 10/29/17  8:26 AM  Result Value Ref Range   WBC 11.1 (H) 4.0 - 10.5 K/uL   RBC 3.37 (L) 3.87 - 5.11 MIL/uL   Hemoglobin 10.3 (L) 12.0 - 15.0 g/dL   HCT 81.1 (L) 91.4 - 78.2 %   MCV 91.7 78.0 - 100.0 fL   MCH 30.6 26.0 - 34.0 pg   MCHC 33.3 30.0 - 36.0 g/dL   RDW 95.6 21.3 - 08.6 %   Platelets 227 150 - 400 K/uL   Neutrophils Relative % 68 %   Neutro Abs 7.5 1.7 - 7.7 K/uL   Lymphocytes Relative 24 %   Lymphs Abs 2.7 0.7 - 4.0 K/uL   Monocytes Relative 8 %   Monocytes Absolute 0.9 0.1 - 1.0 K/uL   Eosinophils Relative  0 %   Eosinophils Absolute 0.0 0.0 - 0.7 K/uL   Basophils Relative 0 %   Basophils Absolute 0.0 0.0 - 0.1 K/uL    Imaging Studies:      Medications:  Scheduled . docusate sodium  100 mg Oral Daily  . famotidine  20 mg Oral BID  . levothyroxine  100 mcg Oral QAC breakfast  . prenatal multivitamin  1 tablet Oral Q1200   I have reviewed the patient's current medications.  ASSESSMENT: G1P0 [redacted]w[redacted]d Estimated Date of Delivery: 12/13/17  Mild pre eclampsia with 2 episodes of severe pressures   Patient Active Problem List   Diagnosis Date Noted  . 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 GBS (group B Streptococcus carrier), +RV culture, currently pregnant 10/27/2017  . Malpresentation before onset of labor 10/26/2017  . Rh negative state in antepartum period 10/26/2017  . Supervision of high risk pregnancy due to social problems 10/26/2017  . Obesity in pregnancy 10/26/2017  . BMI 30s 10/26/2017  . Hypothyroidism   . Pre-eclampsia, severe, antepartum 10/24/2017  . Supervision of high risk pregnancy, antepartum 10/10/2017  . Late  prenatal care 10/10/2017  . Vitamin D deficiency 06/30/2015  . ADD (attention deficit disorder) 05/23/2014    PLAN: >continue in house observation for now >exact status of her hypertensive disorder is a big muddy, she definitely has mild pre eclampsia with BP and proteinuria. Her other labs are all normal and she has no CNS symptoms.  She had elevated pressure when she came in the first time had 1 dose of IV labetalol and for 24 hours never had a pre eclamptic range BP.  I sent her home, she went to the pharmacy to buy her home BP cuff I recommended, she had acute esophogeal spasm and came back in and of course she had an elevated BP required IV apresoline but has had no more severe range pressures and not been getting any scheduled BP meds.  So as of right now I don't think she is truly severe pre eclampsia but of course her clinical course may evole.  Given her current BP profile I do not think IOL at 34 weeks is warranted  Jill Evans H Jill Evans 10/30/2017,7:08 AM

## 2017-10-30 NOTE — Progress Notes (Signed)
   10/29/17 2359  Provider Notification  Provider Name/Title Dr. Despina Hidden  Method of Notification Phone  Notification Reason Uterine activity  Pt contracting every 3-4, not feeling them. Dr. Despina Hidden notified. No new orders. Ok to take pt off monitor per MD

## 2017-10-30 NOTE — Plan of Care (Signed)
  Problem: Education: Goal: Knowledge of disease or condition will improve Outcome: Progressing   Problem: Education: Goal: Knowledge of disease or condition will improve Outcome: Progressing   

## 2017-10-31 ENCOUNTER — Encounter: Payer: BLUE CROSS/BLUE SHIELD | Admitting: Obstetrics and Gynecology

## 2017-10-31 ENCOUNTER — Other Ambulatory Visit: Payer: BLUE CROSS/BLUE SHIELD

## 2017-10-31 LAB — COMPREHENSIVE METABOLIC PANEL
ALK PHOS: 118 U/L (ref 38–126)
ALT: 19 U/L (ref 0–44)
AST: 19 U/L (ref 15–41)
Albumin: 2.2 g/dL — ABNORMAL LOW (ref 3.5–5.0)
Anion gap: 7 (ref 5–15)
BUN: 15 mg/dL (ref 6–20)
CHLORIDE: 106 mmol/L (ref 98–111)
CO2: 22 mmol/L (ref 22–32)
CREATININE: 0.47 mg/dL (ref 0.44–1.00)
Calcium: 8.6 mg/dL — ABNORMAL LOW (ref 8.9–10.3)
GFR calc Af Amer: >60 mL/min (ref >60)
Glucose, Bld: 100 mg/dL — ABNORMAL HIGH (ref 70–99)
Potassium: 3.8 mmol/L (ref 3.5–5.1)
Sodium: 135 mmol/L (ref 135–145)
Total Bilirubin: 0.5 mg/dL (ref 0.3–1.2)
Total Protein: 5.5 g/dL — ABNORMAL LOW (ref 6.5–8.1)

## 2017-10-31 LAB — CBC
HEMATOCRIT: 31.5 % — AB (ref 36.0–46.0)
Hemoglobin: 10.9 g/dL — ABNORMAL LOW (ref 12.0–15.0)
MCH: 31.1 pg (ref 26.0–34.0)
MCHC: 34.6 g/dL (ref 30.0–36.0)
MCV: 90 fL (ref 78.0–100.0)
PLATELETS: 224 10*3/uL (ref 150–400)
RBC: 3.5 MIL/uL — ABNORMAL LOW (ref 3.87–5.11)
RDW: 12.1 % (ref 11.5–15.5)
WBC: 11.8 10*3/uL — AB (ref 4.0–10.5)

## 2017-10-31 NOTE — Progress Notes (Signed)
Patient ID: Jill Evans, female   DOB: October 09, 1990, 27 y.o.   MRN: [id number] FACULTY PRACTICE ANTEPARTUM(COMPREHENSIVE) NOTE  Jill Evans is a 27 y.o. G1P0 at [redacted]w[redacted]d by best clinical estimate who is admitted for pre-eclampsia with some elevated BPs.   Fetal presentation is breech. Length of Stay:  3  Days  Subjective: Feels well. No complaints. Patient reports the fetal movement as active. Patient reports uterine contraction  activity as none. Patient reports  vaginal bleeding as none. Patient describes fluid per vagina as None.  Vitals:  Blood pressure 119/79, pulse 69, temperature 98.2 F (36.8 C), resp. rate 19, height 4\' 11"  (1.499 m), weight 77.1 kg, SpO2 97 %. Physical Examination:  General appearance - alert, well appearing, and in no distress Chest - normal effort Abdomen - gravid, non-tender Fundal Height:  size equals dates Extremities: Homans sign is negative, no sign of DVT  Membranes:intact  Fetal Monitoring:  Baseline: 130 bpm, Variability: Good {> 6 bpm), Accelerations: Reactive and Decelerations: Absent  Medications:  Scheduled . docusate sodium  100 mg Oral Daily  . famotidine  20 mg Oral BID  . levothyroxine  100 mcg Oral QAC breakfast  . prenatal multivitamin  1 tablet Oral Q1200  . sodium chloride flush  3 mL Intravenous Q12H   I have reviewed the patient's current medications.  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

## 2017-11-01 LAB — TYPE AND SCREEN
ABO/RH(D): B NEG
Antibody Screen: POSITIVE
Unit division: 0
Unit division: 0

## 2017-11-01 LAB — BPAM RBC
BLOOD PRODUCT EXPIRATION DATE: 201911042359
Blood Product Expiration Date: 201911042359
UNIT TYPE AND RH: 9500
Unit Type and Rh: 9500

## 2017-11-01 NOTE — Progress Notes (Signed)
Patient ID: Jill Evans, female   DOB: February 01, 1990, 27 y.o.   MRN: [id number] FACULTY PRACTICE ANTEPARTUM(COMPREHENSIVE) NOTE  Jill Evans is a 27 y.o. G1P0 at [redacted]w[redacted]d by best clinical estimate who is admitted for pre-eclampsia.   Fetal presentation is breech. Length of Stay:  4  Days  Subjective: Feels well Patient reports the fetal movement as active. Patient reports uterine contraction  activity as none. Patient reports  vaginal bleeding as none. Patient describes fluid per vagina as None.  Vitals:  Blood pressure (!) 143/94, pulse 65, temperature 97.9 F (36.6 C), resp. rate 18, height 4\' 11"  (1.499 m), weight 77.1 kg, SpO2 100 %. Physical Examination:  General appearance - alert, well appearing, and in no distress Chest - normal effort Abdomen - gravid, non-tender Fundal Height:  size equals dates Extremities: Homans sign is negative, no sign of DVT +2 edema Membranes:intact  Fetal Monitoring:  Baseline: 140 bpm, Variability: Good {> 6 bpm), Accelerations: Reactive and Decelerations: Absent   Medications:  Scheduled . docusate sodium  100 mg Oral Daily  . famotidine  20 mg Oral BID  . levothyroxine  100 mcg Oral QAC breakfast  . prenatal multivitamin  1 tablet Oral Q1200  . sodium chloride flush  3 mL Intravenous Q12H   I have reviewed the patient's current medications.  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 FHR is reassuring. Normal labs yesterday  Reva Bores, MD 11/01/2017,4:53 PM

## 2017-11-02 LAB — COMPREHENSIVE METABOLIC PANEL
ALT: 17 U/L (ref 0–44)
AST: 20 U/L (ref 15–41)
Albumin: 2.3 g/dL — ABNORMAL LOW (ref 3.5–5.0)
Alkaline Phosphatase: 117 U/L (ref 38–126)
Anion gap: 8 (ref 5–15)
BUN: 15 mg/dL (ref 6–20)
CO2: 20 mmol/L — AB (ref 22–32)
CREATININE: 0.45 mg/dL (ref 0.44–1.00)
Calcium: 8.2 mg/dL — ABNORMAL LOW (ref 8.9–10.3)
Chloride: 106 mmol/L (ref 98–111)
GFR calc Af Amer: >60 mL/min (ref >60)
GFR calc non Af Amer: >60 mL/min (ref >60)
Glucose, Bld: 82 mg/dL (ref 70–99)
POTASSIUM: 4.1 mmol/L (ref 3.5–5.1)
Sodium: 134 mmol/L — ABNORMAL LOW (ref 135–145)
Total Bilirubin: 0.4 mg/dL (ref 0.3–1.2)
Total Protein: 5.5 g/dL — ABNORMAL LOW (ref 6.5–8.1)

## 2017-11-02 LAB — CBC
HCT: 34.2 % — ABNORMAL LOW (ref 36.0–46.0)
HEMOGLOBIN: 11.8 g/dL — AB (ref 12.0–15.0)
MCH: 30.7 pg (ref 26.0–34.0)
MCHC: 34.5 g/dL (ref 30.0–36.0)
MCV: 89.1 fL (ref 78.0–100.0)
Platelets: 271 10*3/uL (ref 150–400)
RBC: 3.84 MIL/uL — ABNORMAL LOW (ref 3.87–5.11)
RDW: 12.2 % (ref 11.5–15.5)
WBC: 13.9 10*3/uL — ABNORMAL HIGH (ref 4.0–10.5)

## 2017-11-02 MED ORDER — OXYCODONE-ACETAMINOPHEN 5-325 MG PO TABS: 1.0000 | ORAL_TABLET | ORAL | Status: DC | PRN

## 2017-11-02 MED ORDER — MAGNESIUM SULFATE BOLUS VIA INFUSION
4.0000 g | Freq: Once | INTRAVENOUS | Status: AC
  Administered 2017-11-02: 4 g via INTRAVENOUS
  Filled 2017-11-02: qty 500

## 2017-11-02 MED ORDER — LACTATED RINGERS IV SOLN
500.0000 mL | INTRAVENOUS | Status: DC | PRN
  Administered 2017-11-05: 500 mL via INTRAVENOUS

## 2017-11-02 MED ORDER — OXYTOCIN BOLUS FROM INFUSION: 500.0000 mL | Freq: Once | INTRAVENOUS | Status: DC

## 2017-11-02 MED ORDER — SODIUM CHLORIDE 0.9 % IV SOLN
5.0000 10*6.[IU] | Freq: Once | INTRAVENOUS | Status: AC
  Administered 2017-11-02: 5 10*6.[IU] via INTRAVENOUS
  Filled 2017-11-02: qty 5

## 2017-11-02 MED ORDER — ONDANSETRON HCL 4 MG/2ML IJ SOLN
4.0000 mg | Freq: Four times a day (QID) | INTRAMUSCULAR | Status: DC | PRN
  Administered 2017-11-04 – 2017-11-05 (×5): 4 mg via INTRAVENOUS
  Filled 2017-11-02 (×5): qty 2

## 2017-11-02 MED ORDER — PENICILLIN G 3 MILLION UNITS IVPB - SIMPLE MED
3.0000 10*6.[IU] | INTRAVENOUS | Status: DC
  Administered 2017-11-02 – 2017-11-06 (×22): 3 10*6.[IU] via INTRAVENOUS
  Filled 2017-11-02 (×18): qty 100

## 2017-11-02 MED ORDER — MAGNESIUM SULFATE 40 G IN LACTATED RINGERS - SIMPLE
2.0000 g/h | INTRAVENOUS | Status: AC
  Administered 2017-11-03 – 2017-11-07 (×5): 2 g/h via INTRAVENOUS
  Filled 2017-11-02 (×6): qty 500

## 2017-11-02 MED ORDER — FLEET ENEMA 7-19 GM/118ML RE ENEM: 1.0000 | ENEMA | RECTAL | Status: DC | PRN

## 2017-11-02 MED ORDER — OXYTOCIN 40 UNITS IN LACTATED RINGERS INFUSION - SIMPLE MED
2.5000 [IU]/h | INTRAVENOUS | Status: DC
  Filled 2017-11-02: qty 1000

## 2017-11-02 MED ORDER — LIDOCAINE HCL (PF) 1 % IJ SOLN: 30.0000 mL | INTRAMUSCULAR | Status: DC | PRN

## 2017-11-02 MED ORDER — TERBUTALINE SULFATE 1 MG/ML IJ SOLN: 0.2500 mg | Freq: Once | INTRAMUSCULAR | Status: DC | PRN

## 2017-11-02 MED ORDER — OXYCODONE-ACETAMINOPHEN 5-325 MG PO TABS: 2.0000 | ORAL_TABLET | ORAL | Status: DC | PRN

## 2017-11-02 MED ORDER — ZOLPIDEM TARTRATE 5 MG PO TABS
5.0000 mg | ORAL_TABLET | Freq: Every evening | ORAL | Status: DC | PRN
  Administered 2017-11-02 – 2017-11-03 (×2): 5 mg via ORAL
  Filled 2017-11-02 (×2): qty 1

## 2017-11-02 MED ORDER — MISOPROSTOL 50MCG HALF TABLET
50.0000 ug | ORAL_TABLET | ORAL | Status: DC | PRN
  Administered 2017-11-02 – 2017-11-04 (×7): 50 ug via ORAL
  Filled 2017-11-02 (×8): qty 1

## 2017-11-02 MED ORDER — ACETAMINOPHEN 325 MG PO TABS
650.0000 mg | ORAL_TABLET | ORAL | Status: DC | PRN
  Administered 2017-11-03 – 2017-11-05 (×4): 650 mg via ORAL
  Filled 2017-11-02 (×4): qty 2

## 2017-11-02 MED ORDER — SOD CITRATE-CITRIC ACID 500-334 MG/5ML PO SOLN
30.0000 mL | ORAL | Status: DC | PRN
  Administered 2017-11-06: 30 mL via ORAL
  Filled 2017-11-02: qty 15

## 2017-11-02 MED ORDER — LACTATED RINGERS IV SOLN
INTRAVENOUS | Status: DC
  Administered 2017-11-02 – 2017-11-05 (×6): via INTRAVENOUS

## 2017-11-02 MED ORDER — MISOPROSTOL 25 MCG QUARTER TABLET
25.0000 ug | ORAL_TABLET | ORAL | Status: DC | PRN
  Administered 2017-11-02 (×2): 25 ug via VAGINAL
  Filled 2017-11-02 (×3): qty 1

## 2017-11-02 NOTE — Progress Notes (Signed)
Pt mom expressed issues with patient adoption plan-concerns about if patients best interest are considered-pt mom expressed concerns over patient having special needs and feels that she could be taken advantage of by potential adoptive parents and FOB-concerns from pt mom about no contact with FOB and his lack of support for patient-SW consult placed

## 2017-11-02 NOTE — Progress Notes (Signed)
LABOR PROGRESS NOTE  Jill Evans is a 27 y.o. G1P0 at [redacted]w[redacted]d  admitted for IOL for Severe Pre-E.   Subjective: Patient transferred from ANTE this AM for induction. She is feeling well, not feeling any ctx. Denies HA and vision changes.   Objective: BP (!) 154/97   Pulse 78   Temp 98.2 F (36.8 C) (Oral)   Resp 16   Ht 4\' 11"  (1.499 m)   Wt 77.1 kg   SpO2 99%   BMI 34.34 kg/m  or  Vitals:   11/02/17 0904 11/02/17 1108 11/02/17 1118 11/02/17 1200  BP: (!) 143/84 136/85 127/77 (!) 154/97  Pulse: 66 78 82 78  Resp:  16 16 16   Temp:  98.2 F (36.8 C)    TempSrc:  Oral    SpO2:      Weight:      Height:        Dilation: Closed Effacement (%): Thick Station: -3 Presentation: Vertex Exam by:: J.Thornton, rn FHT: baseline rate 125, moderate varibility, +acel, no decel Toco: Irritability   Labs: Lab Results  Component Value Date   WBC 13.9 (H) 11/02/2017   HGB 11.8 (L) 11/02/2017   HCT 34.2 (L) 11/02/2017   MCV 89.1 11/02/2017   PLT 271 11/02/2017    Patient Active Problem List   Diagnosis Date Noted  . 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 GBS (group B Streptococcus carrier), +RV culture, currently pregnant 10/27/2017  . Malpresentation before onset of labor 10/26/2017  . Rh negative state in antepartum period 10/26/2017  . Supervision of high risk pregnancy due to social problems 10/26/2017  . Obesity in pregnancy 10/26/2017  . BMI 30s 10/26/2017  . Hypothyroidism   . Pre-eclampsia, severe, antepartum 10/24/2017  . Supervision of high risk pregnancy, antepartum 10/10/2017  . Late prenatal care 10/10/2017  . Vitamin D deficiency 06/30/2015  . ADD (attention deficit disorder) 05/23/2014    Assessment / Plan: 27 y.o. G1P0 at [redacted]w[redacted]d here for IOL for severe Pre-E.   Severe Pre-E: No recent severe range BPs. Has received Labetalol IV this AM. Currently on Mg. Repeat HELLP labs WNL.   Labor: Induction. Preterm status--have asked for NICU consult.   Fetal Wellbeing:  Cat I  Pain Control:  Patient unsure about if planning for epidural. Have discussed. Patient to consider.  Anticipated MOD:  NSVD  BUFA: Adoptive family involved. Will be present for NICU consult. SW consult PP.   Marcy Siren, D.O. OB Fellow  11/02/2017, 12:28 PM

## 2017-11-02 NOTE — Anesthesia Pain Management Evaluation Note (Signed)
3 CRNA Pain Management Visit Note  Patient: Jill Evans, 27 y.o., female  "Hello I am a member of the anesthesia team at [facility]. We have an anesthesia team available at all times to provide care throughout the hospital, including epidural management and anesthesia for C-section. I don't know your plan for the delivery whether it a natural birth, water birth, IV sedation, nitrous supplementation, doula or epidural, but we want to meet your pain goals."   1.Was your pain managed to your expectations on prior hospitalizations?   No prior hospitalizations  2.What is your expectation for pain management during this hospitalization?     Labor support without medications, Epidural and IV pain meds  3.How can we help you reach that goal? *support**  Record the patient's initial score and the patient's pain goal.   Pain: 0  Pain Goal: 8 [facility] wants you to be able to say your pain was always managed very well.  Trellis Paganini 11/02/2017

## 2017-11-02 NOTE — Progress Notes (Signed)
Report given to L&D.

## 2017-11-02 NOTE — Progress Notes (Signed)
Patient ID: Jill Evans, female   DOB: 1991/01/07, 27 y.o.   MRN: [id number] FACULTY PRACTICE ANTEPARTUM(COMPREHENSIVE) NOTE  Jill Evans is a 27 y.o. G1P0 at [redacted]w[redacted]d by best clinical estimate who is admitted for pre-eclampsia.   Fetal presentation is cephalic. Length of Stay:  5  Days  Subjective: On arrival to unit--RN reports that patient has required 20, 40 80 mg of Labetalol to control BP's this am. BP was 174-104 at highest. Has had 2 prior BPs in the 164/99 Patient reports the fetal movement as active. Patient reports uterine contraction  activity as none. Patient reports  vaginal bleeding as none. Patient describes fluid per vagina as None.  Vitals:  Blood pressure (!) 143/84, pulse 66, temperature 97.8 F (36.6 C), temperature source Oral, resp. rate 18, height 4\' 11"  (1.499 m), weight 77.1 kg, SpO2 99 %. Physical Examination:  General appearance - alert, well appearing, and in no distress Chest - normal effort Abdomen - gravid, non-tender Fundal Height:  size equals dates Extremities: Homans sign is negative, no sign of DVT  Membranes:intact  Fetal Monitoring:  Baseline: 140 bpm, Variability: Good {> 6 bpm), Accelerations: Reactive and Decelerations: Absent  Medications:  Scheduled . docusate sodium  100 mg Oral Daily  . famotidine  20 mg Oral BID  . levothyroxine  100 mcg Oral QAC breakfast  . prenatal multivitamin  1 tablet Oral Q1200  . sodium chloride flush  3 mL Intravenous Q12H   I have reviewed the patient's current medications.  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 She is now vtx. Repeat labs, magnesium, PCN. Discussed with BS staff and attending.  Reva Bores, MD 11/02/2017,9:14 AM

## 2017-11-02 NOTE — Progress Notes (Signed)
LABOR PROGRESS NOTE  DARIONA POSTMA is a 27 y.o. G1P0 at [redacted]w[redacted]d  admitted for IOL for Pre-e with severe features.   Subjective: Doing well, feeling some contractions but comfortable. Denies HA or vision changes.   Objective: BP 128/71   Pulse 77   Temp (!) 97.5 F (36.4 C) (Oral)   Resp 16   Ht 4\' 11"  (1.499 m)   Wt 77.1 kg   SpO2 99%   BMI 34.34 kg/m  or  Vitals:   11/02/17 1600 11/02/17 1700 11/02/17 1800 11/02/17 1859  BP: (!) 141/83 (!) 145/95 107/85 128/71  Pulse: 85 78 78 77  Resp: 16 16 16 16   Temp:   (!) 97.5 F (36.4 C)   TempSrc:   Oral   SpO2:      Weight:      Height:        Dilation: Closed Effacement (%): 30 Cervical Position: Middle Station: -3 Presentation: Vertex Exam by:: Dr.Wallace FHT: baseline rate 130, moderate varibility, neg acel, neg decel Toco: irritable   Labs: Lab Results  Component Value Date   WBC 13.9 (H) 11/02/2017   HGB 11.8 (L) 11/02/2017   HCT 34.2 (L) 11/02/2017   MCV 89.1 11/02/2017   PLT 271 11/02/2017    Patient Active Problem List   Diagnosis Date Noted  . 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 GBS (group B Streptococcus carrier), +RV culture, currently pregnant 10/27/2017  . Malpresentation before onset of labor 10/26/2017  . Rh negative state in antepartum period 10/26/2017  . Supervision of high risk pregnancy due to social problems 10/26/2017  . Obesity in pregnancy 10/26/2017  . BMI 30s 10/26/2017  . Hypothyroidism   . Pre-eclampsia, severe, antepartum 10/24/2017  . Supervision of high risk pregnancy, antepartum 10/10/2017  . Late prenatal care 10/10/2017  . Vitamin D deficiency 06/30/2015  . ADD (attention deficit disorder) 05/23/2014    Assessment / Plan: 27 y.o. G1P0 at 108w1d here for IOL for pre-e with severe features. Currently stable.   Labor: Minimal progression, s/p vaginal cytotec x2. Start cytotec buccal.  Fetal Wellbeing:  Cat 1 strip, NICU to be present at delivery  Pain Control:   Undecided.  Anticipated MOD:  NSVD  Pre-E with severe features: BP stable, 120-140's systolic. Mg. IV labetalol.   Leticia Penna, D.O. Family Medicine PGY-1  11/02/2017, 7:13 PM

## 2017-11-02 NOTE — Consult Note (Signed)
Asked by Dr Wallace/Dr Shawnie Pons to consult on Ms Gielow for expected delivery of 34 wk preterm infant. Chart reviewed.  She is 34 1/7 gestation. Rh- treated with Rhogam. She has received 2 doses of betamethasone on 9/26, 9/27. Labor is being induced for severe preeclampsia.  She is also GBS +, planned to be treated with Pen G.  Soc: Mom has partial deletion of Chrom 13. There is a plan for the baby to be adopted but papers are not fully executed.  I spoke to Ms Nath in her room. Her mom and the adoptive mother were present. She approved of both of them being present during this conversation. I discussed our presence at delivery. I discussed assessment of infant's breathing and giving resp support if needed. I also discussed the expectation of admission to NICU after birth, the most consistent reason is to support the baby's nutrition. I talked about approx length of stay until the baby is mature to feed well on his own and gain weight.  I answered her questions fully.  Thank you for inviting [place] to be a part of Ms Bazen's care.  Lucillie Garfinkel MD  Neonatologist

## 2017-11-03 LAB — COMPREHENSIVE METABOLIC PANEL
ALT: 19 U/L (ref 0–44)
ANION GAP: 10 (ref 5–15)
AST: 20 U/L (ref 15–41)
Albumin: 2.3 g/dL — ABNORMAL LOW (ref 3.5–5.0)
Alkaline Phosphatase: 122 U/L (ref 38–126)
BILIRUBIN TOTAL: 0.5 mg/dL (ref 0.3–1.2)
BUN: 11 mg/dL (ref 6–20)
CO2: 20 mmol/L — ABNORMAL LOW (ref 22–32)
Calcium: 7 mg/dL — ABNORMAL LOW (ref 8.9–10.3)
Chloride: 103 mmol/L (ref 98–111)
Creatinine, Ser: 0.48 mg/dL (ref 0.44–1.00)
GFR calc non Af Amer: >60 mL/min (ref >60)
Glucose, Bld: 104 mg/dL — ABNORMAL HIGH (ref 70–99)
Potassium: 4 mmol/L (ref 3.5–5.1)
Sodium: 133 mmol/L — ABNORMAL LOW (ref 135–145)
TOTAL PROTEIN: 6.2 g/dL — AB (ref 6.5–8.1)

## 2017-11-03 LAB — CBC
HEMATOCRIT: 35.9 % — AB (ref 36.0–46.0)
Hemoglobin: 12.2 g/dL (ref 12.0–15.0)
MCH: 30.7 pg (ref 26.0–34.0)
MCHC: 34 g/dL (ref 30.0–36.0)
MCV: 90.2 fL (ref 78.0–100.0)
Platelets: 235 10*3/uL (ref 150–400)
RBC: 3.98 MIL/uL (ref 3.87–5.11)
RDW: 12.3 % (ref 11.5–15.5)
WBC: 13 10*3/uL — ABNORMAL HIGH (ref 4.0–10.5)

## 2017-11-03 LAB — RPR: RPR Ser Ql: NONREACTIVE

## 2017-11-03 MED ORDER — FENTANYL CITRATE (PF) 100 MCG/2ML IJ SOLN
100.0000 ug | INTRAMUSCULAR | Status: DC | PRN
  Administered 2017-11-03 – 2017-11-04 (×6): 100 ug via INTRAVENOUS
  Filled 2017-11-03 (×6): qty 2

## 2017-11-03 MED ORDER — MISOPROSTOL 25 MCG QUARTER TABLET
25.0000 ug | ORAL_TABLET | ORAL | Status: DC
  Administered 2017-11-03 – 2017-11-04 (×4): 25 ug via VAGINAL
  Filled 2017-11-03 (×4): qty 1

## 2017-11-03 NOTE — Progress Notes (Signed)
CSW met with MOB in room 166.  CSW requested that MOB contact MOB's mother Christionna Poland, and FOB  Vergia Alcon and request that they come to meet with CSW to discuss adoption plan.  MOB attempted twice to contact them via phone with no answer.   CSW inquired about MOB's adoption plan. MOB communicated that initially MOB and FOB were going to pursue a private adoption ([employer]; Albertha Ghee) however, they decided to allow FOB's coworker Algis Greenhouse and Roberto Scales [phone]) to pursue adopting MOB and FOB's baby. Per MOB, the Alvira Monday has an attorney but, MOB and FOB does not.  CSW explained adoption process, and it was evidence by MOB's responses and facial expressions, MOB was not knowledgeable about the process for a private adoption. MOB reported having financial difficulty and is unable to obtain an attorney.  MOB expressed the possibility of needing assistance from a private adoption agency if either family is unable to afford an attorney to assist with initiating and finalizing adoption plan.   CSW expressed concerns about MOB's adoption plan and has requested that weekend CSW follow-up with family to complete a clinical assessment after L&D.  Laurey Arrow, MSW, LCSW Clinical Social Work [phone]

## 2017-11-03 NOTE — Progress Notes (Signed)
Labor Progress Note Jill Evans is a 27 y.o. G1P0 at [redacted]w[redacted]d presented for IOL for PEC w/severe features  S:  Comfortable but feeling stressed d/t issues with the adoption process  O:  BP (!) 139/96   Pulse 87   Temp 98.7 F (37.1 C) (Oral)   Resp 20   Ht 4\' 11"  (1.499 m)   Wt 77.1 kg   SpO2 96%   BMI 34.34 kg/m  EFM: baseline 130 bpm/ md variability/ + accels/ no decels  Toco: 2-5 SVE: deferred   A/P: 27 y.o. G1P0 [redacted]w[redacted]d  1. Labor: latent 2. FWB: Cat I  3. Pain: analgesia/anesthesia prn 4. PEC: BP labile, IV Labetalol prn  Continue Cytotec, place foley when able. Anticipate labor progression and SVD. SW consult now.  Donette Larry, CNM 11:06 AM

## 2017-11-03 NOTE — Progress Notes (Signed)
Jill Evans is a 27 y.o. G1P0 at [redacted]w[redacted]d admitted for induction of labor due to Preeclampsia.  Subjective: Resting comfortably in bed with FOB at bedside. Asking about bowel movement, requesting Colace  Objective: BP (!) 129/101   Pulse 84   Temp 99.1 F (37.3 C)   Resp 16   Ht 4\' 11"  (1.499 m)   Wt 77.1 kg   SpO2 96%   BMI 34.34 kg/m  I/O last 3 completed shifts: In: 12546.1 [P.O.:2895; I.V.:3771.1; IV Piggyback:5880] Out: 6075 [Urine:6075] Total I/O In: 1223.6 [P.O.:480; I.V.:343.6; IV Piggyback:400] Out: 1100 [Urine:1100]  FHT:  FHR: 130 bpm, variability: moderate,  accelerations:  Present,  decelerations:  Absent UC:   rare SVE:   Dilation: Closed Effacement (%): Thick Station: Ballotable Exam by:: Thalia Bloodgood, CNM  Labs: Lab Results  Component Value Date   WBC 13.0 (H) 11/03/2017   HGB 12.2 11/03/2017   HCT 35.9 (L) 11/03/2017   MCV 90.2 11/03/2017   PLT 235 11/03/2017    Assessment / Plan: Induction of labor due to preeclampsia,   buccal Cytotec now Recheck in 4 hours, Foley bulb to Pit PRN Labor: s/p 8 administrations of Cytotec. Cervix remains closed Preeclampsia:  on magnesium sulfate, no signs or symptoms of toxicity and intake and ouput balanced Fetal Wellbeing:  Category I Pain Control:  Planning epidural I/D:  n/a Anticipated MOD:  NSVD  Calvert Cantor, CNM 11/03/2017, 10:57 PM

## 2017-11-03 NOTE — Plan of Care (Signed)
  Problem: Health Behavior/Discharge Planning: Goal: Ability to manage health-related needs will improve Outcome: Progressing   Problem: Clinical Measurements: Goal: Ability to maintain clinical measurements within normal limits will improve Outcome: Progressing Goal: Will remain free from infection Outcome: Progressing Goal: Diagnostic test results will improve Outcome: Progressing Goal: Respiratory complications will improve Outcome: Progressing Goal: Cardiovascular complication will be avoided Outcome: Progressing   Problem: Elimination: Goal: Will not experience complications related to bowel motility Outcome: Progressing Goal: Will not experience complications related to urinary retention Outcome: Progressing   Problem: Pain Managment: Goal: General experience of comfort will improve Outcome: Progressing   Problem: Skin Integrity: Goal: Risk for impaired skin integrity will decrease Outcome: Progressing   Problem: Education: Goal: Knowledge of disease or condition will improve Outcome: Progressing Goal: Knowledge of the prescribed therapeutic regimen will improve Outcome: Progressing   Problem: Physical Regulation: Goal: Complications related to the disease process, condition or treatment will be avoided or minimized Outcome: Progressing   Problem: Pain Management: Goal: Relief or control of pain will improve Outcome: Progressing   Problem: Education: Goal: Knowledge of disease or condition will improve Outcome: Progressing Goal: Knowledge of the prescribed therapeutic regimen will improve Outcome: Progressing   Problem: Fluid Volume: Goal: Peripheral tissue perfusion will improve Outcome: Progressing   Problem: Clinical Measurements: Goal: Complications related to disease process, condition or treatment will be avoided or minimized Outcome: Progressing   Problem: Coping: Goal: Ability to verbalize concerns and feelings about labor and delivery will  improve Outcome: Progressing   Problem: Life Cycle: Goal: Ability to make normal progression through stages of labor will improve Outcome: Progressing Goal: Ability to effectively push during vaginal delivery will improve Outcome: Progressing   Problem: Role Relationship: Goal: Ability to demonstrate positive interaction with the child will improve Outcome: Progressing   Problem: Safety: Goal: Risk of complications during labor and delivery will decrease Outcome: Progressing   Problem: Pain Management: Goal: Relief or control of pain from uterine contractions will improve Outcome: Progressing

## 2017-11-03 NOTE — Progress Notes (Signed)
LABOR PROGRESS NOTE  Jill Evans is a 27 y.o. G1P0 at [redacted]w[redacted]d  admitted for IOL for PEC with severe features.   Subjective: Doing well. No complaints and comfortable.   Objective: BP (!) 138/91   Pulse 79   Temp 98.7 F (37.1 C) (Oral)   Resp 20   Ht 4\' 11"  (1.499 m)   Wt 77.1 kg   SpO2 96%   BMI 34.34 kg/m  or  Vitals:   11/03/17 1130 11/03/17 1200 11/03/17 1230 11/03/17 1300  BP: 134/88 134/89 130/78 (!) 138/91  Pulse: 86 88 82 79  Resp:      Temp:      TempSrc:      SpO2:      Weight:      Height:        Dilation: Closed Effacement (%): Thick Cervical Position: Middle Station: Ballotable Presentation: Vertex Exam by:: Melanie CNM FHT: baseline rate 130s, moderate varibility, +acel, -decel Toco: Irregular   Labs: Lab Results  Component Value Date   WBC 13.0 (H) 11/03/2017   HGB 12.2 11/03/2017   HCT 35.9 (L) 11/03/2017   MCV 90.2 11/03/2017   PLT 235 11/03/2017    Patient Active Problem List   Diagnosis Date Noted  . 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 GBS (group B Streptococcus carrier), +RV culture, currently pregnant 10/27/2017  . Malpresentation before onset of labor 10/26/2017  . Rh negative state in antepartum period 10/26/2017  . Supervision of high risk pregnancy due to social problems 10/26/2017  . Obesity in pregnancy 10/26/2017  . BMI 30s 10/26/2017  . Hypothyroidism   . Pre-eclampsia, severe, antepartum 10/24/2017  . Supervision of high risk pregnancy, antepartum 10/10/2017  . Late prenatal care 10/10/2017  . Vitamin D deficiency 06/30/2015  . ADD (attention deficit disorder) 05/23/2014    Assessment / Plan: 27 y.o. G1P0 at [redacted]w[redacted]d here for IOL for PEC with severe features.   Labor: IOL, latent. No change since last evaluation. Continue cytotec, not able to place FB yet.  Fetal Wellbeing:  Cat 1 strip  Pain Control:  Well controlled currently  Anticipated MOD:  NSVD  1. PEC with severe features: Stable, BP currently  138/91. IV labetalol PRN. Cr, LFTs, and platelets this am wnl.  2. Complex social situation: SW involved.   Leticia Penna, D.O. Family Medicine PGY-1   11/03/2017, 1:26 PM

## 2017-11-03 NOTE — Progress Notes (Signed)
I offered support and listening presence for greater than an hour as Jill Evans shared about her experience.  She feels very clear that making a plan for adoption is the best for her baby and sees her baby as growing up in a loving family.  Even though she wishes that she could provide that herself, she knows that she is not able to and is accepting of that.  She and FOB have been together for 10 years.  He is in agreement as well, although not as accepting as she is.  The concern is with what adoption plan to make.  She and FOB have family friends who have a 22 year old who are looking to adopt.  The idea of their baby going to a loving family that they know and whom they could see from time to time feels fitting, yet they only recently realized that the financial situation for this family was not as good as they had hoped.  Neither side can afford the lawyer needed for the legal adoption process.  She has also been working with an Barrister's clerk, but has not yet met any of the families.  She only started this process at 6 months because she did not know she was pregnant earlier.  She is receiving opionions from everyone in her family and at work about which choice to pick (family they know vs adoption agency).  She feels very torn and very stressed by the pressure of everyone trying to tell her what to do.  She is also then being told by the same people that because she can't manage her stress, her BP is going up and that only makes her more stressed.  I encouraged her at this time to focus on her labor ad delivery, knowing that she would have some time to make the other decisions after delivery since baby is preterm.  I reflected back to her that she was lovingly making a decision as a mother for her child.  I did state that going through with an adoption without a lawyer could become very messy for everyone involved.  But I encouraged her to make a decision and then know that she made that decision lovingly and stand  behind that decision as the baby's birth mother.  She was grateful to be able to talk and seemed less stressed when I left.  Jill Evans, Bcc Pager, [phone] 3:35 PM   11/03/17 1500  Clinical Encounter Type  Visited With Patient  Visit Type Spiritual support  Referral From Nurse  Spiritual Encounters  Spiritual Needs Emotional

## 2017-11-03 NOTE — Progress Notes (Signed)
Patient Vitals for the past 4 hrs:  BP Temp Temp src Pulse Resp  11/02/17 2348 - 98.1 F (36.7 C) Axillary - (!) 22  11/02/17 2300 (!) 152/99 - - 79 20  11/02/17 2200 (!) 149/102 - - 81 18  11/02/17 2102 (!) 148/96 - - 76 18   FHR Cat 1.  Cx still closed and firm. 3rd dose of cytotec given  Ctx q 2-5 minutes, mild. Will continue cytotec

## 2017-11-04 LAB — COMPREHENSIVE METABOLIC PANEL
ALK PHOS: 109 U/L (ref 38–126)
ALT: 17 U/L (ref 0–44)
ANION GAP: 11 (ref 5–15)
AST: 20 U/L (ref 15–41)
Albumin: 2.3 g/dL — ABNORMAL LOW (ref 3.5–5.0)
BUN: 13 mg/dL (ref 6–20)
CALCIUM: 7.3 mg/dL — AB (ref 8.9–10.3)
CO2: 22 mmol/L (ref 22–32)
Chloride: 103 mmol/L (ref 98–111)
Creatinine, Ser: 0.56 mg/dL (ref 0.44–1.00)
GLUCOSE: 87 mg/dL (ref 70–99)
Potassium: 4.3 mmol/L (ref 3.5–5.1)
Sodium: 136 mmol/L (ref 135–145)
Total Bilirubin: 0.3 mg/dL (ref 0.3–1.2)
Total Protein: 5.9 g/dL — ABNORMAL LOW (ref 6.5–8.1)

## 2017-11-04 LAB — CBC
HCT: 36.2 % (ref 36.0–46.0)
HCT: 36.2 % (ref 36.0–46.0)
HEMOGLOBIN: 12.1 g/dL (ref 12.0–15.0)
Hemoglobin: 12.2 g/dL (ref 12.0–15.0)
MCH: 30.6 pg (ref 26.0–34.0)
MCH: 30.8 pg (ref 26.0–34.0)
MCHC: 33.4 g/dL (ref 30.0–36.0)
MCHC: 33.7 g/dL (ref 30.0–36.0)
MCV: 91.4 fL (ref 78.0–100.0)
MCV: 91.6 fL (ref 78.0–100.0)
PLATELETS: 272 10*3/uL (ref 150–400)
Platelets: 241 10*3/uL (ref 150–400)
RBC: 3.95 MIL/uL (ref 3.87–5.11)
RBC: 3.96 MIL/uL (ref 3.87–5.11)
RDW: 12.2 % (ref 11.5–15.5)
RDW: 12.3 % (ref 11.5–15.5)
WBC: 12.4 10*3/uL — AB (ref 4.0–10.5)
WBC: 13.9 10*3/uL — ABNORMAL HIGH (ref 4.0–10.5)

## 2017-11-04 LAB — MAGNESIUM: Magnesium: 5.7 mg/dL — ABNORMAL HIGH (ref 1.7–2.4)

## 2017-11-04 MED ORDER — PROMETHAZINE HCL 25 MG/ML IJ SOLN
12.5000 mg | Freq: Once | INTRAMUSCULAR | Status: DC
  Filled 2017-11-04: qty 1

## 2017-11-04 MED ORDER — EPHEDRINE 5 MG/ML INJ: 10.0000 mg | INTRAVENOUS | Status: DC | PRN

## 2017-11-04 MED ORDER — PHENYLEPHRINE 40 MCG/ML (10ML) SYRINGE FOR IV PUSH (FOR BLOOD PRESSURE SUPPORT)
80.0000 ug | PREFILLED_SYRINGE | INTRAVENOUS | Status: DC | PRN
  Filled 2017-11-04: qty 10

## 2017-11-04 MED ORDER — OXYTOCIN 40 UNITS IN LACTATED RINGERS INFUSION - SIMPLE MED
1.0000 m[IU]/min | INTRAVENOUS | Status: DC
  Administered 2017-11-05: 6 m[IU]/min via INTRAVENOUS
  Administered 2017-11-05: 2 m[IU]/min via INTRAVENOUS
  Administered 2017-11-05: 4 m[IU]/min via INTRAVENOUS
  Filled 2017-11-04: qty 1000

## 2017-11-04 MED ORDER — LABETALOL HCL 200 MG PO TABS
300.0000 mg | ORAL_TABLET | Freq: Two times a day (BID) | ORAL | Status: DC
  Administered 2017-11-05 – 2017-11-06 (×4): 300 mg via ORAL
  Filled 2017-11-04 (×4): qty 1

## 2017-11-04 MED ORDER — TERBUTALINE SULFATE 1 MG/ML IJ SOLN: 0.2500 mg | Freq: Once | INTRAMUSCULAR | Status: DC | PRN

## 2017-11-04 MED ORDER — LACTATED RINGERS IV SOLN
500.0000 mL | Freq: Once | INTRAVENOUS | Status: AC
  Administered 2017-11-04: 500 mL via INTRAVENOUS

## 2017-11-04 MED ORDER — FENTANYL 2.5 MCG/ML BUPIVACAINE 1/10 % EPIDURAL INFUSION (WH - ANES)
14.0000 mL/h | INTRAMUSCULAR | Status: DC | PRN
  Administered 2017-11-05 – 2017-11-06 (×6): 14 mL/h via EPIDURAL
  Filled 2017-11-04 (×7): qty 100

## 2017-11-04 MED ORDER — DIPHENHYDRAMINE HCL 50 MG/ML IJ SOLN: 12.5000 mg | INTRAMUSCULAR | Status: DC | PRN

## 2017-11-04 MED ORDER — PHENYLEPHRINE 40 MCG/ML (10ML) SYRINGE FOR IV PUSH (FOR BLOOD PRESSURE SUPPORT): 80.0000 ug | PREFILLED_SYRINGE | INTRAVENOUS | Status: DC | PRN

## 2017-11-04 NOTE — Progress Notes (Addendum)
12pm: CSW met with Jill Evans, Social Worker from [place] to receive updates regarding adoption plan. Per Judson Roch, MOB and FOB have chosen a family through the agency to have their baby adopted by.   Jill Evans, CSW, and Jill Evans all discussed the potential for adoptive parents rooming in with infant after birth. CSW to obtain information regarding process.   CSW informed Jill Evans that per MOB's face sheet, she has [employer]. Jill Evans stated that MOB reported that she has filed for [facility] and the status is pending.  10am: CSW acknowledging consult for MOB with a plan for adoption. CSW reviewed chart, MOB is on magnesium sulfate and is still in L&D. CSW will meet with MOB post delivery to complete full assessment.  Madilyn Fireman, MSW, [place] Social Worker [facility] [phone]

## 2017-11-04 NOTE — Progress Notes (Signed)
Jill Evans is a 27 y.o. G1P0 at [redacted]w[redacted]d admitted for induction of labor due to Preeclampsia.  Subjective: No complaints or concerns  Objective: BP 102/80   Pulse (!) 139   Temp 97.9 F (36.6 C) (Oral)   Resp 14   Ht 4\' 11"  (1.499 m)   Wt 77.1 kg   SpO2 96%   BMI 34.34 kg/m  I/O last 3 completed shifts: In: 12546.1 [P.O.:2895; I.V.:3771.1; IV Piggyback:5880] Out: 6075 [Urine:6075] Total I/O In: 3127.9 [P.O.:480; I.V.:1031.3; IV Piggyback:1616.7] Out: 1100 [Urine:1100]  FHT:  FHR: 125 bpm, variability: moderate,  accelerations:  Present,  decelerations:  Absent UC:   irregular, every 2-4 minutes SVE:   Dilation: Closed Effacement (%): Thick Station: Ballotable Exam by:: L.Stubbs, RN  Labs: Lab Results  Component Value Date   WBC 13.0 (H) 11/03/2017   HGB 12.2 11/03/2017   HCT 35.9 (L) 11/03/2017   MCV 90.2 11/03/2017   PLT 235 11/03/2017    Assessment / Plan: Induction of labor due to preeclampsia,  progressing well on pitocin  Labor: Closed cervix. Buccal Cytotec at 0305. Place foley bulb when possible Preeclampsia:  on magnesium sulfate, no signs or symptoms of toxicity, intake and ouput balanced and labs stable Fetal Wellbeing:  Category I Pain Control:  Planning epidural in active labor I/D:  n/a Anticipated MOD:  NSVD  Calvert Cantor, CNM 11/04/2017, 3:14 AM

## 2017-11-04 NOTE — Progress Notes (Signed)
LABOR PROGRESS NOTE  CHARESE ABUNDIS is a 27 y.o. G1P0 at [redacted]w[redacted]d  admitted for IOL due to preeclampsia with severe features.   Subjective: Doing well, no complaints. Not really feeling any contractions.   Objective: BP (!) 144/89   Pulse 80   Temp 98.5 F (36.9 C) (Oral)   Resp 16   Ht 4\' 11"  (1.499 m)   Wt 77.1 kg   SpO2 96%   BMI 34.34 kg/m  or  Vitals:   11/04/17 0904 11/04/17 0930 11/04/17 1000 11/04/17 1100  BP: (!) 145/87 133/89 (!) 135/97 (!) 144/89  Pulse: 67 62 68 80  Resp: 16  16 16   Temp:      TempSrc:      SpO2:      Weight:      Height:        Dilation: Closed Effacement (%): Thick Cervical Position: Posterior Station: Ballotable Presentation: Undeterminable Exam by:: L.Stubbs, RN FHT: baseline rate 130s, moderate varibility, +acel, -decel Toco: irregular   Labs: Lab Results  Component Value Date   WBC 12.4 (H) 11/04/2017   HGB 12.1 11/04/2017   HCT 36.2 11/04/2017   MCV 91.6 11/04/2017   PLT 241 11/04/2017    Patient Active Problem List   Diagnosis Date Noted  . 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 GBS (group B Streptococcus carrier), +RV culture, currently pregnant 10/27/2017  . Malpresentation before onset of labor 10/26/2017  . Rh negative state in antepartum period 10/26/2017  . Supervision of high risk pregnancy due to social problems 10/26/2017  . Obesity in pregnancy 10/26/2017  . BMI 30s 10/26/2017  . Hypothyroidism   . Pre-eclampsia, severe, antepartum 10/24/2017  . Supervision of high risk pregnancy, antepartum 10/10/2017  . Late prenatal care 10/10/2017  . Vitamin D deficiency 06/30/2015  . ADD (attention deficit disorder) 05/23/2014    Assessment / Plan: 27 y.o. G1P0 at [redacted]w[redacted]d here for IOL due to preeclampsia with severe features. GBS positive, PCN.   Labor: IOL, placed FB via speculum exam. S/p cytotec x11, give buccal cytotec. Serial cervical exams.  Fetal Wellbeing:  Cat 1 strip Pain Control:  Well managed  currently  Anticipated MOD:  TBD, NSVD  Pre-e: Stable, last BP 144/89. LFTs, Cr, and plts wnl this am. Labetalol PRN. Cont Mg.   Leticia Penna, D.O. Family Medicine PGY-1 11/04/2017, 11:35 AM

## 2017-11-04 NOTE — Progress Notes (Addendum)
LABOR PROGRESS NOTE  Jill Evans is a 27 y.o. G1P0 at [redacted]w[redacted]d  admitted for IOL due to preeclampsia with severe features.  Subjective: Complaining of low back & hip pain. Endorses emesis earlier tonight consisting mainly of water she drank earlier (~700cc). Has not slept. But just ate and tolerated it. Still not feeling conctrations. Patient interested in obtaining epidural.   Objective: BP (!) 151/101   Pulse 79   Temp 98 F (36.7 C) (Oral)   Resp 18   Ht 4\' 11"  (1.499 m)   Wt 77.1 kg   SpO2 96%   BMI 34.34 kg/m  or  Vitals:   11/04/17 1901 11/04/17 2004 11/04/17 2026 11/04/17 2100  BP: 128/82 (!) 153/89 (!) 132/92 (!) 151/101  Pulse: 78 77 69 79  Resp: 18 18 18 18   Temp:   98 F (36.7 C)   TempSrc:   Oral   SpO2:      Weight:      Height:         Dilation: Closed Effacement (%): Thick Cervical Position: Posterior Station: -3 Presentation: Vertex Exam by:: Dr.Beard FHT: baseline rate 120, moderate variability, +accel, no decels Toco: irregular  Labs: Lab Results  Component Value Date   WBC 12.4 (H) 11/04/2017   HGB 12.1 11/04/2017   HCT 36.2 11/04/2017   MCV 91.6 11/04/2017   PLT 241 11/04/2017    Patient Active Problem List   Diagnosis Date Noted  . 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 GBS (group B Streptococcus carrier), +RV culture, currently pregnant 10/27/2017  . Malpresentation before onset of labor 10/26/2017  . Rh negative state in antepartum period 10/26/2017  . Supervision of high risk pregnancy due to social problems 10/26/2017  . Obesity in pregnancy 10/26/2017  . BMI 30s 10/26/2017  . Hypothyroidism   . Pre-eclampsia, severe, antepartum 10/24/2017  . Supervision of high risk pregnancy, antepartum 10/10/2017  . Late prenatal care 10/10/2017  . Vitamin D deficiency 06/30/2015  . ADD (attention deficit disorder) 05/23/2014    Assessment / Plan: 27 y.o. G1P0 at [redacted]w[redacted]d here for IOL due to preeclampsia with severe features. GBS  positive, PCN.   Labor: IOL, s/p cytotecx13, FB in, will start low-dose pit @2200  - stop at 72miliunits/min Fetal Wellbeing:  Cat 1 Pain Control:  Well managed currently, pt will discuss with anesthesia about dry cath epidural in anticipation of starting pitocin Anticipated MOD:  NSVD Pre-E: stable, last BP 151/101, LFTs, Cr (0.56) and plt (241) wnl this am. Labetalol PRN, cont. Mg (5.7 @ 1444).  Basilio Cairo, Medical Student 10/5/20199:47 PM   Resident Attestation   I saw and evaluated the patient, performing the key elements of the service.I  personally performed or re-performed the history, physical exam, and medical decision making activities of this service and have verified that the service and findings are accurately documented in the student's note. I developed the management plan that is described in the medical student's note, and I agree with the content, with my edits above.   Oralia Manis, DO, PGY-2 11/04/2017 10:20 PM

## 2017-11-04 NOTE — Consult Note (Signed)
Neonatology Consult to Antenatal Patient:  I was asked by Dr. Annia Friendly to see this patient in order to provide antenatal counseling to mom and new adoptive parents due to prematurity .  Ms. Jill Evans was admitted 10/28/17 at 33 3/[redacted] weeks GA for elevated BPs. She is currently undergoing IOL for severe pre-eclampsia. She has received BMZ 9/27, multiple PCN (+GBS) and is getting IV Mag. Mother with newly reported partial chromosome 13 deletion; Genetics has not been involved.  PNC just recently established.    I spoke with the patient, FOB, mGM, nurse, adoptive parents, and adoption agency SW. We discussed the worst case of delivery in the next 1-2 days, including usual DR management, possible respiratory complications and need for support, IV access, feedings, LOS, Mortality and Morbidity, and long term outcomes. She did not have any questions at this time. I would be glad to come back if they have more questions later.  Thank you for asking me to see this patient.  [name], MD Neonatologist  The total length of face-to-face or floor/unit time for this encounter was 25 minutes. Counseling and/or coordination of care was 40 minutes of the above.

## 2017-11-05 ENCOUNTER — Inpatient Hospital Stay (HOSPITAL_COMMUNITY): Payer: BLUE CROSS/BLUE SHIELD | Admitting: Anesthesiology

## 2017-11-05 MED ORDER — MISOPROSTOL 50MCG HALF TABLET
50.0000 ug | ORAL_TABLET | ORAL | Status: DC
  Administered 2017-11-05: 50 ug via ORAL

## 2017-11-05 MED ORDER — LIDOCAINE HCL (PF) 1 % IJ SOLN
INTRAMUSCULAR | Status: DC | PRN
  Administered 2017-11-05 (×2): 5 mL via EPIDURAL

## 2017-11-05 MED ORDER — MISOPROSTOL 25 MCG QUARTER TABLET
25.0000 ug | ORAL_TABLET | ORAL | Status: DC
  Filled 2017-11-05 (×5): qty 1

## 2017-11-05 NOTE — Progress Notes (Signed)
LABOR PROGRESS NOTE  Jill Evans is a 27 y.o. G1P0 at [redacted]w[redacted]d  admitted for IOL for CHTN SIPE   Subjective: Patient doing well, comfortable   Objective: BP 137/84   Pulse (!) 59   Temp 97.7 F (36.5 C) (Axillary)   Resp 18   Ht 4\' 11"  (1.499 m)   Wt 77.1 kg   SpO2 96%   BMI 34.34 kg/m  or  Vitals:   11/05/17 1900 11/05/17 1931 11/05/17 2000 11/05/17 2031  BP: (!) 148/97 135/83 (!) 154/91 137/84  Pulse: 72 (!) 55 66 (!) 59  Resp: 18 18 18 18   Temp:      TempSrc:      SpO2:      Weight:      Height:        FB placed @2210 , BBOW  Dilation: 2 Effacement (%): 70 Cervical Position: Middle Station: Ballotable Presentation: Vertex Exam by:: Steward Drone CNM   FHT: baseline rate 115, moderate varibility, +acel, no decel Toco: 5-6 minutes/ mild by palpation   Labs: Lab Results  Component Value Date   WBC 13.9 (H) 11/04/2017   HGB 12.2 11/04/2017   HCT 36.2 11/04/2017   MCV 91.4 11/04/2017   PLT 272 11/04/2017    Patient Active Problem List   Diagnosis Date Noted  . 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 GBS (group B Streptococcus carrier), +RV culture, currently pregnant 10/27/2017  . Malpresentation before onset of labor 10/26/2017  . Rh negative state in antepartum period 10/26/2017  . Supervision of high risk pregnancy due to social problems 10/26/2017  . Obesity in pregnancy 10/26/2017  . BMI 30s 10/26/2017  . Hypothyroidism   . Pre-eclampsia, severe, antepartum 10/24/2017  . Supervision of high risk pregnancy, antepartum 10/10/2017  . Late prenatal care 10/10/2017  . Vitamin D deficiency 06/30/2015  . ADD (attention deficit disorder) 05/23/2014    Assessment / Plan: 27 y.o. G1P0 at [redacted]w[redacted]d here for IOL for CHTN SIPE   Labor: FB replaced @2210  Fetal Wellbeing:  Cat I  Pain Control:  Epidural  Anticipated MOD:  SVD  Sharyon Cable, CNM 11/05/2017, 10:16 PM

## 2017-11-05 NOTE — Progress Notes (Signed)
Patient ID: Jill Evans, female   DOB: October 21, 1990, 27 y.o.   MRN: [id number] Doing well, comfortable  Foley bulb came out a short while ago  Vitals:   11/05/17 1231 11/05/17 1300 11/05/17 1331 11/05/17 1401  BP: 130/85 (!) 146/92 139/89 128/78  Pulse: 67 69 68 73  Resp:      Temp:  97.7 F (36.5 C)    TempSrc:  Oral    SpO2:      Weight:      Height:       FHR reassuring, but in sleep cycle now, non reactive UCs not tracing well.   Consider AROM and IUPC   Aviva Signs, CNM

## 2017-11-05 NOTE — Progress Notes (Signed)
LABOR PROGRESS NOTE  Jill Evans is a 27 y.o. G1P0 at [redacted]w[redacted]d  admitted for IOL for preE with severe features  Subjective: Patient stating she is doing much better and has been able to rest more. No longer having headaches.   Objective: BP 108/71   Pulse 68   Temp 98.3 F (36.8 C) (Oral)   Resp 18   Ht 4\' 11"  (1.499 m)   Wt 77.1 kg   SpO2 96%   BMI 34.34 kg/m  or  Vitals:   11/05/17 0230 11/05/17 0300 11/05/17 0330 11/05/17 0400  BP: (!) 104/59 108/63 108/71   Pulse: 67 63 68   Resp: 18 18 18    Temp:    98.3 F (36.8 C)  TempSrc:    Oral  SpO2:      Weight:      Height:        Dilation: 2 Effacement (%): 50 Cervical Position: Posterior Station: -3 Presentation: Vertex Exam by:: Gwendolyn Grant, RN  FHT: baseline rate 125, moderate varibility, +acel, -decel Toco: irregular   Labs: Lab Results  Component Value Date   WBC 13.9 (H) 11/04/2017   HGB 12.2 11/04/2017   HCT 36.2 11/04/2017   MCV 91.4 11/04/2017   PLT 272 11/04/2017    Patient Active Problem List   Diagnosis Date Noted  . 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 GBS (group B Streptococcus carrier), +RV culture, currently pregnant 10/27/2017  . Malpresentation before onset of labor 10/26/2017  . Rh negative state in antepartum period 10/26/2017  . Supervision of high risk pregnancy due to social problems 10/26/2017  . Obesity in pregnancy 10/26/2017  . BMI 30s 10/26/2017  . Hypothyroidism   . Pre-eclampsia, severe, antepartum 10/24/2017  . Supervision of high risk pregnancy, antepartum 10/10/2017  . Late prenatal care 10/10/2017  . Vitamin D deficiency 06/30/2015  . ADD (attention deficit disorder) 05/23/2014    Assessment / Plan: 27 y.o. G1P0 at [redacted]w[redacted]d here for IOL for preE with severe features  Labor: s/p cytotec x13, FB in, Pitocin @ 41miliunits/min Fetal Wellbeing:  Cat 1  I/D: GBS positive - PCN  Pain Control:  Epidural  Anticipated MOD:  NSVD Pre-E: last BP 127/84,  labetalol PRN, continue Mg  Oralia Manis, DO PGY-2 11/05/2017, 4:27 AM

## 2017-11-05 NOTE — Progress Notes (Signed)
Patient ID: Jill Evans, female   DOB: 1990-10-13, 27 y.o.   MRN: [id number] Resting after bath Comfortable with epidural, but legs are itching  Vitals:   11/05/17 0630 11/05/17 0700 11/05/17 0731 11/05/17 0800  BP: 122/81 136/88 (!) 137/99   Pulse: 70 73 76   Resp: 18 18 18 18   Temp:      TempSrc:      SpO2:      Weight:      Height:       FHR reassuring UCs not tracing well  Dilation: 3 Effacement (%): 70, 80 Cervical Position: Middle Station: -2, -1 Presentation: Vertex Exam by:: Milus Glazier RN  Foley still in place  Will observe for progression, RN is advancing Pitocin

## 2017-11-05 NOTE — Anesthesia Preprocedure Evaluation (Signed)
Anesthesia Evaluation  Patient identified by MRN, date of birth, ID band Patient awake    Reviewed: Allergy & Precautions, H&P , NPO status , Patient's Chart, lab work & pertinent test results  History of Anesthesia Complications Negative for: history of anesthetic complications  Airway Mallampati: II  TM Distance: >3 FB Neck ROM: full    Dental no notable dental hx. (+) Teeth Intact   Pulmonary neg pulmonary ROS,    Pulmonary exam normal breath sounds clear to auscultation       Cardiovascular hypertension (PIH), Normal cardiovascular exam Rhythm:regular Rate:Normal  ECG: SB, rate 59   Neuro/Psych PSYCHIATRIC DISORDERS ADD (attention deficit disorder)negative neurological ROS     GI/Hepatic negative GI ROS, Neg liver ROS,   Endo/Other  Hypothyroidism 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  Renal/GU negative Renal ROS  negative genitourinary   Musculoskeletal   Abdominal (+) + obese,   Peds  Hematology negative hematology ROS (+)   Anesthesia Other Findings   Reproductive/Obstetrics (+) Pregnancy                             Anesthesia Physical Anesthesia Plan  ASA: III  Anesthesia Plan: Epidural   Post-op Pain Management:    Induction:   PONV Risk Score and Plan:   Airway Management Planned:   Additional Equipment:   Intra-op Plan:   Post-operative Plan:   Informed Consent: I have reviewed the patients History and Physical, chart, labs and discussed the procedure including the risks, benefits and alternatives for the proposed anesthesia with the patient or authorized representative who has indicated his/her understanding and acceptance.     Plan Discussed with:   Anesthesia Plan Comments:         Anesthesia Quick Evaluation

## 2017-11-05 NOTE — Progress Notes (Addendum)
Patient ID: Jill Evans, female   DOB: [birth year], 27 y.o.   MRN: [id number] RN states cervical exam has changed   Same examiner Has done many position changes and peanut positioning  Cervix is higher and thicker  Dilation: 2 Effacement (%): 50 Cervical Position: Middle Station: -3, -2 Presentation: Vertex Exam by:: Milus Glazier RN  I suspect fetus has moved/shifted Will continue plan of care.

## 2017-11-05 NOTE — Progress Notes (Signed)
Patient and mother requesting epidural placement prior to initiating Pitocin. Dr. Earlene Plater and Anesthesia MD discussed drawbacks of getting epidural early into induction process. Pt refuses Pitocin before getting epidural.

## 2017-11-05 NOTE — Progress Notes (Signed)
LABOR PROGRESS NOTE  Jill Evans is a 27 y.o. G1P0 at [redacted]w[redacted]d  admitted for IOL for preE with severe features   Subjective: Patient states she is feeling much better. Having some nausea now. S/p epidural placement.   Objective: BP 138/85   Pulse 80   Temp 98.5 F (36.9 C) (Oral)   Resp 18   Ht 4\' 11"  (1.499 m)   Wt 77.1 kg   SpO2 96%   BMI 34.34 kg/m  or  Vitals:   11/05/17 0110 11/05/17 0115 11/05/17 0120 11/05/17 0125  BP: (!) 151/90 135/86 138/85 138/85  Pulse: 84 74 76 80  Resp: 18 18 18 18   Temp:      TempSrc:      SpO2:      Weight:      Height:        Dilation: Closed Effacement (%): Thick Cervical Position: Posterior Station: -3 Presentation: Vertex Exam by:: Dr.Beard FHT: baseline rate 120, moderate varibility, +acel, -decel Toco: irregular   Labs: Lab Results  Component Value Date   WBC 13.9 (H) 11/04/2017   HGB 12.2 11/04/2017   HCT 36.2 11/04/2017   MCV 91.4 11/04/2017   PLT 272 11/04/2017    Patient Active Problem List   Diagnosis Date Noted  . 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 GBS (group B Streptococcus carrier), +RV culture, currently pregnant 10/27/2017  . Malpresentation before onset of labor 10/26/2017  . Rh negative state in antepartum period 10/26/2017  . Supervision of high risk pregnancy due to social problems 10/26/2017  . Obesity in pregnancy 10/26/2017  . BMI 30s 10/26/2017  . Hypothyroidism   . Pre-eclampsia, severe, antepartum 10/24/2017  . Supervision of high risk pregnancy, antepartum 10/10/2017  . Late prenatal care 10/10/2017  . Vitamin D deficiency 06/30/2015  . ADD (attention deficit disorder) 05/23/2014    Assessment / Plan: 27 y.o. G1P0 at [redacted]w[redacted]d here for IOL for preE with severe features.   Labor: s/p cytotec x13, FB in, unable to start pitocin as patient refused until epidural was placed. Will plan to start low dose pitocin - stop at 6 miliunits/min Fetal Wellbeing:  Cat 1  I/D: GBS positive -  PCN Pain Control:  Epidural  Anticipated MOD:  NSVD Pre-E: last BP 138/85, labetalol PRN, continue Mg  Oralia Manis, DO PGY-2 11/05/2017, 1:43 AM

## 2017-11-05 NOTE — Progress Notes (Signed)
Patient ID: Jill Evans, female   DOB: Feb 13, 1990, 27 y.o.   MRN: [id number] Discussed with Dr Erin Fulling Will stop Pitocin and let her eat  Then will return to Cytotec for ripening for now

## 2017-11-05 NOTE — Progress Notes (Signed)
Charge RN at bedside with provider Lanice Shirts for cervical exam and foley bulb placement. Upon arrival to bedside, RN noticed food on the bedside table. Patient mentioned that she was glad to be able to eat. She says she had curly fries and 3/4 of a roast beef sandwich. Wynelle Bourgeois CNM put in regular diet at 1950 while the patient has had epidural infusing since 0056 10/6. Steward Drone CNM made aware, diet order changed, Dr Richardson Landry aware. Will continue to monitor. Irving Burton Hilery Wintle RN

## 2017-11-05 NOTE — Progress Notes (Signed)
Patient ID: Jill Evans, female   DOB: Dec 07, 1990, 27 y.o.   MRN: [id number] Doing well Vitals:   11/05/17 1530 11/05/17 1600 11/05/17 1630 11/05/17 1700  BP: 123/79 (!) 142/90 134/87 (!) 142/91  Pulse: 70 67 65 71  Resp:      Temp:      TempSrc:      SpO2:      Weight:      Height:       FHR reassuring UCs every 2-3 min  Dilation: 4 Effacement (%): 80 Cervical Position: Middle Station: -2 Presentation: Vertex Exam by:: Milus Glazier RN (last checked at 1400)  Continue plan of care

## 2017-11-05 NOTE — Anesthesia Procedure Notes (Signed)
Epidural Patient location during procedure: OB Start time: 11/05/2017 12:56 AM End time: 11/05/2017 1:06 AM  Staffing Anesthesiologist: Leonides Grills, MD Performed: anesthesiologist   Preanesthetic Checklist Completed: patient identified, site marked, pre-op evaluation, timeout performed, IV checked, risks and benefits discussed and monitors and equipment checked  Epidural Patient position: sitting Prep: DuraPrep Patient monitoring: heart rate, cardiac monitor, continuous pulse ox and blood pressure Approach: midline Location: L4-L5 Injection technique: LOR air  Needle:  Needle type: Tuohy  Needle gauge: 17 G Needle length: 9 cm Needle insertion depth: 7 cm Catheter type: closed end flexible Catheter size: 19 Gauge Catheter at skin depth: 11 cm Test dose: negative and Other  Assessment Events: blood not aspirated, injection not painful, no injection resistance and negative IV test  Additional Notes Informed consent obtained prior to proceeding including risk of failure, 1% risk of PDPH, risk of minor discomfort and bruising.  Discussed alternatives to epidural analgesia and patient desires to proceed.  Timeout performed pre-procedure verifying patient name, procedure, and platelet count.  Patient tolerated procedure well. Reason for block:procedure for pain

## 2017-11-05 NOTE — Progress Notes (Signed)
CSW spoke with Jill Dike, RN to obtain update on MOB. MOB is in active labor, most recent check revealed MOB is 3cm dilated. RN stated MOB's pitocin will be increased today.   CSW spoke with [place], [employer] SW to inform her of update.  CSW to continue following until after delivery.  Edwin Dada, MSW, LCSW-A Clinical Social Worker [facility] [phone]

## 2017-11-06 ENCOUNTER — Encounter (HOSPITAL_COMMUNITY)
Admission: AD | Disposition: A | Discharge status: Home / Self Care | Payer: Self-pay | Source: Home / Self Care | Attending: Family Medicine

## 2017-11-06 ENCOUNTER — Encounter (HOSPITAL_COMMUNITY): Payer: Self-pay | Admitting: *Deleted

## 2017-11-06 DIAGNOSIS — O1414 Severe pre-eclampsia complicating childbirth: Secondary | ICD-10-CM

## 2017-11-06 DIAGNOSIS — Z3A34 34 weeks gestation of pregnancy: Secondary | ICD-10-CM

## 2017-11-06 LAB — COMPREHENSIVE METABOLIC PANEL
ALBUMIN: 2.1 g/dL — AB (ref 3.5–5.0)
ALBUMIN: 2 g/dL — AB (ref 3.5–5.0)
ALK PHOS: 111 U/L (ref 38–126)
ALT: 16 U/L (ref 0–44)
ALT: 18 U/L (ref 0–44)
ANION GAP: 10 (ref 5–15)
AST: 23 U/L (ref 15–41)
AST: 20 U/L (ref 15–41)
Alkaline Phosphatase: 116 U/L (ref 38–126)
Anion gap: 10 (ref 5–15)
BILIRUBIN TOTAL: 0.6 mg/dL (ref 0.3–1.2)
BUN: 11 mg/dL (ref 6–20)
BUN: 10 mg/dL (ref 6–20)
CALCIUM: 6.7 mg/dL — AB (ref 8.9–10.3)
CHLORIDE: 98 mmol/L (ref 98–111)
CO2: 21 mmol/L — ABNORMAL LOW (ref 22–32)
CO2: 23 mmol/L (ref 22–32)
CREATININE: 0.53 mg/dL (ref 0.44–1.00)
Calcium: 6.9 mg/dL — ABNORMAL LOW (ref 8.9–10.3)
Chloride: 102 mmol/L (ref 98–111)
Creatinine, Ser: 0.55 mg/dL (ref 0.44–1.00)
GFR calc Af Amer: >60 mL/min (ref >60)
GFR calc non Af Amer: >60 mL/min (ref >60)
GLUCOSE: 78 mg/dL (ref 70–99)
GLUCOSE: 79 mg/dL (ref 70–99)
POTASSIUM: 4.7 mmol/L (ref 3.5–5.1)
Potassium: 3.9 mmol/L (ref 3.5–5.1)
SODIUM: 131 mmol/L — AB (ref 135–145)
Sodium: 133 mmol/L — ABNORMAL LOW (ref 135–145)
TOTAL PROTEIN: 6 g/dL — AB (ref 6.5–8.1)
Total Bilirubin: 0.8 mg/dL (ref 0.3–1.2)
Total Protein: 5.8 g/dL — ABNORMAL LOW (ref 6.5–8.1)

## 2017-11-06 LAB — CBC WITH DIFFERENTIAL/PLATELET
BASOS ABS: 0 10*3/uL (ref 0.0–0.1)
BASOS ABS: 0 10*3/uL (ref 0.0–0.1)
BASOS PCT: 0 %
BASOS PCT: 0 %
EOS ABS: 0 10*3/uL (ref 0.0–0.7)
Eosinophils Absolute: 0 10*3/uL (ref 0.0–0.7)
Eosinophils Relative: 0 %
Eosinophils Relative: 0 %
HCT: 33.1 % — ABNORMAL LOW (ref 36.0–46.0)
HEMATOCRIT: 35.4 % — AB (ref 36.0–46.0)
HEMOGLOBIN: 11.2 g/dL — AB (ref 12.0–15.0)
Hemoglobin: 11.9 g/dL — ABNORMAL LOW (ref 12.0–15.0)
LYMPHS PCT: 24 %
Lymphocytes Relative: 15 %
Lymphs Abs: 2.4 10*3/uL (ref 0.7–4.0)
Lymphs Abs: 2.3 10*3/uL (ref 0.7–4.0)
MCH: 30.2 pg (ref 26.0–34.0)
MCH: 30.7 pg (ref 26.0–34.0)
MCHC: 33.6 g/dL (ref 30.0–36.0)
MCHC: 33.8 g/dL (ref 30.0–36.0)
MCV: 89.8 fL (ref 78.0–100.0)
MCV: 90.7 fL (ref 78.0–100.0)
MONO ABS: 0.7 10*3/uL (ref 0.1–1.0)
Monocytes Absolute: 0.8 10*3/uL (ref 0.1–1.0)
Monocytes Relative: 7 %
Monocytes Relative: 6 %
NEUTROS PCT: 69 %
NEUTROS PCT: 79 %
Neutro Abs: 6.9 10*3/uL (ref 1.7–7.7)
Neutro Abs: 11.7 10*3/uL — ABNORMAL HIGH (ref 1.7–7.7)
PLATELETS: 229 10*3/uL (ref 150–400)
Platelets: 222 10*3/uL (ref 150–400)
RBC: 3.94 MIL/uL (ref 3.87–5.11)
RBC: 3.65 MIL/uL — ABNORMAL LOW (ref 3.87–5.11)
RDW: 12.1 % (ref 11.5–15.5)
RDW: 12 % (ref 11.5–15.5)
WBC: 10 10*3/uL (ref 4.0–10.5)
WBC: 14.8 10*3/uL — AB (ref 4.0–10.5)

## 2017-11-06 LAB — TYPE AND SCREEN
ABO/RH(D): B NEG
Antibody Screen: POSITIVE
UNIT DIVISION: 0
Unit division: 0

## 2017-11-06 LAB — BPAM RBC
BLOOD PRODUCT EXPIRATION DATE: 201911052359
Blood Product Expiration Date: 201911042359
UNIT TYPE AND RH: 9500
UNIT TYPE AND RH: 9500

## 2017-11-06 LAB — MAGNESIUM
MAGNESIUM: 5.9 mg/dL — AB (ref 1.7–2.4)
MAGNESIUM: 6.1 mg/dL — AB (ref 1.7–2.4)

## 2017-11-06 SURGERY — Surgical Case
Anesthesia: Epidural

## 2017-11-06 MED ORDER — ACETAMINOPHEN 500 MG PO TABS
1000.0000 mg | ORAL_TABLET | Freq: Four times a day (QID) | ORAL | Status: AC
  Administered 2017-11-07 (×3): 1000 mg via ORAL
  Filled 2017-11-06 (×3): qty 2

## 2017-11-06 MED ORDER — SODIUM CHLORIDE 0.9 % IR SOLN
Status: DC | PRN
  Administered 2017-11-06: 1000 mL

## 2017-11-06 MED ORDER — FENTANYL CITRATE (PF) 100 MCG/2ML IJ SOLN: 25.0000 ug | INTRAMUSCULAR | Status: DC | PRN

## 2017-11-06 MED ORDER — MEPERIDINE HCL 25 MG/ML IJ SOLN: 6.2500 mg | INTRAMUSCULAR | Status: DC | PRN

## 2017-11-06 MED ORDER — METOCLOPRAMIDE HCL 5 MG/ML IJ SOLN
INTRAMUSCULAR | Status: DC | PRN
  Administered 2017-11-06: 10 mg via INTRAVENOUS

## 2017-11-06 MED ORDER — ZOLPIDEM TARTRATE 5 MG PO TABS: 5.0000 mg | ORAL_TABLET | Freq: Every evening | ORAL | Status: DC | PRN

## 2017-11-06 MED ORDER — LABETALOL HCL 5 MG/ML IV SOLN: 40.0000 mg | INTRAVENOUS | Status: DC | PRN

## 2017-11-06 MED ORDER — ONDANSETRON HCL 4 MG/2ML IJ SOLN: 4.0000 mg | Freq: Three times a day (TID) | INTRAMUSCULAR | Status: DC | PRN

## 2017-11-06 MED ORDER — METOCLOPRAMIDE HCL 5 MG/ML IJ SOLN: 10.0000 mg | Freq: Once | INTRAMUSCULAR | Status: DC | PRN

## 2017-11-06 MED ORDER — IBUPROFEN 600 MG PO TABS
600.0000 mg | ORAL_TABLET | Freq: Four times a day (QID) | ORAL | Status: DC
  Administered 2017-11-07 – 2017-11-09 (×11): 600 mg via ORAL
  Filled 2017-11-06 (×11): qty 1

## 2017-11-06 MED ORDER — LABETALOL HCL 5 MG/ML IV SOLN: 80.0000 mg | INTRAVENOUS | Status: DC | PRN

## 2017-11-06 MED ORDER — MENTHOL 3 MG MT LOZG: 1.0000 | LOZENGE | OROMUCOSAL | Status: DC | PRN

## 2017-11-06 MED ORDER — SCOPOLAMINE 1 MG/3DAYS TD PT72
MEDICATED_PATCH | TRANSDERMAL | Status: DC | PRN
  Administered 2017-11-06: 1 via TRANSDERMAL

## 2017-11-06 MED ORDER — OXYTOCIN 10 UNIT/ML IJ SOLN
INTRAVENOUS | Status: DC | PRN
  Administered 2017-11-06: 40 [IU] via INTRAVENOUS

## 2017-11-06 MED ORDER — OXYTOCIN 40 UNITS IN LACTATED RINGERS INFUSION - SIMPLE MED: 2.5000 [IU]/h | INTRAVENOUS | Status: AC

## 2017-11-06 MED ORDER — SIMETHICONE 80 MG PO CHEW
80.0000 mg | CHEWABLE_TABLET | ORAL | Status: DC
  Administered 2017-11-07 – 2017-11-08 (×3): 80 mg via ORAL
  Filled 2017-11-06 (×3): qty 1

## 2017-11-06 MED ORDER — KETOROLAC TROMETHAMINE 30 MG/ML IJ SOLN: 30.0000 mg | Freq: Four times a day (QID) | INTRAMUSCULAR | Status: AC | PRN

## 2017-11-06 MED ORDER — DEXAMETHASONE SODIUM PHOSPHATE 4 MG/ML IJ SOLN
INTRAMUSCULAR | Status: DC | PRN
  Administered 2017-11-06: 4 mg via INTRAVENOUS

## 2017-11-06 MED ORDER — PROPOFOL 10 MG/ML IV BOLUS
INTRAVENOUS | Status: DC | PRN
  Administered 2017-11-06: 20 mg via INTRAVENOUS

## 2017-11-06 MED ORDER — TETANUS-DIPHTH-ACELL PERTUSSIS 5-2.5-18.5 LF-MCG/0.5 IM SUSP: 0.5000 mL | Freq: Once | INTRAMUSCULAR | Status: DC

## 2017-11-06 MED ORDER — OXYCODONE HCL 5 MG PO TABS: 5.0000 mg | ORAL_TABLET | ORAL | Status: DC | PRN

## 2017-11-06 MED ORDER — LACTATED RINGERS IV SOLN
INTRAVENOUS | Status: DC | PRN
  Administered 2017-11-06: 17:00:00 via INTRAVENOUS

## 2017-11-06 MED ORDER — BUPIVACAINE HCL (PF) 0.25 % IJ SOLN
INTRAMUSCULAR | Status: DC | PRN
  Administered 2017-11-06: 5 mL via EPIDURAL

## 2017-11-06 MED ORDER — OXYTOCIN 10 UNIT/ML IJ SOLN
INTRAMUSCULAR | Status: AC
  Filled 2017-11-06: qty 4

## 2017-11-06 MED ORDER — SODIUM BICARBONATE 8.4 % IV SOLN
INTRAVENOUS | Status: DC | PRN
  Administered 2017-11-06 (×3): 5 mL via EPIDURAL
  Administered 2017-11-06: 3 mL via EPIDURAL

## 2017-11-06 MED ORDER — MORPHINE SULFATE (PF) 0.5 MG/ML IJ SOLN
INTRAMUSCULAR | Status: AC
  Filled 2017-11-06: qty 10

## 2017-11-06 MED ORDER — SENNOSIDES-DOCUSATE SODIUM 8.6-50 MG PO TABS
2.0000 | ORAL_TABLET | ORAL | Status: DC
  Administered 2017-11-07 – 2017-11-08 (×3): 2 via ORAL
  Filled 2017-11-06 (×4): qty 2

## 2017-11-06 MED ORDER — DIBUCAINE 1 % RE OINT: 1.0000 "application " | TOPICAL_OINTMENT | RECTAL | Status: DC | PRN

## 2017-11-06 MED ORDER — MORPHINE SULFATE (PF) 0.5 MG/ML IJ SOLN
INTRAMUSCULAR | Status: DC | PRN
  Administered 2017-11-06 (×2): 1 mg via INTRAVENOUS
  Administered 2017-11-06: 2 mg via EPIDURAL
  Administered 2017-11-06: 1 mg via INTRAVENOUS

## 2017-11-06 MED ORDER — NALOXONE HCL 0.4 MG/ML IJ SOLN: 0.4000 mg | INTRAMUSCULAR | Status: DC | PRN

## 2017-11-06 MED ORDER — SCOPOLAMINE 1 MG/3DAYS TD PT72
MEDICATED_PATCH | TRANSDERMAL | Status: AC
  Filled 2017-11-06: qty 1

## 2017-11-06 MED ORDER — SALINE SPRAY 0.65 % NA SOLN
1.0000 | NASAL | Status: DC | PRN
  Filled 2017-11-06: qty 44

## 2017-11-06 MED ORDER — SODIUM CHLORIDE 0.9% FLUSH: 3.0000 mL | INTRAVENOUS | Status: DC | PRN

## 2017-11-06 MED ORDER — ONDANSETRON HCL 4 MG/2ML IJ SOLN
INTRAMUSCULAR | Status: DC | PRN
  Administered 2017-11-06: 4 mg via INTRAVENOUS

## 2017-11-06 MED ORDER — BUPIVACAINE HCL (PF) 0.5 % IJ SOLN
INTRAMUSCULAR | Status: DC | PRN
  Administered 2017-11-06: 30 mL

## 2017-11-06 MED ORDER — HYDRALAZINE HCL 20 MG/ML IJ SOLN: 10.0000 mg | INTRAMUSCULAR | Status: DC | PRN

## 2017-11-06 MED ORDER — DIPHENHYDRAMINE HCL 25 MG PO CAPS
25.0000 mg | ORAL_CAPSULE | Freq: Four times a day (QID) | ORAL | Status: DC | PRN
  Administered 2017-11-08: 25 mg via ORAL
  Filled 2017-11-06: qty 1

## 2017-11-06 MED ORDER — ENOXAPARIN SODIUM 40 MG/0.4ML ~~LOC~~ SOLN
40.0000 mg | SUBCUTANEOUS | Status: DC
  Administered 2017-11-07 – 2017-11-09 (×3): 40 mg via SUBCUTANEOUS
  Filled 2017-11-06 (×3): qty 0.4

## 2017-11-06 MED ORDER — OXYTOCIN 40 UNITS IN LACTATED RINGERS INFUSION - SIMPLE MED
1.0000 m[IU]/min | INTRAVENOUS | Status: DC
  Administered 2017-11-06: 2 m[IU]/min via INTRAVENOUS

## 2017-11-06 MED ORDER — SIMETHICONE 80 MG PO CHEW: 80.0000 mg | CHEWABLE_TABLET | ORAL | Status: DC | PRN

## 2017-11-06 MED ORDER — LABETALOL HCL 5 MG/ML IV SOLN: 20.0000 mg | INTRAVENOUS | Status: DC | PRN

## 2017-11-06 MED ORDER — LACTATED RINGERS IV SOLN: INTRAVENOUS | Status: DC

## 2017-11-06 MED ORDER — PHENYLEPHRINE 40 MCG/ML (10ML) SYRINGE FOR IV PUSH (FOR BLOOD PRESSURE SUPPORT)
PREFILLED_SYRINGE | INTRAVENOUS | Status: AC
  Filled 2017-11-06: qty 10

## 2017-11-06 MED ORDER — ACETAMINOPHEN 325 MG PO TABS: 650.0000 mg | ORAL_TABLET | ORAL | Status: DC | PRN

## 2017-11-06 MED ORDER — BUPIVACAINE HCL (PF) 0.5 % IJ SOLN
INTRAMUSCULAR | Status: AC
  Filled 2017-11-06: qty 30

## 2017-11-06 MED ORDER — OXYCODONE HCL 5 MG PO TABS: 10.0000 mg | ORAL_TABLET | ORAL | Status: DC | PRN

## 2017-11-06 MED ORDER — COCONUT OIL OIL: 1.0000 "application " | TOPICAL_OIL | Status: DC | PRN

## 2017-11-06 MED ORDER — SIMETHICONE 80 MG PO CHEW
80.0000 mg | CHEWABLE_TABLET | Freq: Three times a day (TID) | ORAL | Status: DC
  Administered 2017-11-07 – 2017-11-08 (×6): 80 mg via ORAL
  Filled 2017-11-06 (×7): qty 1

## 2017-11-06 MED ORDER — PRENATAL MULTIVITAMIN CH
1.0000 | ORAL_TABLET | Freq: Every day | ORAL | Status: DC
  Administered 2017-11-07 – 2017-11-09 (×3): 1 via ORAL
  Filled 2017-11-06 (×3): qty 1

## 2017-11-06 MED ORDER — CEFAZOLIN SODIUM-DEXTROSE 2-4 GM/100ML-% IV SOLN
2.0000 g | INTRAVENOUS | Status: AC
  Administered 2017-11-06: 2 g via INTRAVENOUS

## 2017-11-06 MED ORDER — WITCH HAZEL-GLYCERIN EX PADS: 1.0000 "application " | MEDICATED_PAD | CUTANEOUS | Status: DC | PRN

## 2017-11-06 SURGICAL SUPPLY — 43 items
BENZOIN TINCTURE PRP APPL 2/3 (GAUZE/BANDAGES/DRESSINGS) IMPLANT
CHLORAPREP W/TINT 26ML (MISCELLANEOUS) ×2 IMPLANT
CLAMP CORD UMBIL (MISCELLANEOUS) IMPLANT
CLOTH BEACON ORANGE TIMEOUT ST (SAFETY) ×2 IMPLANT
DRESSING PREVENA PLUS CUSTOM (GAUZE/BANDAGES/DRESSINGS) ×1 IMPLANT
DRSG OPSITE POSTOP 4X10 (GAUZE/BANDAGES/DRESSINGS) ×2 IMPLANT
DRSG PREVENA PLUS CUSTOM (GAUZE/BANDAGES/DRESSINGS) ×2
ELECT REM PT RETURN 9FT ADLT (ELECTROSURGICAL) ×2
ELECTRODE REM PT RTRN 9FT ADLT (ELECTROSURGICAL) ×1 IMPLANT
EXCISOR BIOPSY CONE FISHER (MISCELLANEOUS) ×2 IMPLANT
EXTRACTOR VACUUM BELL STYLE (SUCTIONS) IMPLANT
GLOVE BIOGEL PI IND STRL 6.5 (GLOVE) ×1 IMPLANT
GLOVE BIOGEL PI IND STRL 7.0 (GLOVE) ×2 IMPLANT
GLOVE BIOGEL PI INDICATOR 6.5 (GLOVE) ×1
GLOVE BIOGEL PI INDICATOR 7.0 (GLOVE) ×2
GLOVE ORTHOPEDIC STR SZ6.5 (GLOVE) ×2 IMPLANT
GOWN STRL REUS W/TWL LRG LVL3 (GOWN DISPOSABLE) ×6 IMPLANT
HEMOSTAT SURGICEL 2X3 (HEMOSTASIS) ×6 IMPLANT
KIT ABG SYR 3ML LUER SLIP (SYRINGE) IMPLANT
KIT PREVENA INCISION MGT20CM45 (CANNISTER) ×2 IMPLANT
NEEDLE HYPO 22GX1.5 SAFETY (NEEDLE) ×2 IMPLANT
NEEDLE HYPO 25X1 1.5 SAFETY (NEEDLE) IMPLANT
NS IRRIG 1000ML POUR BTL (IV SOLUTION) ×2 IMPLANT
PACK C SECTION WH (CUSTOM PROCEDURE TRAY) ×2 IMPLANT
PAD OB MATERNITY 4.3X12.25 (PERSONAL CARE ITEMS) ×2 IMPLANT
PENCIL SMOKE EVAC W/HOLSTER (ELECTROSURGICAL) ×2 IMPLANT
RETRACTOR WND ALEXIS 25 LRG (MISCELLANEOUS) ×1 IMPLANT
RTRCTR WOUND ALEXIS 25CM LRG (MISCELLANEOUS) ×2
SPONGE LAP 18X18 RF (DISPOSABLE) ×6 IMPLANT
SPONGE LAP 18X18 X RAY DECT (DISPOSABLE) ×2 IMPLANT
STRIP CLOSURE SKIN 1/2X4 (GAUZE/BANDAGES/DRESSINGS) IMPLANT
SUT MON AB 4-0 PS1 27 (SUTURE) ×2 IMPLANT
SUT PDS AB 0 CTX 60 (SUTURE) ×2 IMPLANT
SUT PLAIN 2 0 (SUTURE) ×1
SUT PLAIN ABS 2-0 CT1 27XMFL (SUTURE) ×1 IMPLANT
SUT VIC AB 0 CT1 36 (SUTURE) ×4 IMPLANT
SUT VIC AB 0 CTX 36 (SUTURE) ×3
SUT VIC AB 0 CTX36XBRD ANBCTRL (SUTURE) ×3 IMPLANT
SUT VIC AB 2-0 CT1 27 (SUTURE) ×1
SUT VIC AB 2-0 CT1 TAPERPNT 27 (SUTURE) ×1 IMPLANT
SYR CONTROL 10ML LL (SYRINGE) ×2 IMPLANT
TOWEL OR 17X24 6PK STRL BLUE (TOWEL DISPOSABLE) ×2 IMPLANT
TRAY FOLEY W/BAG SLVR 14FR LF (SET/KITS/TRAYS/PACK) ×2 IMPLANT

## 2017-11-06 NOTE — Transfer of Care (Addendum)
Immediate Anesthesia Transfer of Care Note  Patient: Jill Evans  Procedure(s) Performed: CESAREAN SECTION (N/A )  Patient Location: PACU  Anesthesia Type:Epidural  Level of Consciousness: awake, alert  and oriented  Airway & Oxygen Therapy: Patient Spontanous Breathing  Post-op Assessment: Report given to RN and Post -op Vital signs reviewed and stable  Post vital signs: Reviewed and stable   HR 72 Sats 93% on RA BP 126/87 resp 18  Last Vitals:  Vitals Value Taken Time  BP    Temp    Pulse 72 11/06/2017  5:37 PM  Resp    SpO2 91 % 11/06/2017  5:37 PM  Vitals shown include unvalidated device data.  Last Pain:  Vitals:   11/06/17 1531  TempSrc:   PainSc: 0-No pain      Patients Stated Pain Goal: 0 (11/03/17 2131)  Complications: No apparent anesthesia complications

## 2017-11-06 NOTE — Progress Notes (Signed)
Spent time with K[place], FOB, and pt's mother.  She shared that she's exhausted and overwhelmed, but grateful to not be in pain and happy with her new adoption plan.  She's eager tos pend time with adoptive parents today to feel like she knows them better since she was groggy during their first meeting.   Please page as further needs arise.  Maryanna Shape. Carley Hammed, M.Div. [facility] Chaplain Pager [phone] Office [phone]

## 2017-11-06 NOTE — Progress Notes (Signed)
Pt refusing BP. Agitated and crying

## 2017-11-06 NOTE — Anesthesia Postprocedure Evaluation (Signed)
Anesthesia Post Note  Patient: Lynett Fish  Procedure(s) Performed: CESAREAN SECTION (N/A )     Patient location during evaluation: Mother Baby Anesthesia Type: Epidural Level of consciousness: oriented and awake and alert Pain management: pain level controlled Vital Signs Assessment: post-procedure vital signs reviewed and stable Respiratory status: spontaneous breathing, respiratory function stable and patient connected to nasal cannula oxygen Cardiovascular status: blood pressure returned to baseline and stable Postop Assessment: no headache, no backache and no apparent nausea or vomiting Anesthetic complications: no    Last Vitals:  Vitals:   11/06/17 1815 11/06/17 1830  BP: 130/87 (!) 129/91  Pulse: 69 77  Resp: 18 20  Temp:  36.6 C  SpO2: 92% 92%    Last Pain:  Vitals:   11/06/17 1830  TempSrc: Axillary  PainSc: 0-No pain   Pain Goal: Patients Stated Pain Goal: 0 (11/03/17 2131)               Trevor Iha

## 2017-11-06 NOTE — Op Note (Signed)
Shadia A Robart PROCEDURE DATE: 11/06/2017  PREOPERATIVE DIAGNOSES: Intrauterine pregnancy at [redacted]w[redacted]d weeks gestation; non-reassuring fetal tracing, worsening pre-eclampsia with severe features  POSTOPERATIVE DIAGNOSES: The same  PROCEDURE: Primary Low Transverse Cesarean Section  SURGEON:  Baldemar Lenis, MD  ASSISTANT:  none  ANESTHESIOLOGY TEAM: Anesthesiologist: Phillips Grout, MD; Leonides Grills, MD; Trevor Iha, MD  INDICATIONS: Jill Evans is a 27 y.o. G1P0101 at [redacted]w[redacted]d here for cesarean section secondary to the indications listed under preoperative diagnoses; please see preoperative note for further details.  The risks of cesarean section were discussed with the patient including but were not limited to: bleeding which may require transfusion or reoperation; infection which may require antibiotics; injury to bowel, bladder, ureters or other surrounding organs; injury to the fetus; need for additional procedures including hysterectomy in the event of a life-threatening hemorrhage; placental abnormalities wth subsequent pregnancies, incisional problems, thromboembolic phenomenon and other postoperative/anesthesia complications.  She consents to blood transfusion in the event of an emergency. The patient verbalized understanding of the plan, giving informed written consent for the procedure.    FINDINGS:  Viable female infant in cephalic presentation.  Apgars 5 and 8.  Clear amniotic fluid.  Intact placenta, three vessel cord.  Normal uterus, fallopian tubes and ovaries bilaterally. Cord pH venous: 7.35  ANESTHESIA: Epidural  INTRAVENOUS FLUIDS: 300 ml   ESTIMATED BLOOD LOSS: 500 ml URINE OUTPUT:  200 ml SPECIMENS: Placenta sent to L&D COMPLICATIONS: None immediate  PROCEDURE IN DETAIL:  The patient preoperatively received intravenous antibiotics and had sequential compression devices applied to her lower extremities.  She was then taken to the operating room where the  epidural anesthesia was dosed up to surgical level and was found to be adequate. She was then placed in a dorsal supine position with a leftward tilt, and prepped and draped in a sterile manner.  A foley catheter was already in place in her bladde and attached to constant gravity.  After a timeout was performed, a Pfannenstiel skin incision was made with scalpel and carried through to the underlying layer of fascia. The fascia was incised in the midline, and this incision was extended bilaterally using the Mayo scissors.  Kocher clamps were applied to the superior aspect of the fascial incision and the underlying rectus muscles were dissected off bluntly.  A similar process was carried out on the inferior aspect of the fascial incision. The rectus muscles were separated in the midline bluntly and the peritoneum was entered bluntly. The peritoneal incision was carefully extended bluntly laterally and caudad with good visualization of the bladder. The uterus appeared normal. The bladder blade was inserted. Attention was turned to the lower uterine segment where a low transverse hysterotomy was made with a scalpel and extended bilaterally bluntly.  The infant was delivered from LOP position, nose and mouth were bulb suctioned, and the cord clamped and cut shortly after delivery for poor tone and cry. The infant was then handed over to the waiting neonatology team. Uterine massage was then performed, and the placenta delivered intact with a three-vessel cord. The uterus was then cleared of clots and debris.  The hysterotomy was closed with 0 Vicryl in a running locked fashion, and an imbricating layer was also placed with 0 Vicryl. Figure-of-eight 0 Vicryl serosal stitches were placed to help with hemostasis. Surgicel placed over hysterotomy for minor oozing. The fallopian tubes and ovaries were visualized bilaterally and normal appearing.    The pelvis was cleared of all clot and  debris. Hemostasis was again  confirmed on all surfaces. The peritoneum was re-approximated using 2-0 Vicryl suture. The fascia was then closed using 0 PDS in a running fashion.  The subcutaneous layer was irrigated, then reapproximated with 2-0 plain gut.  The skin was closed with a 4-0 Monocryl subcuticular stitch and a Pravena wound vac was placed around the incision. The patient tolerated the procedure well. Sponge, lap, instrument and needle counts were correct x 3.  She was taken to the recovery room in stable condition.    Baldemar Lenis, M.D. Attending Obstetrician & Gynecologist, [facility] for [employer], [facility]

## 2017-11-06 NOTE — Progress Notes (Signed)
Patient ID: Jill Evans, female   DOB: 11-25-1990, 27 y.o.   MRN: [id number] Continues to refuse position changes Epidural topped up by anesthetist  Vitals:   11/06/17 1440 11/06/17 1445 11/06/17 1500 11/06/17 1513  BP: (!) 134/91 (!) 149/95  (!) 162/112  Pulse: 74 77  83  Resp:   20   Temp:      TempSrc:      SpO2:      Weight:      Height:       Labetalol given per protocol  FHR flat Dilation: 4 Effacement (%): 80 Cervical Position: Middle Station: -1 Presentation: Vertex Exam by:: sowder  Dr Earlene Plater reviewed tracing since last night, vitals and progress  She will consult MFM re: plan

## 2017-11-06 NOTE — Progress Notes (Signed)
Faculty Note  In to speak with patient, she is ready to proceed with c-section. Reviewed recommendation to proceed based on decreasing variability, increasing need for IV anti-hypertensives requiring multiple IV doses of labetalol. Patient and family are agreeable.  The risks of cesarean section were discussed with the patient; including but not limited to: infection which may require antibiotics; bleeding which may require transfusion or re-operation; injury to bowel, bladder, ureters or other surrounding organs; injury to the fetus; need for additional procedures; placental abnormalities wth subsequent pregnancies, risk of needing c-sections in future pregnancies, incisional problems, thromboembolic phenomenon and other postoperative/anesthesia complications. Answered all questions. The patient verbalized understanding of the plan, giving informed consent for the procedure. She is agreeable to blood transfusion in the event of emergency.  Patient has been NPO, she will remain NPO for procedure Anesthesia and OR aware Preoperative prophylactic antibiotics and SCDs ordered on call to the OR  To OR when ready   K. Therese Sarah, M.D. [facility] [employer]

## 2017-11-06 NOTE — Progress Notes (Signed)
Jill Evans is a 27 y.o. G1P0 at 105w5d by ultrasound admitted for induction of labor due to Pre-eclamptic toxemia of pregnancy..  Subjective:  Pt is on IOL day #4. She has received multiple doses of cytotec. She is now having regular contractions by palpation that are not seen well on toco.   Objective: BP (!) 106/58   Pulse 67   Temp 97.9 F (36.6 C) (Axillary)   Resp 18   Ht 4\' 11"  (1.499 m)   Wt 77.1 kg   SpO2 96%   BMI 34.34 kg/m  I/O last 3 completed shifts: In: 4194.2 [P.O.:1360; I.V.:2734.2; IV Piggyback:100] Out: 6775 [Urine:6275; Emesis/NG output:500] Total I/O In: 1765.1 [P.O.:720; I.V.:1045.1] Out: 925 [Urine:925]  FHT:  FHR: 120's bpm, variability: moderate,  accelerations:  Present,  decelerations:  Absent UC:   Not seen well on toco  SVE:   Dilation: 3.5 Effacement (%): 50 Station: -2 Exam by:: Lanice Shirts CNM AROM clear. IUPC placed.   Labs: Lab Results  Component Value Date   WBC 13.9 (H) 11/04/2017   HGB 12.2 11/04/2017   HCT 36.2 11/04/2017   MCV 91.4 11/04/2017   PLT 272 11/04/2017    Assessment / Plan: Induction of labor due to preeclampsia,  progressing well on pitocin  Labor: prolonged IOL. P is ripe at present. Had a bulging BOW that was just rupured.  Preeclampsia:  on magnesium sulfate Fetal Wellbeing:  Category I Pain Control:  Epidural I/D:  n/a Anticipated MOD:  planning for a SVD.    Jill Evans 11/06/2017, 3:22 AM

## 2017-11-06 NOTE — Progress Notes (Addendum)
Patient ID: Jill Evans, female   DOB: [birth year], 27 y.o.   MRN: [id number] Reluctant to turn in bed Discussed disadvantages to staying in one position  Vitals:   11/06/17 0831 11/06/17 0904 11/06/17 0931 11/06/17 0959  BP: 120/89 (!) 140/102 (!) 153/97   Pulse: 75 74 68   Resp: (!) 22 20 20    Temp: 97.6 F (36.4 C)   97.9 F (36.6 C)  TempSrc: Oral   Oral  SpO2:      Weight:      Height:       FHR stable  Dilation: 3.5 Effacement (%): 80 Cervical Position: Posterior Station: -2 Presentation: Vertex Exam by:: Mayford Knife, CNM  Position changed.     In to meet patient, she is feeling well. Tired of being in labor/bed but generally okay. Reviewed course and that she is not in labor. Reviewed reasons would recommend proceeding with c-section. Patient would like to avoid c-section however, she is agreeable if recommended. FHT has been moderate variability with some accels, no decels, however variability decreased from earlier in course. Repeat lab work done this am. Will continue to closely monitor.  Baldemar Lenis, M.D. [facility]

## 2017-11-07 ENCOUNTER — Encounter (HOSPITAL_COMMUNITY): Payer: Self-pay

## 2017-11-07 LAB — CREATININE, SERUM
Creatinine, Ser: 0.59 mg/dL (ref 0.44–1.00)
GFR calc Af Amer: >60 mL/min (ref >60)
GFR calc non Af Amer: >60 mL/min (ref >60)

## 2017-11-07 LAB — CBC
HCT: 28.6 % — ABNORMAL LOW (ref 36.0–46.0)
HEMOGLOBIN: 9.6 g/dL — AB (ref 12.0–15.0)
MCH: 30.6 pg (ref 26.0–34.0)
MCHC: 33.6 g/dL (ref 30.0–36.0)
MCV: 91.1 fL (ref 80.0–100.0)
Platelets: 234 10*3/uL (ref 150–400)
RBC: 3.14 MIL/uL — AB (ref 3.87–5.11)
RDW: 12 % (ref 11.5–15.5)
WBC: 13.5 10*3/uL — ABNORMAL HIGH (ref 4.0–10.5)

## 2017-11-07 MED ORDER — POTASSIUM CHLORIDE CRYS ER 20 MEQ PO TBCR
40.0000 meq | EXTENDED_RELEASE_TABLET | Freq: Every day | ORAL | Status: DC
  Administered 2017-11-07 – 2017-11-09 (×3): 40 meq via ORAL
  Filled 2017-11-07 (×4): qty 2

## 2017-11-07 MED ORDER — RHO D IMMUNE GLOBULIN 1500 UNIT/2ML IJ SOSY
300.0000 ug | PREFILLED_SYRINGE | Freq: Once | INTRAMUSCULAR | Status: AC
  Administered 2017-11-07: 300 ug via INTRAVENOUS
  Filled 2017-11-07: qty 2

## 2017-11-07 MED ORDER — FUROSEMIDE 10 MG/ML IJ SOLN
40.0000 mg | Freq: Every day | INTRAMUSCULAR | Status: DC
  Administered 2017-11-07 – 2017-11-08 (×2): 40 mg via INTRAVENOUS
  Filled 2017-11-07 (×3): qty 4

## 2017-11-07 MED ORDER — LACTATED RINGERS IV SOLN
INTRAVENOUS | Status: DC
  Administered 2017-11-07: 04:00:00 via INTRAVENOUS

## 2017-11-07 NOTE — Progress Notes (Signed)
CSW acknowledges consult and completed clinical assessment.  Clinical documentation will follow.  Adoption agency representative, Jamesetta So, will complete all required adoption documents onThursday (11/09/17).  Blaine Hamper, MSW, LCSW Clinical Social Work [phone]

## 2017-11-07 NOTE — Plan of Care (Signed)
  Problem: Pain Managment: Goal: General experience of comfort will improve Outcome: Progressing   Problem: Fluid Volume: Goal: Peripheral tissue perfusion will improve Outcome: Progressing   Problem: Education: Goal: Knowledge of condition will improve Outcome: Progressing

## 2017-11-07 NOTE — Progress Notes (Signed)
Faculty Attending Note  Post Op Day 1  Subjective: Patient is feeling very well. She reports well controlled pain on PO pain meds. She is ambulating and denies light-headedness or dizziness. She has foley in until 5 pm. She is  passing flatus, has not had a BM yet. She is tolerating a regular diet without nausea/vomiting. Bleeding is moderate. No breast/bottle feeding, baby up for adoption and she is very happy to get to know adoptive family, they have been very involved.  Objective: Blood pressure 113/81, pulse (!) 58, temperature 98.2 F (36.8 C), resp. rate 16, height 4\' 11"  (1.499 m), weight 77.1 kg, SpO2 97 %, unknown if currently breastfeeding. Temp:  [97.3 F (36.3 C)-98.4 F (36.9 C)] 98.2 F (36.8 C) (10/08 0403) Pulse Rate:  [56-108] 58 (10/08 0403) Resp:  [16-23] 16 (10/08 0710) BP: (113-174)/(75-112) 113/81 (10/08 0403) SpO2:  [91 %-99 %] 97 % (10/08 0700)  Physical Exam:  General: alert, oriented, cooperative Chest: CTAB, normal respiratory effort Heart: RRR  Abdomen: +BS, soft, appropriately tender to palpation, incision covered by dressing with no evidence of active bleeding  Uterine Fundus: difficult to palpate 2/2 habitus  Lochia: moderate, rubra DVT Evaluation: no evidence of DVT Extremities: +3 edema, no calf tenderness  UOP: 12-50 mL/hr dark yellow urine overnight   Current Facility-Administered Medications:  .  acetaminophen (TYLENOL) tablet 1,000 mg, 1,000 mg, Oral, Q6H, Phillips Grout, MD, 1,000 mg at 11/07/17 0643 .  acetaminophen (TYLENOL) tablet 650 mg, 650 mg, Oral, Q4H PRN, Conan Bowens, MD .  coconut oil, 1 application, Topical, PRN, Conan Bowens, MD .  witch hazel-glycerin (TUCKS) pad 1 application, 1 application, Topical, PRN **AND** dibucaine (NUPERCAINAL) 1 % rectal ointment 1 application, 1 application, Rectal, PRN, Conan Bowens, MD .  diphenhydrAMINE (BENADRYL) capsule 25 mg, 25 mg, Oral, Q6H PRN, Conan Bowens, MD .  enoxaparin  (LOVENOX) injection 40 mg, 40 mg, Subcutaneous, Q24H, Conan Bowens, MD .  labetalol (NORMODYNE,TRANDATE) injection 20 mg, 20 mg, Intravenous, PRN **AND** labetalol (NORMODYNE,TRANDATE) injection 40 mg, 40 mg, Intravenous, PRN **AND** labetalol (NORMODYNE,TRANDATE) injection 80 mg, 80 mg, Intravenous, PRN **AND** hydrALAZINE (APRESOLINE) injection 10 mg, 10 mg, Intravenous, PRN **AND** Measure blood pressure, , , Once, Conan Bowens, MD .  ibuprofen (ADVIL,MOTRIN) tablet 600 mg, 600 mg, Oral, Q6H, Conan Bowens, MD, 600 mg at 11/07/17 1610 .  ketorolac (TORADOL) 30 MG/ML injection 30 mg, 30 mg, Intravenous, Q6H PRN **OR** ketorolac (TORADOL) 30 MG/ML injection 30 mg, 30 mg, Intramuscular, Q6H PRN, Phillips Grout, MD .  lactated ringers infusion, , Intravenous, Continuous, Conan Bowens, MD, Last Rate: 75 mL/hr at 11/07/17 0700 .  levothyroxine (SYNTHROID, LEVOTHROID) tablet 100 mcg, 100 mcg, Oral, QAC breakfast, Conan Bowens, MD, 100 mcg at 11/06/17 0756 .  magnesium sulfate 40 grams in LR 500 mL OB infusion, 2 g/hr, Intravenous, Titrated, Conan Bowens, MD, Last Rate: 25 mL/hr at 11/07/17 0700, 2 g/hr at 11/07/17 0700 .  menthol-cetylpyridinium (CEPACOL) lozenge 3 mg, 1 lozenge, Oral, Q2H PRN, Conan Bowens, MD .  naloxone [facility]) injection 0.4 mg, 0.4 mg, Intravenous, PRN **AND** sodium chloride flush (NS) 0.9 % injection 3 mL, 3 mL, Intravenous, PRN, Phillips Grout, MD .  ondansetron (ZOFRAN) injection 4 mg, 4 mg, Intravenous, Q8H PRN, Phillips Grout, MD .  oxyCODONE (Oxy IR/ROXICODONE) immediate release tablet 10 mg, 10 mg, Oral, Q4H PRN, Conan Bowens, MD .  oxyCODONE (Oxy IR/ROXICODONE) immediate release tablet 5 mg, 5  mg, Oral, Q4H PRN, Conan Bowens, MD .  oxytocin (PITOCIN) IV infusion 40 units in LR 1000 mL - Premix, 2.5 Units/hr, Intravenous, Continuous, Conan Bowens, MD, Stopped at 11/07/17 0408 .  prenatal multivitamin tablet 1 tablet, 1 tablet, Oral, Q1200, Conan Bowens,  MD .  senna-docusate (Senokot-S) tablet 2 tablet, 2 tablet, Oral, Q24H, Conan Bowens, MD, 2 tablet at 11/07/17 0028 .  simethicone (MYLICON) chewable tablet 80 mg, 80 mg, Oral, TID PC, Conan Bowens, MD .  simethicone [facility]) chewable tablet 80 mg, 80 mg, Oral, Q24H, Conan Bowens, MD, 80 mg at 11/07/17 0028 .  simethicone (MYLICON) chewable tablet 80 mg, 80 mg, Oral, PRN, Conan Bowens, MD .  Tdap [facility]) injection 0.5 mL, 0.5 mL, Intramuscular, Once, Conan Bowens, MD .  zolpidem [facility]) tablet 5 mg, 5 mg, Oral, QHS PRN, Conan Bowens, MD Recent Labs    11/06/17 1507 11/07/17 0550  HGB 11.2* 9.6*  HCT 33.1* 28.6*    Assessment/Plan:  Patient is 27 y.o. G1P0101 POD#1 s/p 1LTCS at [redacted]w[redacted]d for non-reassuring fetal status during induction of labor for pre-eclampsia with severe features. She is doing very well, recovering appropriately and complains only of soreness.   Cont MgSO4 until 5 pm May need to restart PO anti-hypertensives Continue routine post partum care Pain meds prn Regular diet undecided for birth control Plan for discharge possibly tomorrow SW consult, baby up for adoption   Baldemar Lenis, M.D. [facility]  11/07/2017, 7:45 AM

## 2017-11-07 NOTE — Lactation Note (Signed)
This note was copied from a baby's chart. Lactation Consultation Note  Patient Name: Jill Evans [id number] Date: 11/07/2017  [facility] Follow Up Visit:  Mother is putting baby up for adoption.  According to RN she is familiar with breast milk suppression and is wearing a bra.  She does not want to pump.  There is no need for my visit at this time.  RN may call me for any questions/concerns.   Izaia Say R Jaycelynn Knickerbocker 11/07/2017, 12:59 PM

## 2017-11-07 NOTE — Addendum Note (Signed)
Addendum  created 11/07/17 0836 by Mohd. Derflinger O, CRNA   Sign clinical note    

## 2017-11-07 NOTE — Anesthesia Postprocedure Evaluation (Signed)
Anesthesia Post Note  Patient: Jill Evans  Procedure(s) Performed: CESAREAN SECTION (N/A )     Patient location during evaluation: Mother Baby Anesthesia Type: Epidural Level of consciousness: awake and alert and oriented Pain management: satisfactory to patient Vital Signs Assessment: post-procedure vital signs reviewed and stable Respiratory status: respiratory function stable Cardiovascular status: stable Postop Assessment: no headache, no backache, epidural receding, patient able to bend at knees, no signs of nausea or vomiting and adequate PO intake Anesthetic complications: no    Last Vitals:  Vitals:   11/07/17 0710 11/07/17 0750  BP:  140/81  Pulse:  (!) 58  Resp: 16 18  Temp:  36.6 C  SpO2:  98%    Last Pain:  Vitals:   11/07/17 0750  TempSrc: Oral  PainSc:    Pain Goal: Patients Stated Pain Goal: 4 (11/06/17 1958)               Karleen Dolphin

## 2017-11-08 ENCOUNTER — Encounter (HOSPITAL_COMMUNITY): Payer: Self-pay | Admitting: Obstetrics and Gynecology

## 2017-11-08 LAB — RH IG WORKUP (INCLUDES ABO/RH)
ABO/RH(D): B NEG
FETAL SCREEN: NEGATIVE
Gestational Age(Wks): 34.5
UNIT DIVISION: 0

## 2017-11-08 MED ORDER — DOCUSATE SODIUM 100 MG PO CAPS: 100.0000 mg | ORAL_CAPSULE | Freq: Two times a day (BID) | ORAL | Status: DC | PRN

## 2017-11-08 MED ORDER — FERROUS SULFATE 325 (65 FE) MG PO TABS
325.0000 mg | ORAL_TABLET | Freq: Three times a day (TID) | ORAL | Status: DC
  Administered 2017-11-08 – 2017-11-09 (×4): 325 mg via ORAL
  Filled 2017-11-08 (×4): qty 1

## 2017-11-08 MED ORDER — ENALAPRIL MALEATE 5 MG PO TABS
5.0000 mg | ORAL_TABLET | Freq: Every day | ORAL | Status: DC
  Administered 2017-11-08: 5 mg via ORAL
  Filled 2017-11-08 (×2): qty 1

## 2017-11-08 NOTE — Progress Notes (Signed)
Faculty Practice OB/GYN Attending Note  Subjective:  Called to evaluate patient with new onset right sided parotid gland fullness/swelling.  No tenderness, just noticed it. No fevers, malaise, myalgias.  She just feel like something is in the side of her mouth. No reported trauma to that side of her mouth. No contacts with anyone who has mumps. Feels it could be allergic reaction, has just received Benadryl.  Admitted on 10/28/2017 for Pre-eclampsia, severe, antepartum. Of note, recently started Enalapril for BP control this afternoon.    Objective:  Blood pressure (!) 130/95, pulse 81, temperature 97.8 F (36.6 C), temperature source Oral, resp. rate 18, height 4\' 11"  (1.499 m), weight 77.1 kg, SpO2 98 %, unknown if currently breastfeeding.  Patient Vitals for the past 24 hrs:  BP Temp Temp src Pulse Resp SpO2  11/08/17 1542 (!) 130/95 97.8 F (36.6 C) Oral 81 18 98 %  11/08/17 1302 (!) 140/98 - - 73 - -  11/08/17 1234 (!) 156/84 97.8 F (36.6 C) Oral 84 18 99 %  11/08/17 0750 130/80 98 F (36.7 C) Oral 68 18 98 %  11/08/17 0454 123/78 97.9 F (36.6 C) Oral 62 18 100 %  11/07/17 2356 131/80 97.7 F (36.5 C) Oral 61 17 100 %  11/07/17 1940 120/88 97.6 F (36.4 C) Oral 70 20 100 %  11/07/17 1622 123/69 98.1 F (36.7 C) Oral 70 20 100 %   Gen: NAD Face: +R parotid gland swelling, no erythema, no tenderness, Lungs: Normal respiratory effort Heart: Regular rate noted   Assessment & Plan:  Right parotid gland swelling, nontender, no fevers, no myalgias Low suspicion for mumps at his point, discussed with Dr. Merceda Elks (ID) who agreed with this assessment However, will check mumps IgM and IgG (only mumps related lab in Epic) Patient is rubella immune, but unsure of what this means for mumps immunity Will continue close observation.   Jaynie Collins, MD, FACOG Obstetrician & Gynecologist, [facility] for [employer], [facility]

## 2017-11-08 NOTE — Progress Notes (Signed)
Faculty Attending Note  Post Op Day2  Subjective: Patient is feeling very well. She reports well controlled pain on PO pain meds. She is ambulating and denies light-headedness or dizziness.  She is  passing flatus, has not had a BM yet. She is tolerating a regular diet without nausea/vomiting. Bleeding is moderate. No breast/bottle feeding, baby up for adoption. No other concerning symptoms.  Objective: Blood pressure 130/80, pulse 68, temperature 98 F (36.7 C), temperature source Oral, resp. rate 18, height 4\' 11"  (1.499 m), weight 77.1 kg, SpO2 98 %, unknown if currently breastfeeding. Temp:  [97.6 F (36.4 C)-98.1 F (36.7 C)] 98 F (36.7 C) (10/09 0750) Pulse Rate:  [61-70] 68 (10/09 0750) Resp:  [17-20] 18 (10/09 0750) BP: (120-131)/(69-88) 130/80 (10/09 0750) SpO2:  [98 %-100 %] 98 % (10/09 0750)  Patient Vitals for the past 24 hrs:  BP Temp Temp src Pulse Resp SpO2  11/08/17 0750 130/80 98 F (36.7 C) Oral 68 18 98 %  11/08/17 0454 123/78 97.9 F (36.6 C) Oral 62 18 100 %  11/07/17 2356 131/80 97.7 F (36.5 C) Oral 61 17 100 %  11/07/17 1940 120/88 97.6 F (36.4 C) Oral 70 20 100 %  11/07/17 1622 123/69 98.1 F (36.7 C) Oral 70 20 100 %  11/07/17 1510 - - - - 18 -  11/07/17 1403 - - - - 17 -  11/07/17 1307 - - - - 18 -  11/07/17 1207 - - - - 18 -    Physical Exam:  General: alert, oriented, cooperative Chest: normal respiratory effort Heart: normal hear rate Abdomen: +BS, soft, appropriately tender to palpation, incision covered by Prevena Uterine Fundus: difficult to palpate 2/2 habitus  Lochia: moderate, rubra DVT Evaluation: no evidence of DVT Extremities: +3 edema, no calf tenderness    Current Facility-Administered Medications:  .  acetaminophen (TYLENOL) tablet 650 mg, 650 mg, Oral, Q4H PRN, Conan Bowens, MD .  coconut oil, 1 application, Topical, PRN, Conan Bowens, MD .  witch hazel-glycerin (TUCKS) pad 1 application, 1 application, Topical, PRN  **AND** dibucaine (NUPERCAINAL) 1 % rectal ointment 1 application, 1 application, Rectal, PRN, Conan Bowens, MD .  diphenhydrAMINE (BENADRYL) capsule 25 mg, 25 mg, Oral, Q6H PRN, Conan Bowens, MD .  enoxaparin (LOVENOX) injection 40 mg, 40 mg, Subcutaneous, Q24H, Conan Bowens, MD, 40 mg at 11/08/17 0847 .  furosemide (LASIX) injection 40 mg, 40 mg, Intravenous, Daily, Camey Edell A, MD, 40 mg at 11/08/17 0937 .  labetalol (NORMODYNE,TRANDATE) injection 20 mg, 20 mg, Intravenous, PRN **AND** labetalol (NORMODYNE,TRANDATE) injection 40 mg, 40 mg, Intravenous, PRN **AND** labetalol (NORMODYNE,TRANDATE) injection 80 mg, 80 mg, Intravenous, PRN **AND** hydrALAZINE (APRESOLINE) injection 10 mg, 10 mg, Intravenous, PRN **AND** Measure blood pressure, , , Once, Conan Bowens, MD .  ibuprofen (ADVIL,MOTRIN) tablet 600 mg, 600 mg, Oral, Q6H, Conan Bowens, MD, 600 mg at 11/08/17 1114 .  lactated ringers infusion, , Intravenous, Continuous, Conan Bowens, MD, Stopped at 11/07/17 1635 .  levothyroxine (SYNTHROID, LEVOTHROID) tablet 100 mcg, 100 mcg, Oral, QAC breakfast, Conan Bowens, MD, 100 mcg at 11/08/17 0847 .  menthol-cetylpyridinium (CEPACOL) lozenge 3 mg, 1 lozenge, Oral, Q2H PRN, Conan Bowens, MD .  naloxone [facility]) injection 0.4 mg, 0.4 mg, Intravenous, PRN **AND** sodium chloride flush (NS) 0.9 % injection 3 mL, 3 mL, Intravenous, PRN, Phillips Grout, MD .  ondansetron (ZOFRAN) injection 4 mg, 4 mg, Intravenous, Q8H PRN, Phillips Grout, MD .  oxyCODONE (Oxy IR/ROXICODONE) immediate release tablet 10 mg, 10 mg, Oral, Q4H PRN, Conan Bowens, MD .  oxyCODONE (Oxy IR/ROXICODONE) immediate release tablet 5 mg, 5 mg, Oral, Q4H PRN, Conan Bowens, MD .  potassium chloride SA (K-DUR,KLOR-CON) CR tablet 40 mEq, 40 mEq, Oral, Daily, Jacobb Alen A, MD, 40 mEq at 11/08/17 0937 .  prenatal multivitamin tablet 1 tablet, 1 tablet, Oral, Q1200, Conan Bowens, MD, 1 tablet at 11/08/17 1114 .   senna-docusate (Senokot-S) tablet 2 tablet, 2 tablet, Oral, Q24H, Conan Bowens, MD, 2 tablet at 11/08/17 0112 .  simethicone (MYLICON) chewable tablet 80 mg, 80 mg, Oral, TID PC, Conan Bowens, MD, 80 mg at 11/08/17 0847 .  simethicone (MYLICON) chewable tablet 80 mg, 80 mg, Oral, Q24H, Conan Bowens, MD, 80 mg at 11/08/17 0111 .  simethicone (MYLICON) chewable tablet 80 mg, 80 mg, Oral, PRN, Conan Bowens, MD .  Tdap [facility]) injection 0.5 mL, 0.5 mL, Intramuscular, Once, Conan Bowens, MD .  zolpidem [facility]) tablet 5 mg, 5 mg, Oral, QHS PRN, Conan Bowens, MD Recent Labs    11/06/17 1507 11/07/17 0550  HGB 11.2* 9.6*  HCT 33.1* 28.6*    Assessment/Plan:  Patient is 27 y.o. G1P0101 POD#2 s/p 1LTCS at [redacted]w[redacted]d for non-reassuring fetal status during induction of labor for severe pre-eclampsia. She is doing very well,   She is s/p magnesium sulfate, no BP meds needed yet.  She will be enrolled in PP HTN Babyscripts Pain meds prn Regular diet Still undecided for birth control SW consult, baby up for adoption Continue routine post partum care Plan for discharge possibly tomorrow   Jaynie Collins, MD, FACOG Obstetrician & Gynecologist, [facility] for [employer], [facility]

## 2017-11-09 LAB — MUMPS ANTIBODY, IGM

## 2017-11-09 LAB — MUMPS ANTIBODY, IGG: Mumps IgG: <9 AU/mL — ABNORMAL LOW (ref >10.9)

## 2017-11-09 MED ORDER — MEASLES, MUMPS & RUBELLA VAC ~~LOC~~ INJ
0.5000 mL | INJECTION | Freq: Once | SUBCUTANEOUS | Status: AC
  Administered 2017-11-09: 0.5 mL via SUBCUTANEOUS
  Filled 2017-11-09: qty 0.5

## 2017-11-09 MED ORDER — FUROSEMIDE 40 MG PO TABS
40.0000 mg | ORAL_TABLET | Freq: Every day | ORAL | Status: DC
  Administered 2017-11-09: 40 mg via ORAL
  Filled 2017-11-09 (×2): qty 1

## 2017-11-09 MED ORDER — DOCUSATE SODIUM 100 MG PO CAPS: 100.0000 mg | ORAL_CAPSULE | Freq: Two times a day (BID) | ORAL | 2 refills | Status: DC | PRN

## 2017-11-09 MED ORDER — ENALAPRIL MALEATE 10 MG PO TABS
10.0000 mg | ORAL_TABLET | Freq: Every day | ORAL | Status: DC
  Administered 2017-11-09: 10 mg via ORAL
  Filled 2017-11-09 (×2): qty 1

## 2017-11-09 MED ORDER — ENALAPRIL MALEATE 10 MG PO TABS: 10.0000 mg | ORAL_TABLET | Freq: Every day | ORAL | 3 refills | Status: DC

## 2017-11-09 MED ORDER — IBUPROFEN 600 MG PO TABS: 600.0000 mg | ORAL_TABLET | Freq: Four times a day (QID) | ORAL | 2 refills | Status: DC

## 2017-11-09 MED ORDER — OXYCODONE HCL 5 MG PO TABS: 5.0000 mg | ORAL_TABLET | ORAL | 0 refills | Status: DC | PRN

## 2017-11-09 MED ORDER — FERROUS SULFATE 325 (65 FE) MG PO TABS: 325.0000 mg | ORAL_TABLET | Freq: Three times a day (TID) | ORAL | 3 refills | Status: DC

## 2017-11-09 MED ORDER — ASPIRIN EC 81 MG PO TBEC: 81.0000 mg | DELAYED_RELEASE_TABLET | Freq: Every day | ORAL | 2 refills | Status: DC

## 2017-11-09 NOTE — Discharge Summary (Signed)
OB Discharge Summary     Patient Name: Jill Evans DOB: 26-Dec-1990 MRN: [id number]  Date of admission: 10/28/2017 Delivering MD: Sloan Leiter   Date of discharge: 11/09/2017  Admitting diagnosis: 32.6 WKS, ABD PAIN Intrauterine pregnancy: [redacted]w[redacted]d    Secondary diagnosis:  Principal Problem:   Severe preeclampsia, delivered Active Problems:   Supervision of high risk pregnancy, antepartum   Late prenatal care   Hypothyroidism   Rh negative state in antepartum period   Obesity in pregnancy   GBS (group B Streptococcus carrier), +RV culture, currently pregnant   ADD (attention deficit disorder)   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 yo G1P0101 who was admitted at 338w3do the hospital 10/28/2017 for severe preeclampsia.  She was treated with antihypertensives, given betamethasone and FHR tracing remained reassuring.  She was observed inpatient until 34 weeks, then induction of labor was started and magnesium sulfate was initiated for eclampsia prophylaxis.  Patient had a very prolonged cervical ripening course over 3- 4 days, had closed cervix despite multiple interventions. She was finally augmented with various other methods on the fourth and fifth days of her labor course, but then had decreasing FHR variability, increasing need for IV anti-hypertensives requiring multiple IV doses of labetalol.  Cesarean delivery was recommended. The patient went for cesarean section due to Non-Reassuring FHR, and delivered a Viable infant,11/06/2017  Membrane Rupture Time/Date: 3:15 AM ,11/06/2017 .  Details of operation can be  found in separate operative note.  Patient had an uncomplicated postpartum course. She continued the magnesium sulfate until 24 hours postpartum. She was started on Vasotec for BP control and was enrolled in Postpartum HTN Babyscripts program.  On POD#2, she was noted to have right sided parotid gland swelling, no other symptoms.  Low suspicion was mumps, but checked Mumps IgG which showed she was nonimmune and she got MMR vaccine prior to discharge. Mumps IgM still pending at time of discharge, ID was notified. The swelling went down by POD#3.  She is ambulating, tolerating a regular diet, passing flatus, and urinating well.  Patient is discharged home in stable condition on 11/09/17.   Of note, patient is in the process of giving her infant up for adoption.                          Physical exam  Vitals:   11/08/17 1925 11/08/17 2334 11/09/17 0505 11/09/17 0809  BP: 134/87 (!) 145/83 (!) 143/98 136/77  Pulse: 70 76 69 73  Resp: '16 19 18 18  '$ Temp: 98.4 F (36.9 C) 98.7 F (37.1 C)  98.7 F (37.1 C)  TempSrc: Oral Oral    SpO2: 97% 99% 99% 97%  Weight:      Height:       General: alert, cooperative and no distress Lochia: appropriate Uterine Fundus: firm Incision: Dressing is clean, dry, and intact DVT Evaluation: No evidence of DVT seen on physical exam. Negative Homan's sign. No cords or calf tenderness. Labs: Lab Results  Component Value Date   WBC 13.5 (H) 11/07/2017   HGB 9.6 (L) 11/07/2017   HCT 28.6 (L) 11/07/2017   MCV 91.1 11/07/2017   PLT 234 11/07/2017   CMP Latest Ref Rng & Units 11/07/2017  Glucose 70 - 99 mg/dL -  BUN 6 - 20 mg/dL -  Creatinine 0.44 - 1.00 mg/dL 0.59  Sodium 135 - 145 mmol/L -  Potassium 3.5 - 5.1 mmol/L -  Chloride 98 - 111 mmol/L -  CO2 22 - 32 mmol/L -  Calcium 8.9 - 10.3 mg/dL -  Total Protein 6.5 - 8.1 g/dL -  Total Bilirubin 0.3 - 1.2 mg/dL -  Alkaline Phos 38 - 126 U/L -  AST 15 - 41 U/L -  ALT 0 - 44 U/L -    Discharge  instruction: per After Visit Summary and "Baby and Me Booklet".  After visit meds:  Allergies as of 11/09/2017   No Known Allergies     Medication List    TAKE these medications   acetaminophen 325 MG tablet Commonly known as:  TYLENOL Take 650 mg by mouth every 6 (six) hours as needed for mild pain.   aspirin EC 81 MG tablet Take 1 tablet (81 mg total) by mouth daily. Take after 12 weeks for prevention of preeclampsia later in pregnancy   docusate sodium 100 MG capsule Commonly known as:  COLACE Take 1 capsule (100 mg total) by mouth 2 (two) times daily as needed for mild constipation.   enalapril 10 MG tablet Commonly known as:  VASOTEC Take 1 tablet (10 mg total) by mouth daily. Start taking on:  11/10/2017   ferrous sulfate 325 (65 FE) MG tablet Take 1 tablet (325 mg total) by mouth 3 (three) times daily with meals.   ibuprofen 600 MG tablet Commonly known as:  ADVIL,MOTRIN Take 1 tablet (600 mg total) by mouth every 6 (six) hours.   levothyroxine 100 MCG tablet Commonly known as:  SYNTHROID, LEVOTHROID Take 1 tablet (100 mcg total) by mouth daily before breakfast.   oxyCODONE 5 MG immediate release tablet Commonly known as:  Oxy IR/ROXICODONE Take 1 tablet (5 mg total) by mouth every 4 (four) hours as needed for severe pain or breakthrough pain (pain scale 4-7).   prenatal multivitamin Tabs tablet Take 1 tablet by mouth daily at 12 noon.            Discharge Care Instructions  (From admission, onward)         Start     Ordered   11/09/17 0000  Discharge wound care:    Comments:  As per discharge handout and nursing instructions   11/09/17 1140          Diet: routine diet  Activity: Advance as tolerated. Pelvic rest for 6 weeks.    Future Appointments  Date Time Provider [place]  11/13/2017  9:00 AM [facility]   Postpartum contraception: Undecided  Newborn Data: Live born female  Birth Weight: 4 lb 4.4 oz (1940  g) APGAR: 5, 8  Newborn Delivery   Birth date/time:  11/06/2017 16:34:00 Delivery type:  C-Section, Low Transverse Trial of labor:  Yes C-section categorization:  Primary     11/09/2017 Verita Schneiders, MD

## 2017-11-09 NOTE — Progress Notes (Signed)
Patient has mild anemia postoperatively.  She is on iron therapy.  Jaynie Collins, MD

## 2017-11-09 NOTE — Discharge Instructions (Signed)
Cesarean Delivery, Care After °Refer to this sheet in the next few weeks. These instructions provide you with information about caring for yourself after your procedure. Your health care provider may also give you more specific instructions. Your treatment has been planned according to current medical practices, but problems sometimes occur. Call your health care provider if you have any problems or questions after your procedure. °What can I expect after the procedure? °After the procedure, it is common to have: °· A small amount of blood or clear fluid coming from the incision. °· Some redness, swelling, and pain in your incision area. °· Some abdominal pain and soreness. °· Vaginal bleeding (lochia). °· Pelvic cramps. °· Fatigue. ° °Follow these instructions at home: °Incision care ° °· Follow instructions from your health care provider about how to take care of your incision. Make sure you: °? Wash your hands with soap and water before you change your bandage (dressing). If soap and water are not available, use hand sanitizer. °? If you have a dressing, change it as told by your health care provider. °? Leave stitches (sutures), skin staples, skin glue, or adhesive strips in place. These skin closures may need to stay in place for 2 weeks or longer. If adhesive strip edges start to loosen and curl up, you may trim the loose edges. Do not remove adhesive strips completely unless your health care provider tells you to do that. °· Check your incision area every day for signs of infection. Check for: °? More redness, swelling, or pain. °? More fluid or blood. °? Warmth. °? Pus or a bad smell. °· When you cough or sneeze, hug a pillow. This helps with pain and decreases the chance of your incision opening up (dehiscing). Do this until your incision heals. °Medicines °· Take over-the-counter and prescription medicines only as told by your health care provider. °· If you were prescribed an antibiotic medicine, take it  as told by your health care provider. Do not stop taking the antibiotic until it is finished. °Driving °· Do not drive or operate heavy machinery while taking prescription pain medicine. °Lifestyle °· Do not drink alcohol. This is especially important if you are breastfeeding or taking pain medicine. °· Do not use tobacco products, including cigarettes, chewing tobacco, or e-cigarettes. If you need help quitting, ask your health care provider. Tobacco can delay wound healing. °Eating and drinking °· Drink at least 8 eight-ounce glasses of water every day unless told not to by your health care provider. If you breastfeed, you may need to drink more water than this. °· Eat high-fiber foods every day. These foods may help prevent or relieve constipation. High-fiber foods include: °? Whole grain cereals and breads. °? Brown rice. °? Beans. °? Fresh fruits and vegetables. °Activity °· Return to your normal activities as told by your health care provider. Ask your health care provider what activities are safe for you. °· Rest as much as possible. Try to rest or take a nap while your baby is sleeping. °· Do not lift anything that is heavier than your baby or 10 lb (4.5 kg) as told by your health care provider. °· Ask your health care provider when you can engage in sexual activity. This may depend on your: °? Risk of infection. °? Healing rate. °? Comfort and desire to engage in sexual activity. °Bathing °· Do not take baths, swim, or use a hot tub until your health care provider approves. Ask your health care provider if   you can take showers. You may only be allowed to take sponge baths until your incision heals. °General instructions °· Do not use tampons or douches until your health care provider approves. °· Wear: °? Loose, comfortable clothing. °? A supportive and well-fitting bra. °· Watch for any blood clots that may pass from your vagina. These may look like clumps of dark red, brown, or black discharge. °· Keep  your perineum clean and dry as told by your health care provider. °· Wipe from front to back when you use the toilet. °· If possible, have someone help you care for your baby and help with household activities for a few days after you leave the hospital. °· Keep all follow-up visits for you and your baby as told by your health care provider. This is important. °Contact a health care provider if: °· You have: °? Bad-smelling vaginal discharge. °? Difficulty urinating. °? Pain when urinating. °? A sudden increase or decrease in the frequency of your bowel movements. °? More redness, swelling, or pain around your incision. °? More fluid or blood coming from your incision. °? Pus or a bad smell coming from your incision. °? A fever. °? A rash. °? Little or no interest in activities you used to enjoy. °? Questions about caring for yourself or your baby. °? Nausea. °· Your incision feels warm to the touch. °· Your breasts turn red or become painful or hard. °· You feel unusually sad or worried. °· You vomit. °· You pass large blood clots from your vagina. If you pass a blood clot, save it to show to your health care provider. Do not flush blood clots down the toilet without showing your health care provider. °· You urinate more than usual. °· You are dizzy or light-headed. °· You have not breastfed and have not had a menstrual period for 12 weeks after delivery. °· You stopped breastfeeding and have not had a menstrual period for 12 weeks after stopping breastfeeding. °Get help right away if: °· You have: °? Pain that does not go away or get better with medicine. °? Chest pain. °? Difficulty breathing. °? Blurred vision or spots in your vision. °? Thoughts about hurting yourself or your baby. °? New pain in your abdomen or in one of your legs. °? A severe headache. °· You faint. °· You bleed from your vagina so much that you fill two sanitary pads in one hour. °This information is not intended to replace advice given to  you by your health care provider. Make sure you discuss any questions you have with your health care provider. °Document Released: 10/09/2001 Document Revised: 02/20/2016 Document Reviewed: 12/22/2014 °Elsevier Interactive Patient Education © 2018 [employer] ° ° ° °Preeclampsia and Eclampsia °Preeclampsia is a serious condition that develops only during pregnancy. It is also called toxemia of pregnancy. This condition causes high blood pressure along with other symptoms, such as swelling and headaches. These symptoms may develop as the condition gets worse. Preeclampsia may occur at 20 weeks of pregnancy or later. °Diagnosing and treating preeclampsia early is very important. If not treated early, it can cause serious problems for you and your baby. One problem it can lead to is eclampsia, which is a condition that causes muscle jerking or shaking (convulsions or seizures) in the mother. Delivering your baby is the best treatment for preeclampsia or eclampsia. Preeclampsia and eclampsia symptoms usually go away after your baby is born. °What are the causes? °The cause of preeclampsia is   not known. °What increases the risk? °The following risk factors make you more likely to develop preeclampsia: °· Being pregnant for the first time. °· Having had preeclampsia during a past pregnancy. °· Having a family history of preeclampsia. °· Having high blood pressure. °· Being pregnant with twins or triplets. °· Being 35 or older. °· Being African-American. °· Having kidney disease or diabetes. °· Having medical conditions such as lupus or blood diseases. °· Being very overweight (obese). ° °What are the signs or symptoms? °The earliest signs of preeclampsia are: °· High blood pressure. °· Increased protein in your urine. Your health care provider will check for this at every visit before you give birth (prenatal visit). ° °Other symptoms that may develop as the condition gets worse include: °· Severe headaches. °· Sudden  weight gain. °· Swelling of the hands, face, legs, and feet. °· Nausea and vomiting. °· Vision problems, such as blurred or double vision. °· Numbness in the face, arms, legs, and feet. °· Urinating less than usual. °· Dizziness. °· Slurred speech. °· Abdominal pain, especially upper abdominal pain. °· Convulsions or seizures. ° °Symptoms generally go away after giving birth. °How is this diagnosed? °There are no screening tests for preeclampsia. Your health care provider will ask you about symptoms and check for signs of preeclampsia during your prenatal visits. You may also have tests that include: °· Urine tests. °· Blood tests. °· Checking your blood pressure. °· Monitoring your baby’s heart rate. °· Ultrasound. ° °How is this treated? °You and your health care provider will determine the treatment approach that is best for you. Treatment may include: °· Having more frequent prenatal exams to check for signs of preeclampsia, if you have an increased risk for preeclampsia. °· Bed rest. °· Reducing how much salt (sodium) you eat. °· Medicine to lower your blood pressure. °· Staying in the hospital, if your condition is severe. There, treatment will focus on controlling your blood pressure and the amount of fluids in your body (fluid retention). °· You may need to take medicine (magnesium sulfate) to prevent seizures. This medicine may be given as an injection or through an IV tube. °· Delivering your baby early, if your condition gets worse. You may have your labor started with medicine (induced), or you may have a cesarean delivery. ° °Follow these instructions at home: °Eating and drinking ° °· Drink enough fluid to keep your urine clear or pale yellow. °· Eat a healthy diet that is low in sodium. Do not add salt to your food. Check nutrition labels to see how much sodium a food or beverage contains. °· Avoid caffeine. °Lifestyle °· Do not use any products that contain nicotine or tobacco, such as cigarettes and  e-cigarettes. If you need help quitting, ask your health care provider. °· Do not use alcohol or drugs. °· Avoid stress as much as possible. Rest and get plenty of sleep. °General instructions °· Take over-the-counter and prescription medicines only as told by your health care provider. °· When lying down, lie on your side. This keeps pressure off of your baby. °· When sitting or lying down, raise (elevate) your feet. Try putting some pillows underneath your lower legs. °· Exercise regularly. Ask your health care provider what kinds of exercise are best for you. °· Keep all follow-up and prenatal visits as told by your health care provider. This is important. °How is this prevented? °To prevent preeclampsia or eclampsia from developing during another pregnancy: °· Get proper   medical care during pregnancy. Your health care provider may be able to prevent preeclampsia or diagnose and treat it early. °· Your health care provider may have you take a low-dose aspirin or a calcium supplement during your next pregnancy. °· You may have tests of your blood pressure and kidney function after giving birth. °· Maintain a healthy weight. Ask your health care provider for help managing weight gain during pregnancy. °· Work with your health care provider to manage any long-term (chronic) health conditions you have, such as diabetes or kidney problems. ° °Contact a health care provider if: °· You gain more weight than expected. °· You have headaches. °· You have nausea or vomiting. °· You have abdominal pain. °· You feel dizzy or light-headed. °Get help right away if: °· You develop sudden or severe swelling anywhere in your body. This usually happens in the legs. °· You gain 5 lbs (2.3 kg) or more during one week. °· You have severe: °? Abdominal pain. °? Headaches. °? Dizziness. °? Vision problems. °? Confusion. °? Nausea or vomiting. °· You have a seizure. °· You have trouble moving any part of your body. °· You develop  numbness in any part of your body. °· You have trouble speaking. °· You have any abnormal bleeding. °· You pass out. °This information is not intended to replace advice given to you by your health care provider. Make sure you discuss any questions you have with your health care provider. °Document Released: 01/15/2000 Document Revised: 09/15/2015 Document Reviewed: 08/24/2015 °Elsevier Interactive Patient Education © 2018 Elsevier Inc. ° °

## 2017-11-09 NOTE — Progress Notes (Signed)
CSW, MOB, FOB, and adoption agency worked together to finalize all adoption documents. All document have been placed in infant's chart and bedside nurse has been update.   Adoption representative signed all visitation forms and visitation  policy information was provided to adoption agency and adopting parents.   CSW will continue offer resources and supports to adopting agency and family while infant remains in NICU.    Jill Evans, MSW, LCSW Clinical Social Work [phone]   

## 2017-11-10 ENCOUNTER — Other Ambulatory Visit: Payer: Self-pay | Admitting: Family Medicine

## 2017-11-10 DIAGNOSIS — O1414 Severe pre-eclampsia complicating childbirth: Secondary | ICD-10-CM

## 2017-11-10 LAB — TYPE AND SCREEN
ABO/RH(D): B NEG
ANTIBODY SCREEN: POSITIVE
UNIT DIVISION: 0
Unit division: 0

## 2017-11-10 LAB — BPAM RBC
Blood Product Expiration Date: 201911012359
Blood Product Expiration Date: 201911052359
UNIT TYPE AND RH: 9500
Unit Type and Rh: 9500

## 2017-11-10 MED ORDER — ENALAPRIL MALEATE 20 MG PO TABS: 10.0000 mg | ORAL_TABLET | Freq: Every day | ORAL | 0 refills | Status: DC

## 2017-11-10 NOTE — Progress Notes (Signed)
  Babyscripts triggered. BP's trending up. On Vasotec 10. My Chart message sent to double medication to 20 mg. New Rx sent in.

## 2017-11-13 ENCOUNTER — Ambulatory Visit (INDEPENDENT_AMBULATORY_CARE_PROVIDER_SITE_OTHER): Payer: BLUE CROSS/BLUE SHIELD | Admitting: General Practice

## 2017-11-13 VITALS — BP 122/77 | HR 68 | Ht 59.0 in | Wt 157.0 lb

## 2017-11-13 DIAGNOSIS — Z0130 Encounter for examination of blood pressure without abnormal findings: Secondary | ICD-10-CM

## 2017-11-13 DIAGNOSIS — Z5189 Encounter for other specified aftercare: Secondary | ICD-10-CM

## 2017-11-13 NOTE — Progress Notes (Signed)
Patient presents to office today for wound check & BP check following primary c-section on 10/7. Patient reports taking BP medication daily and denies headaches, dizziness, & blurry vision. Wound vac was still in place, so I removed it. Incision is dry & clean. Few small less than 1 cm superficial openings in incision where it has not approximated yet. Steri strips applied. Patient has 2cm x 2cm area of raw excoriated skin likely from tape on her left side away from incision- recommended applying A&D ointment for healing. Wound care and signs & symptoms of infection reviewed with patient. Recommended she continue medications as is & continue with blood pressure checks at home. Patient will follow up at pp visit. Patient verbalized understanding to all.

## 2017-11-15 NOTE — Progress Notes (Signed)
Agree with A & P. 

## 2017-11-20 ENCOUNTER — Ambulatory Visit: Payer: BLUE CROSS/BLUE SHIELD

## 2017-12-04 ENCOUNTER — Ambulatory Visit (INDEPENDENT_AMBULATORY_CARE_PROVIDER_SITE_OTHER): Payer: BLUE CROSS/BLUE SHIELD | Admitting: Obstetrics & Gynecology

## 2017-12-04 ENCOUNTER — Encounter: Payer: Self-pay | Admitting: Obstetrics & Gynecology

## 2017-12-04 DIAGNOSIS — O1414 Severe pre-eclampsia complicating childbirth: Secondary | ICD-10-CM

## 2017-12-04 NOTE — Patient Instructions (Signed)
Return to clinic for any scheduled appointments or for any gynecologic concerns as needed.   

## 2017-12-04 NOTE — Progress Notes (Signed)
     Subjective:     Jill Evans is a 27 y.o.G1P0101 female who presents for a postpartum visit. She is 4 weeks postpartum following a low cervical transverse Cesarean section at [redacted]w[redacted]d after failed IOL for severe preeclampsia. I have fully reviewed the prenatal, intrapartum and postpartum course. The delivery was at [redacted]w[redacted]d gestational weeks. Outcome: primary cesarean section, low transverse incision. Anesthesia: epidural. Postpartum course has been complicated by anxiety and continued elevated BP, on Vasotec.  Also, baby was placed up for adoption. Had heavy bleeding initially, none currently. No anemia symptoms. Bowel function is normal. Bladder function is normal. Patient is not sexually active. Contraception method is condoms. Postpartum depression screening: positive at 11, no SI/HI, already seeing a therapist.  The following portions of the patient's history were reviewed and updated as appropriate: allergies, current medications, past family history, past medical history, past social history, past surgical history and problem list. Normal pap on 10/10/2017.  Review of Systems Pertinent items noted in HPI and remainder of comprehensive ROS otherwise negative.   Objective:    BP 124/70   Pulse 97   Wt 151 lb 8 oz (68.7 kg)   Breastfeeding? No   BMI 30.60 kg/m   General:  alert and no distress   Breasts:  deferred  Lungs: clear to auscultation bilaterally  Heart:  regular rate and rhythm  Abdomen: soft, non-tender; bowel sounds normal; no masses,  no organomegaly. Incision C/D/I, no erythema, no drainage, no induration.   Vulva:  normal  Vagina: normal vagina, no discharge, exudate, lesion, or erythema  Cervix:  no lesions and nulliparous appearance  Corpus: not examined  Adnexa:  not evaluated  Rectal Exam: Not performed.        Assessment:     Normal postpartum exam. Pap smear not done at today's visit.   Plan:   1. Contraception: condoms. Considering Nexplanon vs  progestin IUD, talked about these in detail, information given to her to review. Will let us know when she wants LARC insertion.  2. Continue Vasotec for BP 3. Follow up as needed.     Jaynie Collins, MD, FACOG Obstetrician & Gynecologist, [facility] for [employer], [facility]

## 2018-01-02 ENCOUNTER — Encounter: Payer: Self-pay | Admitting: Student

## 2018-01-02 ENCOUNTER — Ambulatory Visit (INDEPENDENT_AMBULATORY_CARE_PROVIDER_SITE_OTHER): Payer: BLUE CROSS/BLUE SHIELD | Admitting: Student

## 2018-01-02 VITALS — BP 138/80 | HR 90 | Wt 151.4 lb

## 2018-01-02 DIAGNOSIS — Z30014 Encounter for initial prescription of intrauterine contraceptive device: Secondary | ICD-10-CM | POA: Diagnosis not present

## 2018-01-02 HISTORY — DX: Encounter for initial prescription of intrauterine contraceptive device: Z30.014

## 2018-01-02 LAB — POCT PREGNANCY, URINE: Preg Test, Ur: NEGATIVE

## 2018-01-02 MED ORDER — LEVONORGESTREL 20 MCG/24HR IU IUD
INTRAUTERINE_SYSTEM | Freq: Once | INTRAUTERINE | Status: AC
  Administered 2018-01-02: 1 via INTRAUTERINE

## 2018-01-02 NOTE — Progress Notes (Signed)
     GYNECOLOGY CLINIC PROCEDURE NOTE  Jill Evans is a 27 y.o. G1P0101 here for Mirena IUD insertion. No GYN concerns.  Last pap smear was on 10/2017 and was normal. UPT negative today.   IUD Insertion Procedure Note Patient identified, informed consent performed, consent signed.   Discussed risks of irregular bleeding, cramping, infection, malpositioning or misplacement of the IUD outside the uterus which may require further procedure such as laparoscopy. Time out was performed.  Urine pregnancy test negative.  Speculum placed in the vagina.  Cervix visualized.  Cleaned with Betadine x 2.  Grasped anteriorly with a single tooth tenaculum.  Mirena IUD placed per manufacturer's recommendations.  Strings trimmed to 3 cm. Tenaculum was removed, good hemostasis noted.  Patient tolerated procedure well.   Patient was given post-procedure instructions.  She was advised to have backup contraception for one week.  Patient was also asked to check IUD strings periodically and follow up in 4 weeks for IUD check.   Luna KitchensKathryn Kooistra CNM

## 2018-01-02 NOTE — Patient Instructions (Addendum)

## 2018-01-02 NOTE — Addendum Note (Signed)
Addended by: Faythe CasaBELLAMY, Tyrel Lex M on: 01/02/2018 07:40 PM   Modules accepted: Orders

## 2018-01-07 ENCOUNTER — Other Ambulatory Visit: Payer: Self-pay | Admitting: Family Medicine

## 2018-01-07 DIAGNOSIS — O1414 Severe pre-eclampsia complicating childbirth: Secondary | ICD-10-CM

## 2018-02-06 ENCOUNTER — Ambulatory Visit: Payer: BLUE CROSS/BLUE SHIELD | Admitting: Student

## 2018-03-13 ENCOUNTER — Telehealth: Payer: Self-pay | Admitting: Student

## 2018-03-13 NOTE — Telephone Encounter (Signed)
Opened in error

## 2018-07-01 ENCOUNTER — Encounter (HOSPITAL_COMMUNITY): Payer: Self-pay | Admitting: Emergency Medicine

## 2018-07-01 ENCOUNTER — Other Ambulatory Visit: Payer: Self-pay

## 2018-07-01 ENCOUNTER — Emergency Department (HOSPITAL_COMMUNITY)
Admission: EM | Admit: 2018-07-01 | Discharge: 2018-07-01 | Disposition: A | Discharge status: Home / Self Care | Payer: Medicaid Other | Attending: Emergency Medicine | Admitting: Emergency Medicine

## 2018-07-01 DIAGNOSIS — S91209A Unspecified open wound of unspecified toe(s) with damage to nail, initial encounter: Secondary | ICD-10-CM | POA: Diagnosis not present

## 2018-07-01 DIAGNOSIS — W228XXA Striking against or struck by other objects, initial encounter: Secondary | ICD-10-CM | POA: Insufficient documentation

## 2018-07-01 DIAGNOSIS — Y999 Unspecified external cause status: Secondary | ICD-10-CM | POA: Diagnosis not present

## 2018-07-01 DIAGNOSIS — E039 Hypothyroidism, unspecified: Secondary | ICD-10-CM | POA: Diagnosis not present

## 2018-07-01 DIAGNOSIS — Y9301 Activity, walking, marching and hiking: Secondary | ICD-10-CM | POA: Insufficient documentation

## 2018-07-01 DIAGNOSIS — Z79899 Other long term (current) drug therapy: Secondary | ICD-10-CM | POA: Diagnosis not present

## 2018-07-01 DIAGNOSIS — Y929 Unspecified place or not applicable: Secondary | ICD-10-CM | POA: Insufficient documentation

## 2018-07-01 DIAGNOSIS — S99921A Unspecified injury of right foot, initial encounter: Secondary | ICD-10-CM

## 2018-07-01 MED ORDER — LIDOCAINE HCL 2 % IJ SOLN
10.0000 mL | Freq: Once | INTRAMUSCULAR | Status: AC
  Administered 2018-07-01: 04:00:00 10 mL
  Filled 2018-07-01: qty 20

## 2018-07-01 MED ORDER — CEPHALEXIN 500 MG PO CAPS: 500.0000 mg | ORAL_CAPSULE | Freq: Four times a day (QID) | ORAL | 0 refills | Status: AC

## 2018-07-01 NOTE — ED Triage Notes (Signed)
Pt reports walking to bathroom and hitting right great toe. Pt reports prior ingrown toenail to toe.

## 2018-07-01 NOTE — ED Notes (Signed)
Bed: WLPT3 Expected date:  Expected time:  Means of arrival:  Comments: 

## 2018-07-01 NOTE — ED Provider Notes (Signed)
[facility]-EMERGENCY DEPT Provider Note   CSN: [id number] Arrival date & time: 07/01/18  0150    History   Chief Complaint Chief Complaint  Patient presents with  . Toe Injury    HPI Idalia Needleaige A Juanito DoomFarrugia is a 28 y.o. female with a history of hypothyroidism and recurrent ingrown toenails who presents to the emergency department with a chief complaint of right great toe pain.  The patient reports that she tripped over her own feet while walking and the ingrown toe on her right great toe partially came off.  Wound is hemostatic.  She denies pain to the joints or to the right foot.  No numbness or weakness.  No fever or chills.   She is not currently established with a podiatrist as she does not have medical insurance.  She has been ambulatory since the injury.  She did not fall and hit her head have a syncopal episode, nausea, vomiting.  She reports mild pain associated with the digit.  Pain is worse with weightbearing.  No treatment prior to arrival.     The history is provided by the patient. No language interpreter was used.    Past Medical History:  Diagnosis Date  . Hyperglycemia   . Hypothyroidism   . 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 Pregnancy induced hypertension     Patient Active Problem List   Diagnosis Date Noted  . Encounter for initial prescription of intrauterine contraceptive device (IUD) 01/02/2018  . 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 GBS (group B Streptococcus carrier), +RV culture, currently pregnant 10/27/2017  . Malpresentation before onset of labor 10/26/2017  . Rh negative state in antepartum period 10/26/2017  . Supervision of high risk pregnancy due to social problems 10/26/2017  . Obesity in pregnancy 10/26/2017  . BMI 30s 10/26/2017  . Hypothyroidism   . Severe preeclampsia, delivered 10/24/2017  . Supervision of high risk pregnancy, antepartum 10/10/2017  . Late prenatal care 10/10/2017  . Vitamin D deficiency  06/30/2015  . ADD (attention deficit disorder) 05/23/2014    Past Surgical History:  Procedure Laterality Date  . CESAREAN SECTION N/A 11/06/2017   Procedure: CESAREAN SECTION;  Surgeon: Conan Bowensavis, Kelly M, MD;  [facility];  Service: Obstetrics;  Laterality: N/A;  . NO PAST SURGERIES       OB History    Gravida  1   Para  1   Term      Preterm  1   AB      Living  1     SAB      TAB      Ectopic      Multiple  0   Live Births  1            Home Medications    Prior to Admission medications   Medication Sig Start Date End Date Taking? Authorizing Provider  acetaminophen (TYLENOL) 325 MG tablet Take 650 mg by mouth every 6 (six) hours as needed for mild pain.    [provider]  cephALEXin (KEFLEX) 500 MG capsule Take 1 capsule (500 mg total) by mouth 4 (four) times daily for 7 days. 07/01/18 07/08/18  McDonald, Mia A, PA-C  enalapril (VASOTEC) 20 MG tablet TAKE ONE-HALF TABLET BY MOUTH DAILY 01/08/18   Reva BoresPratt, Tanya S, MD  ibuprofen (ADVIL,MOTRIN) 600 MG tablet Take 1 tablet (600 mg total) by mouth every 6 (six) hours. 11/09/17   Tereso NewcomerAnyanwu, Ugonna A, MD  levothyroxine (  SYNTHROID, LEVOTHROID) 100 MCG tablet Take 1 tablet (100 mcg total) by mouth daily before breakfast. 10/28/17   Lazaro Arms, MD  oxyCODONE (OXY IR/ROXICODONE) 5 MG immediate release tablet Take 1 tablet (5 mg total) by mouth every 4 (four) hours as needed for severe pain or breakthrough pain (pain scale 4-7). 11/09/17   Tereso Newcomer, MD    Family History Family History  Problem Relation Age of Onset  . Deep vein thrombosis Mother   . Hypertension Mother   . Obesity Mother   . Hypertension Father   . Cancer Father   . Obesity Father     Social History Social History   Tobacco Use  . Smoking status: Never Smoker  . Smokeless tobacco: Never Used  Substance Use Topics  . Alcohol use: No  . Drug use: No     Allergies   Patient has no known allergies.    Review of Systems Review of Systems  Constitutional: Negative for activity change, chills and fever.  Respiratory: Negative for shortness of breath.   Cardiovascular: Negative for chest pain.  Gastrointestinal: Negative for abdominal pain.  Genitourinary: Negative for dysuria.  Musculoskeletal: Positive for arthralgias and myalgias. Negative for back pain and joint swelling.  Skin: Positive for wound. Negative for rash.  Allergic/Immunologic: Negative for immunocompromised state.  Neurological: Negative for dizziness, weakness, numbness and headaches.  Psychiatric/Behavioral: Negative for confusion.     Physical Exam Updated Vital Signs BP (!) 134/92 (BP Location: Left Arm)   Pulse 88   Temp 98.4 F (36.9 C) (Oral)   Resp 18   Ht 4\' 11"  (1.499 m)   Wt 68 kg   LMP 05/14/2018   SpO2 98%   BMI 30.30 kg/m   Physical Exam Vitals signs and nursing note reviewed.  Constitutional:      General: She is not in acute distress. HENT:     Head: Normocephalic.  Eyes:     Conjunctiva/sclera: Conjunctivae normal.  Neck:     Musculoskeletal: Neck supple.  Cardiovascular:     Rate and Rhythm: Normal rate and regular rhythm.     Heart sounds: No murmur. No friction rub. No gallop.   Pulmonary:     Effort: Pulmonary effort is normal. No respiratory distress.  Abdominal:     General: There is no distension.     Palpations: Abdomen is soft.  Musculoskeletal:     Comments: Partially avulsed toenail to the right great toe.  Toenail is thickened and ingrown.  Wound is hemostatic.  No surrounding erythema, edema, warmth, purulent drainage.  5-5 strength against resistance.  She is neurovascularly intact.  Skin:    General: Skin is warm.     Findings: No rash.  Neurological:     Mental Status: She is alert.  Psychiatric:        Behavior: Behavior normal.      ED Treatments / Results  Labs (all labs ordered are listed, but only abnormal results are displayed) Labs Reviewed - No  data to display  EKG None  Radiology No results found.  Procedures .Nail Removal Date/Time: 07/01/2018 8:41 AM Performed by: Barkley Boards, PA-C Authorized by: Barkley Boards, PA-C   Consent:    Consent obtained:  Verbal   Consent given by:  Patient   Risks discussed:  Bleeding, infection and pain   Alternatives discussed:  No treatment Location:    Foot:  R big toe Pre-procedure details:    Skin preparation:  Alcohol and  Betadine Anesthesia (see MAR for exact dosages):    Anesthesia method:  Nerve block   Block location:  Right great toe    Block anesthetic:  Lidocaine 2% w/o epi   Block technique:  Digital block   Block injection procedure:  Anatomic landmarks identified, introduced needle, incremental injection, negative aspiration for blood and anatomic landmarks palpated   Block outcome:  Anesthesia achieved Nail Removal:    Nail removed:  Complete   Nail bed repaired: no   Ingrown nail:    Wedge excision of skin: yes     Nail matrix removed or ablated:  Complete Post-procedure details:    Dressing:  4x4 sterile gauze and antibiotic ointment   Patient tolerance of procedure:  Tolerated well, no immediate complications   (including critical care time)  Medications Ordered in ED Medications  lidocaine (XYLOCAINE) 2 % (with pres) injection 200 mg (10 mLs Infiltration Given 07/01/18 0338)     Initial Impression / Assessment and Plan / ED Course  I have reviewed the triage vital signs and the nursing notes.  Pertinent labs & imaging results that were available during my care of the patient were reviewed by me and considered in my medical decision making (see chart for details).        28 year old female with a history of hypothyroidism and recurrent ingrown toenails presenting with an injury to the right great toe.  She is having no pain to the digit aside from the nail.  She is neurovascular intact.  On exam, the toenail is partially avulsed.  She reports  that she has had previous procedures from her former podiatrist to try and prevent regrowth of the toenail given recurrent ingrown nails.  We discussed the risk versus benefits that she is requesting complete avulsion of the nail.  Digital block was performed, which was successful.  The entire nail was successfully removed.  The wound remained hemostatic.  Will cover the patient with Keflex for secondary infection.  She was given information on home wound care and soaks as well as rice therapy for pain control and swelling.  She has been given a referral to [facility] health and wellness as she is not currently insured.  All questions answered.  She is hemodynamically stable and in no acute distress.  Return precautions to the ER were given.  Safe for discharge to home with outpatient follow-up at this time.  Final Clinical Impressions(s) / ED Diagnoses   Final diagnoses:  Injury of right great toe, initial encounter  Avulsion of toenail, initial encounter    ED Discharge Orders         Ordered    cephALEXin (KEFLEX) 500 MG capsule  4 times daily     07/01/18 0514           Frederik Pear A, PA-C 07/01/18 0981    Zadie Rhine, MD 07/02/18 [id number]

## 2018-07-01 NOTE — Discharge Instructions (Signed)
Thank you for allowing me to care for you today in the Emergency Department.   Keflex is an antibiotic.  Take 1 tablet of Keflex every 6 hours for the next week to prevent infection of the toe.  Apply an ice pack for 15 to 20 minutes as frequently as needed to help with pain and swelling.  You can take 600 mg of ibuprofen with food once every 6 hours for pain control.  You can also take your home pain medication as directed.  Change the dressing on your toe every 12-24 hours.  You may need to change the dressing more frequently at first if the dressing gets soiled.  I would recommend soaking your foot in warm water or and Epson salt before changing the dressing as this may help the dressing not to stick to your toe.  You can use Epson salts baths 2-3 times daily to help with swelling and inflammation or Betadine soaks to help reduce the risk for infection.  Keep your wound covered both day and night for the first week.  After the first week, you can let your toe remain uncovered at night to help the wound to heal.  Make sure that you are elevating your right foot so that your toes are at or above the level of your heart as frequently as possible for the first 24 to 48 hours.  Return to the emergency department if you develop fevers, chills, if the pain significantly worsens, if you develop red streaking up the foot, if you start to develop thick, mucus-like drainage or pus from the toe, if the toe gets red and hot to the touch, or other new, concerning symptoms.

## 2018-12-20 IMAGING — US US FETAL BPP W/ NON-STRESS
1 series · 11 of 11 positions shown · non-contrast
Comparison: none

[Series 1: us fetal bpp w/nonstress · 11 acquisitions, 11 frames shown]
[im 1/11]
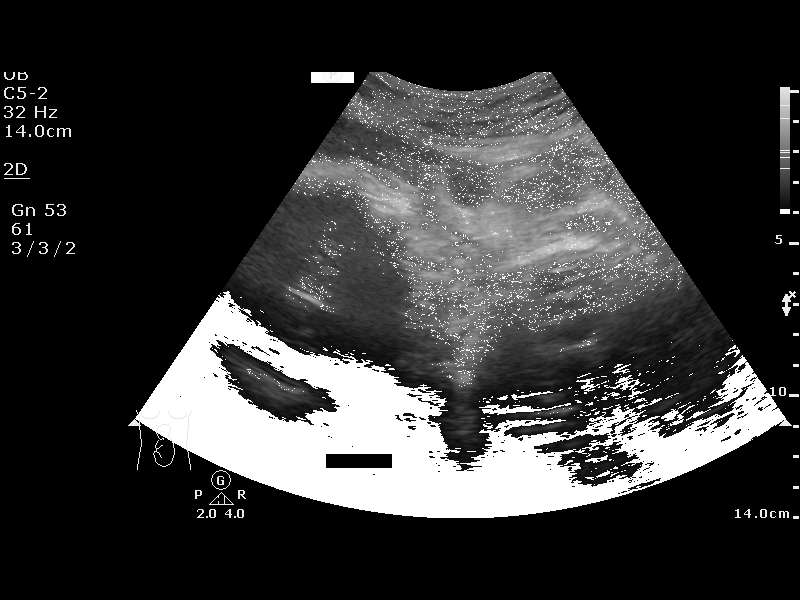
[im 2/11]
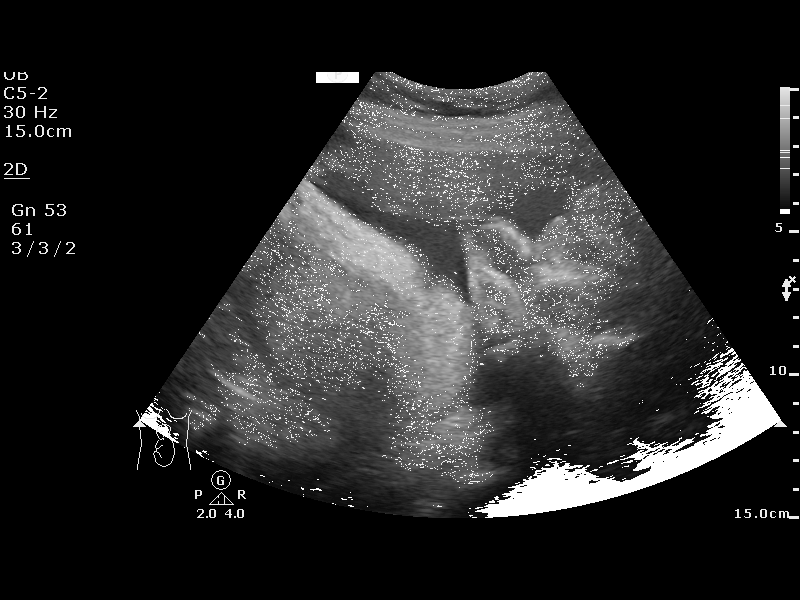
[im 3/11]
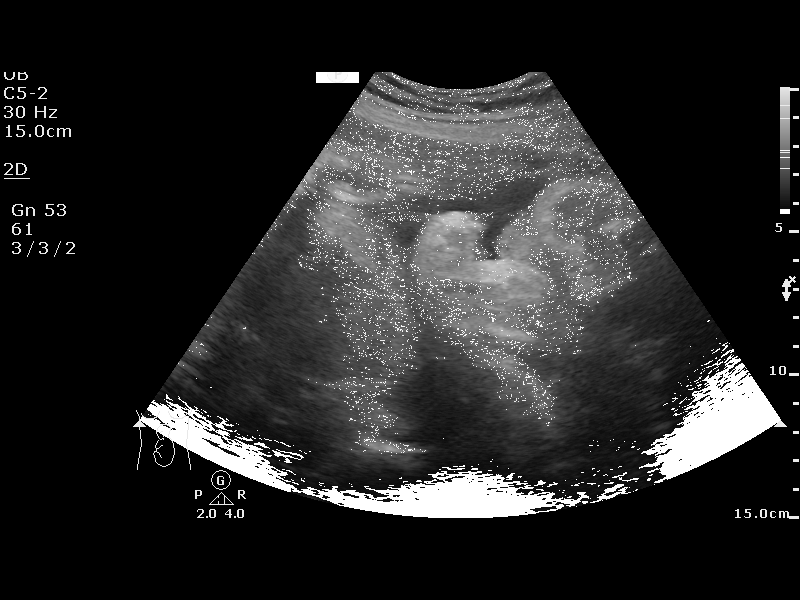
[im 4/11]
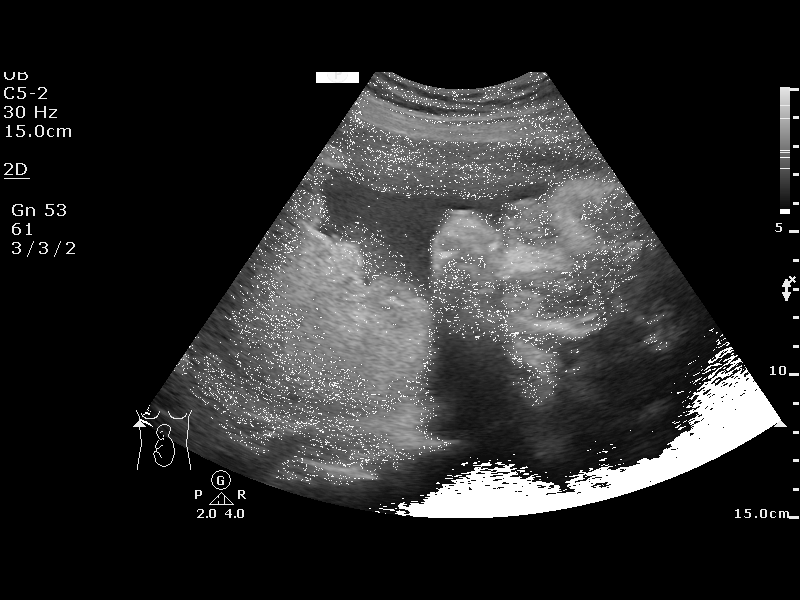
[im 5/11]
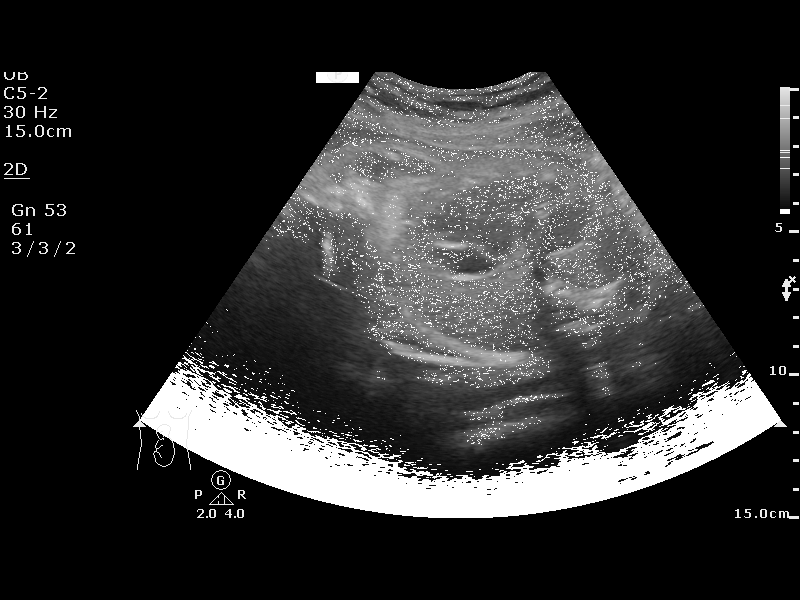
[im 6/11]
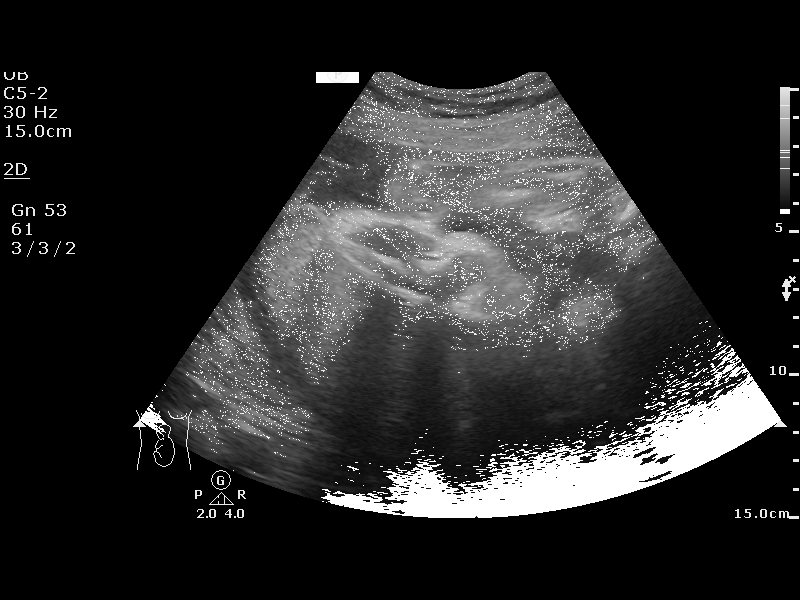
[im 7/11]
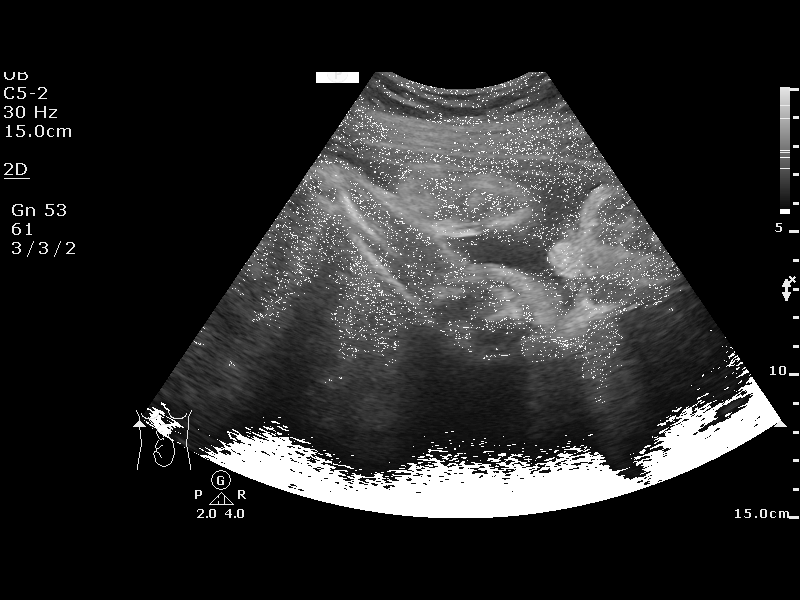
[im 8/11]
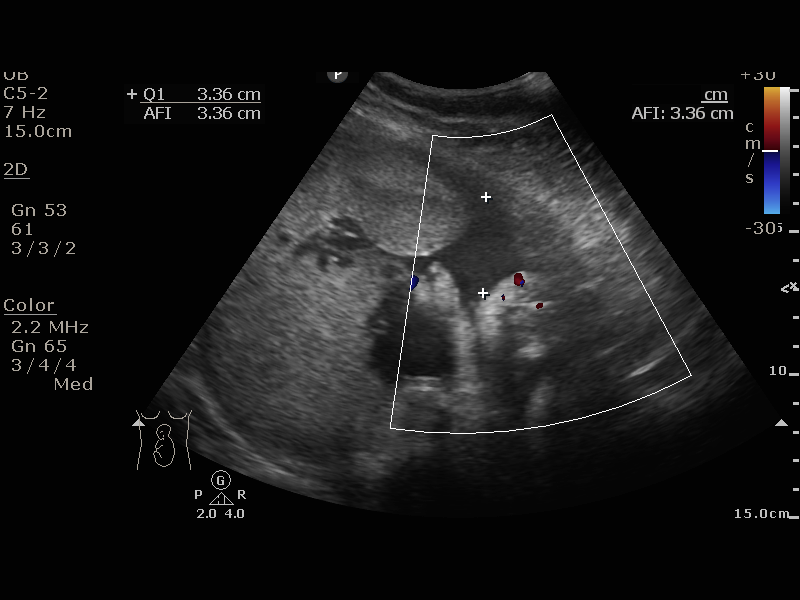
[im 9/11]
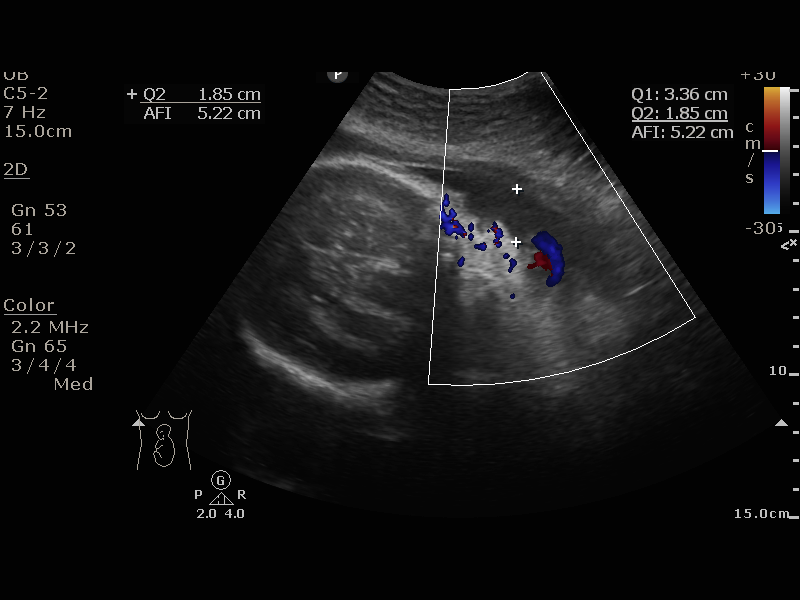
[im 10/11]
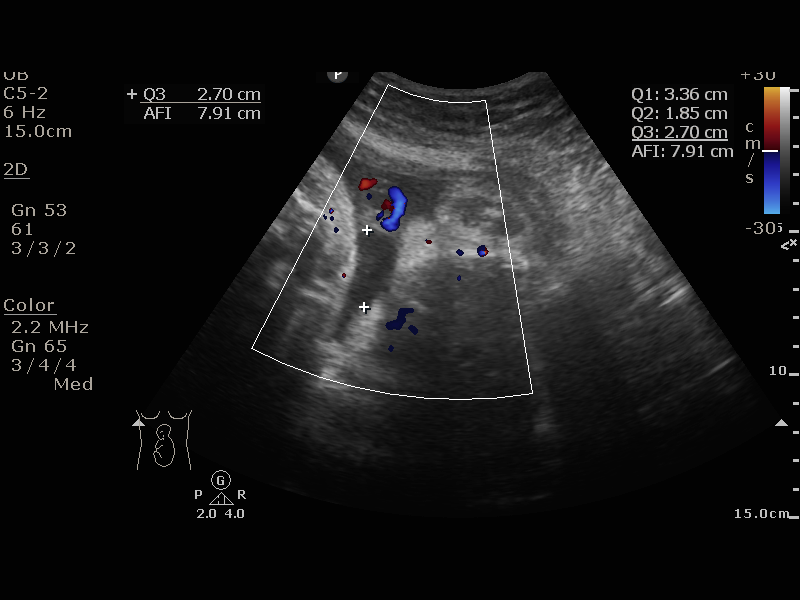
[im 11/11]
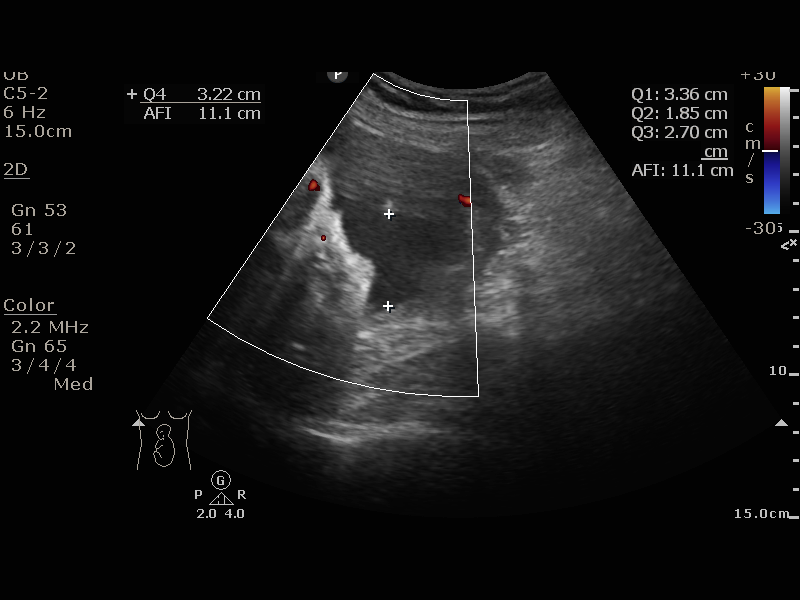

[11 of 11 positions shown; findings below may reference images not displayed]

Women's
[REDACTED]

1  US FETAL BPP W/NONSTRESS             76818.4      ALMIR ELDINA ERRATH

Service(s) Provided

Indications

32 weeks gestation of pregnancy

Hypertension - Gestational
Vital Signs

Height:        4'11"
Fetal Evaluation

Num Of Fetuses:         1
Preg. Location:         Intrauterine
Cardiac Activity:       Observed
Presentation:           Frank breech

Amniotic Fluid
AFI FV:      Within normal limits

AFI Sum(cm)     %Tile       Largest Pocket(cm)
11.13           26

RUQ(cm)       RLQ(cm)       LUQ(cm)        LLQ(cm)
3.36
Biophysical Evaluation

Amniotic F.V:   Pocket => 2 cm two         F. Tone:        Observed
planes
F. Movement:    Observed                   N.S.T:          Reactive
F. Breathing:   Not Observed               Score:          [DATE]
Gestational Age

LMP:           47w 2d        Date:  11/27/16                 EDD:   09/03/17
Best:          32w 6d     Det. By:  U/S  (09/21/17)          EDD:   12/13/17
Impression

Normal amniotic fluid volume.
Antenatal testing is reassuring with BPP [DATE].
Recommendations

Continue weekly antenatal testing till delivery.

## 2020-04-12 ENCOUNTER — Emergency Department (HOSPITAL_COMMUNITY)
Admission: EM | Admit: 2020-04-12 | Discharge: 2020-04-12 | Disposition: A | Discharge status: Home / Self Care | Payer: BLUE CROSS/BLUE SHIELD | Attending: Emergency Medicine | Admitting: Emergency Medicine

## 2020-04-12 ENCOUNTER — Emergency Department (HOSPITAL_COMMUNITY): Payer: BLUE CROSS/BLUE SHIELD

## 2020-04-12 ENCOUNTER — Encounter (HOSPITAL_COMMUNITY): Payer: Self-pay

## 2020-04-12 DIAGNOSIS — E039 Hypothyroidism, unspecified: Secondary | ICD-10-CM | POA: Diagnosis not present

## 2020-04-12 DIAGNOSIS — R0781 Pleurodynia: Secondary | ICD-10-CM | POA: Insufficient documentation

## 2020-04-12 DIAGNOSIS — F410 Panic disorder [episodic paroxysmal anxiety] without agoraphobia: Secondary | ICD-10-CM | POA: Diagnosis not present

## 2020-04-12 DIAGNOSIS — R4182 Altered mental status, unspecified: Secondary | ICD-10-CM | POA: Insufficient documentation

## 2020-04-12 DIAGNOSIS — Z79899 Other long term (current) drug therapy: Secondary | ICD-10-CM | POA: Diagnosis not present

## 2020-04-12 DIAGNOSIS — R0602 Shortness of breath: Secondary | ICD-10-CM | POA: Insufficient documentation

## 2020-04-12 LAB — CBC WITH DIFFERENTIAL/PLATELET
Abs Immature Granulocytes: 0.03 10*3/uL (ref 0.00–0.07)
Basophils Absolute: 0.1 10*3/uL (ref 0.0–0.1)
Basophils Relative: 1 %
Eosinophils Absolute: 0.4 10*3/uL (ref 0.0–0.5)
Eosinophils Relative: 4 %
HCT: 38.7 % (ref 36.0–46.0)
Hemoglobin: 13.2 g/dL (ref 12.0–15.0)
Immature Granulocytes: 0 %
Lymphocytes Relative: 30 %
Lymphs Abs: 2.9 10*3/uL (ref 0.7–4.0)
MCH: 30.8 pg (ref 26.0–34.0)
MCHC: 34.1 g/dL (ref 30.0–36.0)
MCV: 90.4 fL (ref 80.0–100.0)
Monocytes Absolute: 0.5 10*3/uL (ref 0.1–1.0)
Monocytes Relative: 5 %
Neutro Abs: 5.8 10*3/uL (ref 1.7–7.7)
Neutrophils Relative %: 60 %
Platelets: 383 10*3/uL (ref 150–400)
RBC: 4.28 MIL/uL (ref 3.87–5.11)
RDW: 12.9 % (ref 11.5–15.5)
WBC: 9.7 10*3/uL (ref 4.0–10.5)
nRBC: 0 % (ref 0.0–0.2)

## 2020-04-12 LAB — COMPREHENSIVE METABOLIC PANEL
ALT: 21 U/L (ref 0–44)
AST: 24 U/L (ref 15–41)
Albumin: 4.8 g/dL (ref 3.5–5.0)
Alkaline Phosphatase: 58 U/L (ref 38–126)
Anion gap: 9 (ref 5–15)
BUN: 19 mg/dL (ref 6–20)
CO2: 26 mmol/L (ref 22–32)
Calcium: 9.5 mg/dL (ref 8.9–10.3)
Chloride: 104 mmol/L (ref 98–111)
Creatinine, Ser: 0.95 mg/dL (ref 0.44–1.00)
GFR, Estimated: >60 mL/min (ref >60)
Glucose, Bld: 80 mg/dL (ref 70–99)
Potassium: 3.3 mmol/L — ABNORMAL LOW (ref 3.5–5.1)
Sodium: 139 mmol/L (ref 135–145)
Total Bilirubin: 1.1 mg/dL (ref 0.3–1.2)
Total Protein: 8.7 g/dL — ABNORMAL HIGH (ref 6.5–8.1)

## 2020-04-12 LAB — TROPONIN I (HIGH SENSITIVITY): Troponin I (High Sensitivity): 2 ng/L (ref <18)

## 2020-04-12 LAB — BLOOD GAS, VENOUS
Acid-Base Excess: 1.6 mmol/L (ref 0.0–2.0)
Bicarbonate: 26.5 mmol/L (ref 20.0–28.0)
O2 Saturation: 44.1 %
Patient temperature: 98.6
pCO2, Ven: 45 mmHg (ref 44.0–60.0)
pH, Ven: 7.388 (ref 7.250–7.430)
pO2, Ven: <31 mmHg — CL (ref 32.0–45.0)

## 2020-04-12 LAB — I-STAT BETA HCG BLOOD, ED (MC, WL, AP ONLY): I-stat hCG, quantitative: <5 m[IU]/mL (ref <5)

## 2020-04-12 LAB — D-DIMER, QUANTITATIVE: D-Dimer, Quant: <0.27 ug/mL-FEU (ref 0.00–0.50)

## 2020-04-12 MED ORDER — METOCLOPRAMIDE HCL 5 MG/ML IJ SOLN
5.0000 mg | Freq: Once | INTRAMUSCULAR | Status: DC
  Filled 2020-04-12: qty 2

## 2020-04-12 MED ORDER — SODIUM CHLORIDE 0.9 % IV BOLUS: 1000.0000 mL | Freq: Once | INTRAVENOUS | Status: DC

## 2020-04-12 MED ORDER — METOCLOPRAMIDE HCL 10 MG PO TABS: 5.0000 mg | ORAL_TABLET | Freq: Once | ORAL | Status: DC

## 2020-04-12 MED ORDER — METOCLOPRAMIDE HCL 5 MG/ML IJ SOLN
5.0000 mg | Freq: Once | INTRAMUSCULAR | Status: AC
  Administered 2020-04-12: 5 mg via INTRAVENOUS

## 2020-04-12 MED ORDER — FENTANYL CITRATE (PF) 100 MCG/2ML IJ SOLN
50.0000 ug | Freq: Once | INTRAMUSCULAR | Status: AC
  Administered 2020-04-12: 50 ug via INTRAVENOUS
  Filled 2020-04-12: qty 2

## 2020-04-12 MED ORDER — LORAZEPAM 1 MG PO TABS
1.0000 mg | ORAL_TABLET | Freq: Once | ORAL | Status: AC
  Administered 2020-04-12: 1 mg via ORAL
  Filled 2020-04-12: qty 1

## 2020-04-12 NOTE — ED Provider Notes (Signed)
[facility]-EMERGENCY DEPT Provider Note   CSN: [id number] Arrival date & time: 04/12/20  1348     History Chief Complaint  Patient presents with  . Altered Mental Status  . Shortness of Breath    Jill Evans is a 30 y.o. Evans.  HPI 30 year old Evans with a history of hyperglycemia, hypothyroidism, partial 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 presents to the ER with complaints of sudden onset of shortness of breath and rib pain which started at around noon.  Patient was in the breakroom at work and started to experience the symptoms.  She denies any chest pain.  Arrived with slurred slow and slurred speech, taking small, infrequent breaths.  She states this is happened once before, when she had a panic attack.  States this feels kind of similar.  She denies any dizziness, nausea, vomiting, headache, facial droop, unilateral weakness, syncope, abdominal pain, dysuria, hematuria.  She does not take anything for anxiety.  Denies any OCPs, no recent travel, no prior history of PE    Past Medical History:  Diagnosis Date  . Hyperglycemia   . Hypothyroidism   . 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 Pregnancy induced hypertension     Patient Active Problem List   Diagnosis Date Noted  . Encounter for initial prescription of intrauterine contraceptive device (IUD) 01/02/2018  . 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 GBS (group B Streptococcus carrier), +RV culture, currently pregnant 10/27/2017  . Malpresentation before onset of labor 10/26/2017  . Rh negative state in antepartum period 10/26/2017  . Supervision of high risk pregnancy due to social problems 10/26/2017  . Obesity in pregnancy 10/26/2017  . BMI 30s 10/26/2017  . Hypothyroidism   . Severe preeclampsia, delivered 10/24/2017  . Supervision of high risk pregnancy, antepartum 10/10/2017  . Late prenatal care 10/10/2017  . Vitamin D deficiency 06/30/2015  . ADD (attention deficit disorder)  05/23/2014    Past Surgical History:  Procedure Laterality Date  . CESAREAN SECTION N/A 11/06/2017   Procedure: CESAREAN SECTION;  Surgeon: Conan Bowens, MD;  Location: [facility];  Service: Obstetrics;  Laterality: N/A;  . NO PAST SURGERIES       OB History    Gravida  1   Para  1   Term      Preterm  1   AB      Living  1     SAB      IAB      Ectopic      Multiple  0   Live Births  1           Family History  Problem Relation Age of Onset  . Deep vein thrombosis Mother   . Hypertension Mother   . Obesity Mother   . Hypertension Father   . Cancer Father   . Obesity Father     Social History   Tobacco Use  . Smoking status: Never Smoker  . Smokeless tobacco: Never Used  Vaping Use  . Vaping Use: Never used  Substance Use Topics  . Alcohol use: No  . Drug use: No    Home Medications Prior to Admission medications   Medication Sig Start Date End Date Taking? Authorizing Provider  acetaminophen (TYLENOL) 325 MG tablet Take 650 mg by mouth every 6 (six) hours as needed for mild pain.    [provider]  enalapril (VASOTEC) 20 MG tablet TAKE ONE-HALF TABLET BY MOUTH DAILY 01/08/18  Reva Bores, MD  ibuprofen (ADVIL,MOTRIN) 600 MG tablet Take 1 tablet (600 mg total) by mouth every 6 (six) hours. 11/09/17   Anyanwu, Jethro Bastos, MD  levothyroxine (SYNTHROID, LEVOTHROID) 100 MCG tablet Take 1 tablet (100 mcg total) by mouth daily before breakfast. 10/28/17   Lazaro Arms, MD  oxyCODONE (OXY IR/ROXICODONE) 5 MG immediate release tablet Take 1 tablet (5 mg total) by mouth every 4 (four) hours as needed for severe pain or breakthrough pain (pain scale 4-7). 11/09/17   Tereso Newcomer, MD    Allergies    Patient has no known allergies.  Review of Systems   Review of Systems  Constitutional: Negative for chills and fever.  HENT: Negative for ear pain and sore throat.   Eyes: Negative for pain and visual disturbance.  Respiratory:  Positive for shortness of breath. Negative for cough.   Cardiovascular: Negative for chest pain and palpitations.  Gastrointestinal: Negative for abdominal pain and vomiting.  Genitourinary: Negative for dysuria and hematuria.  Musculoskeletal: Negative for arthralgias and back pain.  Skin: Negative for color change and rash.  Neurological: Negative for seizures, syncope, weakness and headaches.  All other systems reviewed and are negative.   Physical Exam Updated Vital Signs BP 110/68   Pulse 84   Temp 98 F (36.7 C) (Oral)   Resp (!) 21   LMP 04/01/2018 Comment: IUD lmp 2 years ago  SpO2 96%   Physical Exam Vitals and nursing note reviewed.  Constitutional:      General: She is not in acute distress.    Appearance: She is well-developed.  HENT:     Head: Normocephalic and atraumatic.  Eyes:     Conjunctiva/sclera: Conjunctivae normal.  Cardiovascular:     Rate and Rhythm: Normal rate and regular rhythm.     Heart sounds: No murmur heard.   Pulmonary:     Effort: Pulmonary effort is normal. No respiratory distress.     Breath sounds: Normal breath sounds. No decreased breath sounds, wheezing, rhonchi or rales.  Chest:     Chest wall: No tenderness.  Abdominal:     Palpations: Abdomen is soft.     Tenderness: There is no abdominal tenderness.  Musculoskeletal:     Cervical back: Neck supple.     Right lower leg: No tenderness. No edema.     Left lower leg: No tenderness. No edema.  Skin:    General: Skin is warm and dry.  Neurological:     General: No focal deficit present.     Mental Status: She is alert.     Comments: Mental Status:  Alert, thought content appropriate, able to give a coherent history. Speech slow, slightly slurred, without evidence of aphasia. Able to follow 2 step commands without difficulty.  Cranial Nerves:  II: Peripheral visual fields grossly normal, pupils equal, round, reactive to light III,IV, VI: ptosis not present, extra-ocular  motions intact bilaterally  V,VII: smile symmetric, facial light touch sensation equal VIII: hearing grossly normal to voice  X: uvula elevates symmetrically  XI: bilateral shoulder shrug symmetric and strong XII: midline tongue extension without fassiculations Motor:  Normal tone. 5/5 strength of BUE and BLE major muscle groups including strong and equal grip strength and dorsiflexion/plantar flexion Sensory: light touch normal in all extremities. Cerebellar: normal finger-to-nose with bilateral upper extremities, Romberg sign absent Gait: Not assessed  Psychiatric:        Mood and Affect: Mood normal.  Behavior: Behavior normal.     ED Results / Procedures / Treatments   Labs (all labs ordered are listed, but only abnormal results are displayed) Labs Reviewed  COMPREHENSIVE METABOLIC PANEL - Abnormal; Notable for the following components:      Result Value   Potassium 3.3 (*)    Total Protein 8.7 (*)    All other components within normal limits  BLOOD GAS, VENOUS - Abnormal; Notable for the following components:   pO2, Ven <31.0 (*)    All other components within normal limits  CBC WITH DIFFERENTIAL/PLATELET  D-DIMER, QUANTITATIVE  RAPID URINE DRUG SCREEN, HOSP PERFORMED  URINALYSIS, ROUTINE W REFLEX MICROSCOPIC  I-STAT BETA HCG BLOOD, ED ([facility], [facility], [facility] ONLY)  TROPONIN I (HIGH SENSITIVITY)  TROPONIN I (HIGH SENSITIVITY)    EKG None  Radiology CT Head Wo Contrast  Result Date: 04/12/2020 CLINICAL DATA:  Altered mental status EXAM: CT HEAD WITHOUT CONTRAST TECHNIQUE: Contiguous axial images were obtained from the base of the skull through the vertex without intravenous contrast. COMPARISON:  None. FINDINGS: Brain: Ventricles and sulci are normal in size and configuration. There is no intracranial mass, hemorrhage, extra-axial fluid collection, or midline shift. Brain parenchyma appears unremarkable. No evident acute infarct. Vascular: No hyperdense vessel.  No evident  vascular calcification. Skull: Bony calvarium appears intact. Sinuses/Orbits: Visualized paranasal sinuses are clear. Orbits appear symmetric bilaterally. Other: Mastoid air cells are clear. IMPRESSION: Study within normal limits. Electronically Signed   By: Bretta Bang III M.D.   On: 04/12/2020 14:46   DG Chest Portable 1 View  Result Date: 04/12/2020 CLINICAL DATA:  Shortness of breath and altered mental status EXAM: PORTABLE CHEST 1 VIEW COMPARISON:  None. FINDINGS: Lungs are clear. Heart is upper normal in size with pulmonary vascularity normal. No adenopathy. No bone lesions. IMPRESSION: Lungs clear.  Heart upper normal in size. Electronically Signed   By: Bretta Bang III M.D.   On: 04/12/2020 14:42    Procedures Procedures   Medications Ordered in ED Medications  fentaNYL (SUBLIMAZE) injection 50 mcg (50 mcg Intravenous Given 04/12/20 1445)  LORazepam (ATIVAN) tablet 1 mg (1 mg Oral Given 04/12/20 1531)  metoCLOPramide (REGLAN) injection 5 mg (5 mg Intravenous Given 04/12/20 1538)    ED Course  I have reviewed the triage vital signs and the nursing notes.  Pertinent labs & imaging results that were available during my care of the patient were reviewed by me and considered in my medical decision making (see chart for details).    MDM Rules/Calculators/A&P                         30 year old Evans presents to the ER with right-sided rib pain and shortness of breath.  On arrival, vitals overall reassuring, no evidence of hypoxia, tachycardia, hypotension.  Patient did arrive with slow, slightly slurred speech, but has a overall reassuring neuro exam.  Following two-step commands.  She is taking infrequent, shallow breaths, but vitals overall reassuring.  DDx includes PE, panic attack, stroke, ACS, drug induced, pneumothorax, infection  I personally reviewed, and interpreted her lab work and imaging  Overall work-up very reassuring.  CMP without any significant  abnormalities, CBC without leukocytosis.  D-dimer negative.  Initial troponin of 2.  VBG with a PO2 of less than 31, however no evidence of pH changes, normal PCO2, no evidence of hypercarbia. EKG, sinus rhythm.  CT of the head without abnormalities.  Chest x-ray without evidence of pneumonia  or pneumothorax.  She was given 50 of fentanyl, Ativan, she began to have some nausea and dry heaving after receiving these medications and that she was given Reglan.  Reevaluation, patient is significant improvement in her symptoms.  Speech is no longer slurred.  She is alert, oriented, sitting up in the bed speaking with her guest in the room.  She suspects possible panic attack.  Low suspicion for PE, ACS, stroke at this time.  Did offer the patient some IV fluids, and further observation however the patient she states that she is feeling well like to go home.  I discussed return precautions, suggested that she follow-up with her PCP for possible evaluation of her panic attacks.  She is agreeable to this.  Return precautions discussed.  Stable for discharge at this time. Final Clinical Impression(s) / ED Diagnoses Final diagnoses:  Panic attack    Rx / DC Orders ED Discharge Orders    None       Leone BrandBelaya, Kirklin Mcduffee A, PA-C 04/12/20 1600    Koleen DistanceWright, Anna G, MD 04/12/20 1840

## 2020-04-12 NOTE — ED Triage Notes (Signed)
Pt presents with c/o shortness of breath and altered mental status. Pt is speaking very slowly and reports that her lungs hurt and she started having some shortness of breath around 12 noon. Pt does not appear to be in any distress but is a poor historian.

## 2020-04-12 NOTE — Discharge Instructions (Addendum)
Your work-up today was overall very reassuring.  I suspect her symptoms are due to a panic attack.  Please make sure to follow-up with your primary care doctor for evaluation of this.  If you do not have a primary care doctor, please call the phone number on your discharge paperwork in order to establish with 1.  Please return to the ER for any new or worsening symptoms.

## 2020-07-14 ENCOUNTER — Other Ambulatory Visit: Payer: Self-pay

## 2020-07-14 ENCOUNTER — Other Ambulatory Visit (HOSPITAL_COMMUNITY)
Admission: RE | Admit: 2020-07-14 | Discharge: 2020-07-14 | Disposition: A | Discharge status: Home / Self Care | Payer: BLUE CROSS/BLUE SHIELD | Source: Ambulatory Visit

## 2020-07-14 ENCOUNTER — Ambulatory Visit (INDEPENDENT_AMBULATORY_CARE_PROVIDER_SITE_OTHER): Payer: BLUE CROSS/BLUE SHIELD

## 2020-07-14 VITALS — BP 124/89 | HR 96 | Ht 59.5 in | Wt 159.3 lb

## 2020-07-14 DIAGNOSIS — R300 Dysuria: Secondary | ICD-10-CM | POA: Insufficient documentation

## 2020-07-14 DIAGNOSIS — R350 Frequency of micturition: Secondary | ICD-10-CM | POA: Diagnosis not present

## 2020-07-14 DIAGNOSIS — Z5941 Food insecurity: Secondary | ICD-10-CM | POA: Diagnosis not present

## 2020-07-14 LAB — POCT URINALYSIS DIP (DEVICE)
Bilirubin Urine: NEGATIVE
Glucose, UA: NEGATIVE mg/dL
Ketones, ur: NEGATIVE mg/dL
Nitrite: NEGATIVE
Protein, ur: 100 mg/dL — AB
Specific Gravity, Urine: >=1.03 (ref 1.005–1.030)
Urobilinogen, UA: 0.2 mg/dL (ref 0.0–1.0)
pH: 5.5 (ref 5.0–8.0)

## 2020-07-14 MED ORDER — PHENAZOPYRIDINE HCL 200 MG PO TABS: 200.0000 mg | ORAL_TABLET | Freq: Three times a day (TID) | ORAL | 0 refills | Status: DC | PRN

## 2020-07-14 MED ORDER — NITROFURANTOIN MONOHYD MACRO 100 MG PO CAPS: 100.0000 mg | ORAL_CAPSULE | Freq: Two times a day (BID) | ORAL | 0 refills | Status: DC

## 2020-07-14 NOTE — Progress Notes (Signed)
  GYNECOLOGY PROGRESS NOTE  History:  30 y.o. G1P0101 presents to [facility] office today for problem gyn visit. She reports burning/pain with urination, pressure and increased frequency for 4 days. She started having what she thinks is vaginal spotting/bleeding yesterday. She only notices the blood in the toilet and sometimes a scant amount on her pad. She is concerned that there is something wrong with her IUD as she has not had a period since it was placed in 2019. She denies severe abdominal pain, h/a, dizziness, shortness of breath, n/v, or fever/chills.    The following portions of the patient's history were reviewed and updated as appropriate: allergies, current medications, past family history, past medical history, past social history, past surgical history and problem list. Last pap smear on 10/10/2017 was NILM.  Health Maintenance Due  Topic Date Due   COVID-19 Vaccine (1) Never done   Hepatitis C Screening  Never done     Review of Systems:  Pertinent items are noted in HPI.   Objective:  Physical Exam Blood pressure 124/89, pulse 96, height 4' 11.5" (1.511 m), weight 159 lb 4.8 oz (72.3 kg). VS reviewed, nursing note reviewed,  Constitutional: well developed, well nourished, no distress HEENT: normocephalic CV: normal rate Pulm/chest wall: normal effort Breast Exam: deferred Abdomen: soft Neuro: alert and oriented x 3 Skin: warm, dry Psych: affect normal GU: Neg CVAT Pelvic exam: Cervix pink, visually closed, without lesion, scant white creamy discharge, no blood in vagina, vaginal walls and external genitalia normal, 2 IUD strings visualized, scant amount of dried blood noted externally around urethral meatus Bimanual exam: Cervix 0/long/high, firm, anterior, neg CMT, uterus nontender, nonenlarged, adnexa without tenderness, enlargement, or mass, IUD does not feel malpositioned  Assessment & Plan:   1. Food insecurity  - AMBULATORY REFERRAL TO [facility]  2.  Dysuria - Both IUD strings visualized and appear to be in correct position - No vaginal bleeding noted, Swabs obtained with patient consent - Given nature of symptoms and physical exam, will treat patient for UTI. Urine culture pending - Patient instructed to complete antibiotic course as directed and follow up as needed  - Urine Culture - Cervicovaginal ancillary only( [place]) - nitrofurantoin, macrocrystal-monohydrate, (MACROBID) 100 MG capsule; Take 1 capsule (100 mg total) by mouth 2 (two) times daily.  Dispense: 14 capsule; Refill: 0 - phenazopyridine (PYRIDIUM) 200 MG tablet; Take 1 tablet (200 mg total) by mouth 3 (three) times daily as needed for pain.  Dispense: 10 tablet; Refill: 0  3. Urinary frequency    Follow up in 3 months for well woman exam and pap smear   Brand Males, CNM 3:35 PM 4:20 PM

## 2020-07-15 LAB — CERVICOVAGINAL ANCILLARY ONLY
Bacterial Vaginitis (gardnerella): NEGATIVE
Candida Glabrata: NEGATIVE
Candida Vaginitis: NEGATIVE
Chlamydia: NEGATIVE
Comment: NEGATIVE
Comment: NEGATIVE
Comment: NEGATIVE
Comment: NEGATIVE
Comment: NEGATIVE
Comment: NORMAL
Neisseria Gonorrhea: NEGATIVE
Trichomonas: NEGATIVE

## 2020-07-19 LAB — URINE CULTURE

## 2021-07-02 ENCOUNTER — Encounter: Payer: Self-pay | Admitting: Radiology

## 2021-07-02 ENCOUNTER — Other Ambulatory Visit (HOSPITAL_COMMUNITY)
Admission: RE | Admit: 2021-07-02 | Discharge: 2021-07-02 | Disposition: A | Discharge status: Home / Self Care | Payer: Commercial Managed Care - HMO | Source: Ambulatory Visit | Attending: Radiology | Admitting: Radiology

## 2021-07-02 ENCOUNTER — Ambulatory Visit (INDEPENDENT_AMBULATORY_CARE_PROVIDER_SITE_OTHER): Payer: Commercial Managed Care - HMO | Admitting: Radiology

## 2021-07-02 VITALS — BP 110/74 | HR 97 | Ht 59.25 in | Wt 162.0 lb

## 2021-07-02 DIAGNOSIS — Z01419 Encounter for gynecological examination (general) (routine) without abnormal findings: Secondary | ICD-10-CM

## 2021-07-02 NOTE — Progress Notes (Signed)
   Jill Evans 1990/08/30 [id number]   History:  31 y.o. G1P1 presents for annual exam. Mirena IUD placed 12/2017. No gyn concerns.   Gynecologic History No LMP recorded. (Menstrual status: IUD).   Contraception/Family planning: IUD Sexually active: yes    Last Pap: 9/19. Results were: abnormal   Obstetric History OB History  Gravida Para Term Preterm AB Living  1 1   1   1   SAB IAB Ectopic Multiple Live Births        0 1    # Outcome Date GA Lbr Len/2nd Weight Sex Delivery Anes PTL Lv  1 Preterm 11/06/17 [redacted]w[redacted]d  4 lb 4.4 oz (1.94 kg) M CS-LTranv EPI  LIV     The following portions of the patient's history were reviewed and updated as appropriate: allergies, current medications, past family history, past medical history, past social history, past surgical history, and problem list.  Review of Systems Pertinent items noted in HPI and remainder of comprehensive ROS otherwise negative.   Past medical history, past surgical history, family history and social history were all reviewed and documented in the EPIC chart.   Exam:  Vitals:   07/02/21 1551  BP: 110/74  Pulse: 97  Weight: 162 lb (73.5 kg)  Height: 4' 11.25" (1.505 m)   Body mass index is 32.44 kg/m.  General appearance:  Normal Thyroid:  Symmetrical, normal in size, without palpable masses or nodularity. Respiratory  Auscultation:  Clear without wheezing or rhonchi Cardiovascular  Auscultation:  Regular rate, without rubs, murmurs or gallops  Edema/varicosities:  Not grossly evident Abdominal  Soft,nontender, without masses, guarding or rebound.  Liver/spleen:  No organomegaly noted  Hernia:  None appreciated  Skin  Inspection:  Grossly normal Breasts: Examined lying and sitting.   Right: Without masses, retractions, nipple discharge or axillary adenopathy.   Left: Without masses, retractions, nipple discharge or axillary adenopathy. Genitourinary   Inguinal/mons:  Normal without inguinal  adenopathy  External genitalia:  Normal appearing vulva with no masses, tenderness, or lesions  BUS/Urethra/Skene's glands:  Normal without masses or exudate  Vagina:  Normal appearing with normal color and discharge, no lesions  Cervix:  Normal appearing without discharge or lesions  Uterus:  Normal in size, shape and contour.  Mobile, nontender  Adnexa/parametria:     Rt: Normal in size, without masses or tenderness.   Lt: Normal in size, without masses or tenderness.  Anus and perineum: Normal   Patient informed chaperone available to be present for breast and pelvic exam. Patient has requested no chaperone to be present. Patient has been advised what will be completed during breast and pelvic exam.   Assessment/Plan:   1. Well woman exam with routine gynecological exam  - Cytology - PAP( [place])     Discussed SBE, colonoscopy and DEXA screening as directed/appropriate. Recommend 09/01/21 of exercise weekly, including weight bearing exercise. Encouraged the use of seatbelts and sunscreen. Return in 1 year for annual or as needed.   B WHNP-BC 4:17 PM 07/02/2021

## 2021-07-06 LAB — CYTOLOGY - PAP
Comment: NEGATIVE
Diagnosis: NEGATIVE
Diagnosis: REACTIVE
High risk HPV: NEGATIVE

## 2021-07-28 ENCOUNTER — Ambulatory Visit (INDEPENDENT_AMBULATORY_CARE_PROVIDER_SITE_OTHER): Payer: Commercial Managed Care - HMO | Admitting: Radiology

## 2021-07-28 VITALS — BP 116/76

## 2021-07-28 DIAGNOSIS — Z30432 Encounter for removal of intrauterine contraceptive device: Secondary | ICD-10-CM | POA: Diagnosis not present

## 2021-07-28 MED ORDER — PHEXXI 1.8-1-0.4 % VA GEL: 5.0000 g | Freq: Every day | VAGINAL | 11 refills | Status: DC

## 2021-07-28 NOTE — Progress Notes (Signed)
   Jill Evans 24-Jan-1991 [id number]   History:  31 y.o. G1P1 presents for removal of Mirena IUD.  Reason for discontinuation: would like a non hormonal method, attributes inability to lose weight to IUD. Pt has been counseled about risks and benefits as well as complications.  Consent is obtained today.  No LMP recorded. (Menstrual status: IUD).   Past medical history, past surgical history, family history and social history were all reviewed and documented in the EPIC chart.  ROS:  A ROS was performed and pertinent positives and negatives are included.  Exam: Vitals:   07/28/21 1559  BP: 116/76   There is no height or weight on file to calculate BMI.  Pelvic exam: Vulva:  normal female genitalia Vagina:  normal vagina, no discharge, exudate, lesion, or erythema Cervix:  Non-tender, Negative CMT, no lesions or redness. IUD strings visualized. Uterus:  normal shape, position and consistency    Procedure:  Speculum inserted.   Cervix visualized, IUD grasps with forceps and removed without difficulty. Pt tolerated procedure well.  Chaperone, Jill Evans, CMA was present for the entirety of the procedure   Assessment/Plan:  Removal of Mirena IUD             New contraceptive: Phexxi    Pt aware to call for any concerns, return to office as schedule for annual exam or as needed.   Arlie Solomons B WHNP-BC, 4:15 PM 07/28/2021

## 2021-07-29 ENCOUNTER — Ambulatory Visit: Payer: Commercial Managed Care - HMO | Admitting: Radiology

## 2021-08-23 NOTE — Telephone Encounter (Signed)
Office visit for UTI

## 2021-08-23 NOTE — Progress Notes (Deleted)
    Aleen Sells D.[facility] [address] [place] [address] Phone: [phone]   Assessment and Plan:     There are no diagnoses linked to this encounter.  ***   Pertinent previous records reviewed include ***   Follow Up: ***     Subjective:   I, Hisae Decoursey, am serving as a Neurosurgeon for Doctor Richardean Sale  Chief Complaint: hand injury   HPI:   08/23/21 Patient is a 31 year old female complaining of hand injury. Patient states   Relevant Historical Information: ***  Additional pertinent review of systems negative.   Current Outpatient Medications:    ibuprofen (ADVIL,MOTRIN) 600 MG tablet, Take 1 tablet (600 mg total) by mouth every 6 (six) hours., Disp: 60 tablet, Rfl: 2   Lactic Ac-Citric Ac-Pot Bitart (PHEXXI) 1.8-1-0.4 % GEL, Place 5 g vaginally daily. As needed, Disp: 120 g, Rfl: 11   Objective:     There were no vitals filed for this visit.    There is no height or weight on file to calculate BMI.    Physical Exam:    ***   Electronically signed by:  Aleen Sells D[facility] 7:56 AM 08/23/21

## 2021-08-24 ENCOUNTER — Ambulatory Visit: Payer: Commercial Managed Care - HMO | Admitting: Sports Medicine

## 2021-08-26 ENCOUNTER — Ambulatory Visit (INDEPENDENT_AMBULATORY_CARE_PROVIDER_SITE_OTHER): Payer: Commercial Managed Care - HMO | Admitting: Radiology

## 2021-08-26 ENCOUNTER — Encounter: Payer: Self-pay | Admitting: Radiology

## 2021-08-26 VITALS — BP 116/80

## 2021-08-26 DIAGNOSIS — N760 Acute vaginitis: Secondary | ICD-10-CM

## 2021-08-26 DIAGNOSIS — B3731 Acute candidiasis of vulva and vagina: Secondary | ICD-10-CM | POA: Diagnosis not present

## 2021-08-26 DIAGNOSIS — R300 Dysuria: Secondary | ICD-10-CM | POA: Diagnosis not present

## 2021-08-26 DIAGNOSIS — N3091 Cystitis, unspecified with hematuria: Secondary | ICD-10-CM | POA: Diagnosis not present

## 2021-08-26 LAB — WET PREP FOR TRICH, YEAST, CLUE

## 2021-08-26 MED ORDER — FLUCONAZOLE 150 MG PO TABS: 150.0000 mg | ORAL_TABLET | ORAL | 0 refills | Status: DC

## 2021-08-26 MED ORDER — NITROFURANTOIN MONOHYD MACRO 100 MG PO CAPS: 100.0000 mg | ORAL_CAPSULE | Freq: Two times a day (BID) | ORAL | 0 refills | Status: DC

## 2021-08-26 NOTE — Progress Notes (Signed)
      Subjective: AZKA STEGER is a 31 y.o. female who complains of dysuria, urgency and frequency x 1 week.   Review of Systems  All other systems reviewed and are negative.    Objective:  -Vulva: without lesions or discharge -Vagina: discharge present, wet prep obtained -Cervix: no lesion or discharge, no CMT -Perineum: no lesions -Uterus: Mobile, non tender -Adnexa: no masses or tenderness -Abdomen: SNT, No CVAT  Microscopic wet-mount exam shows hyphae.   Chaperone offered and declined.  Assessment:/Plan:   1. Dysuria - Urinalysis,Complete w/RFL Culture  2. Cystitis with hematuria  - nitrofurantoin, macrocrystal-monohydrate, (MACROBID) 100 MG capsule; Take 1 capsule (100 mg total) by mouth 2 (two) times daily.  Dispense: 14 capsule; Refill: 0  3. Yeast vaginitis  - fluconazole (DIFLUCAN) 150 MG tablet; Take 1 tablet (150 mg total) by mouth every 3 (three) days.  Dispense: 3 tablet; Refill: 0   Avoid intercourse until symptoms are resolved. Safe sex encouraged. Avoid the use of soaps or perfumed products in the peri area. Avoid tub baths and sitting in sweaty or wet clothing for prolonged periods of time.

## 2021-08-26 NOTE — Addendum Note (Signed)
Addended by: Windle Guard on: 08/26/2021 04:21 PM   Modules accepted: Orders

## 2021-08-28 LAB — URINALYSIS, COMPLETE W/RFL CULTURE
Bilirubin Urine: NEGATIVE
Glucose, UA: NEGATIVE
Hyaline Cast: NONE SEEN /LPF
Ketones, ur: NEGATIVE
Nitrites, Initial: NEGATIVE
Specific Gravity, Urine: 1.024 (ref 1.001–1.035)
WBC, UA: >=60 /HPF — AB (ref 0–5)
pH: 5.5 (ref 5.0–8.0)

## 2021-08-28 LAB — URINE CULTURE
MICRO NUMBER:: 13702769
SPECIMEN QUALITY:: ADEQUATE

## 2021-08-28 LAB — CULTURE INDICATED

## 2021-08-31 ENCOUNTER — Ambulatory Visit: Payer: Commercial Managed Care - HMO | Admitting: Radiology

## 2021-09-14 ENCOUNTER — Ambulatory Visit: Payer: Commercial Managed Care - HMO | Admitting: Family Medicine

## 2021-09-20 ENCOUNTER — Ambulatory Visit (INDEPENDENT_AMBULATORY_CARE_PROVIDER_SITE_OTHER): Payer: Commercial Managed Care - HMO | Admitting: Gastroenterology

## 2021-09-20 ENCOUNTER — Encounter: Payer: Self-pay | Admitting: Gastroenterology

## 2021-09-20 VITALS — BP 115/82 | HR 78 | Temp 98.5°F | Ht 59.0 in | Wt 164.0 lb

## 2021-09-20 DIAGNOSIS — K582 Mixed irritable bowel syndrome: Secondary | ICD-10-CM

## 2021-09-20 NOTE — Progress Notes (Signed)
Gastroenterology Consultation  Referring Provider:     Diamantina Providence, FNP Primary Care Physician:  Diamantina Providence, FNP Primary Gastroenterologist:  Dr. Servando Snare     Reason for Consultation:     Irritable bowel syndrome        HPI:   Jill Evans is a 31 y.o. y/o female referred for consultation & management of irritable bowel syndrome by Dr. Dareen Piano, Reggie Pile, FNP.  This patient comes in today with a report that she has irritable bowel syndrome.  The patient states that she knows she has a because she has similar symptoms to her mother.  The patient states that it started after she had her child 4 years ago.  She also reports that she has gained weight since the birth of her child.  The patient denies any unexplained weight loss fevers chills nausea vomiting black stools or bloody stools.  She does report that she gets so bloated that she feels like she cannot catch her breath.  She also reports that she has some relief with a bowel movement but not for long.  She states that she has alternating diarrhea and constipation. There is no family history of colon cancer or colon polyps.  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 Authorizing Provider  fluconazole (DIFLUCAN) 150 MG tablet Take 1 tablet (150 mg total) by mouth every 3 (three) days. 08/26/21   Chrzanowski, Jami B, NP  ibuprofen (ADVIL,MOTRIN) 600 MG tablet Take 1 tablet (600 mg total) by mouth every 6 (six) hours.  11/09/17   Anyanwu, Jethro Bastos, MD  Lactic Ac-Citric Ac-Pot Bitart (PHEXXI) 1.8-1-0.4 % GEL Place 5 g vaginally daily. As needed 07/28/21   Chrzanowski, Clearnce Hasten B, NP  levonorgestrel (MIRENA) 20 MCG/DAY IUD 1 each by Intrauterine route once. Inserted 2019    [provider]  nitrofurantoin, macrocrystal-monohydrate, (MACROBID) 100 MG capsule Take 1 capsule (100 mg total) by mouth 2 (two) times daily. 08/26/21   Chrzanowski, Lamona Curl, NP    Family History  Problem Relation Age of Onset   Deep vein thrombosis Mother    Hypertension Mother    Obesity Mother    Other Mother        Lyme's disease   Hypertension Father    Cancer Father    Obesity Father      Social History   Tobacco Use   Smoking status: Never   Smokeless tobacco: Never  Vaping Use   Vaping Use: Never used  Substance Use Topics   Alcohol use: No   Drug use: No    Allergies as of 09/20/2021   (No Known Allergies)    Review of Systems:    All systems reviewed and negative except where noted in HPI.   Physical Exam:  There were no vitals taken for this visit. No LMP recorded. (Menstrual status: IUD). General:   Alert,  Well-developed, well-nourished, pleasant and  cooperative in NAD Head:  Normocephalic and atraumatic. Eyes:  Sclera clear, no icterus.   Conjunctiva pink. Ears:  Normal auditory acuity. Neck:  Supple; no masses or thyromegaly. Lungs:  Respirations even and unlabored.  Clear throughout to auscultation.   No wheezes, crackles, or rhonchi. No acute distress. Heart:  Regular rate and rhythm; no murmurs, clicks, rubs, or gallops. Abdomen:  Normal bowel sounds.  No bruits.  Soft, non-tender and non-distended without masses, hepatosplenomegaly or hernias noted.  No guarding or rebound tenderness.  Negative Carnett sign.   Rectal:  Deferred.  Pulses:  Normal pulses noted. Extremities:  No clubbing or edema.  No cyanosis. Neurologic:  Alert and oriented x3;  grossly normal neurologically. Skin:  Intact  without significant lesions or rashes.  No jaundice. Lymph Nodes:  No significant cervical adenopathy. Psych:  Alert and cooperative. Normal mood and affect.  Imaging Studies: No results found.  Assessment and Plan:   Jill Evans is a 31 y.o. y/o female with a history of alternating diarrhea and constipation with bloating and abdominal discomfort.  The patient has been told that she has irritable bowel syndrome with alternating diarrhea and constipation.  She has also been told start Citrucel and has been given information on a low FODMAP diet.  The patient has been told that she may need to be started on desipramine if her symptoms do not improve.  The patient will contact me to let me know if her symptoms are not improving.  The patient has been explained the plan and agrees with it.    Midge Minium, MD. Clementeen Graham    Note: This dictation was prepared with Dragon dictation along with smaller phrase technology. Any transcriptional errors that result from this process are unintentional.

## 2021-09-22 ENCOUNTER — Ambulatory Visit (INDEPENDENT_AMBULATORY_CARE_PROVIDER_SITE_OTHER): Payer: Commercial Managed Care - HMO | Admitting: Family

## 2021-09-22 ENCOUNTER — Encounter: Payer: Self-pay | Admitting: Family

## 2021-09-22 VITALS — BP 110/76 | HR 67 | Temp 97.9°F | Ht 59.0 in | Wt 163.8 lb

## 2021-09-22 DIAGNOSIS — O0930 Supervision of pregnancy with insufficient antenatal care, unspecified trimester: Secondary | ICD-10-CM

## 2021-09-22 DIAGNOSIS — R635 Abnormal weight gain: Secondary | ICD-10-CM

## 2021-09-22 DIAGNOSIS — Z7689 Persons encountering health services in other specified circumstances: Secondary | ICD-10-CM

## 2021-09-22 HISTORY — DX: Abnormal weight gain: R63.5

## 2021-09-22 NOTE — Patient Instructions (Addendum)
Welcome to [facility], It was a pleasure meeting you today!   I have sent a referral to our Weight management clinic, they will call you directly to schedule.  Please schedule a physical with fasting labs at your convenience.  Start working on what we discussed today regarding your weight loss goals:  1) Eat small portions, ok to eat 6 mini meals vs. 3 big meals if this controls your hunger  better. 2) Eat until full and then STOP! This is your body telling you it has had enough. 3) Drink at least 64 oz water = 2 liters = 8, 8oz cups DAILY. 4) Eat most of your calories earlier in the day (if you work during the day).  Want to eat within a 8-10hour window if possible. No eating after 7pm, only no calorie drinks or water from 7p until bedtime. 5) Look for low carb, high protein foods/recipes. Avoid simple carbs including sweets, white bread, rice, potatoes, pasta. Look for whole grain/whole wheat/or almond flour if eating these foods. 6) High protein foods: meats (limit red meat), including [place], chicken, pork, FISH (best source) nuts, cheese, beans (black beans, soy/edamame beans are best). Protein drinks, bars are also good but must have low sugar, look for < 10 grams. 7) Avoid processed/refined sugar and artificial sweeteners if possible. Stevia, Erythritol, or Monk fruit sweetener are preferred, natural, no calorie sweeteners.  Natural sweeteners like Agave or honey in very small amounts are ok also.     PLEASE NOTE: If you had any LAB tests please let us know if you have not heard back within a few days. You may see your results on MyChart before we have a chance to review them but we will give you a call once they are reviewed by [place]. If we  ordered any REFERRALS today, please let us know if you have not heard from their office within the next week.  Let us know through MyChart if you are needing REFILLS, or have your pharmacy send [place] the request. You can  also use MyChart to communicate with me or any office staff.

## 2021-09-22 NOTE — Progress Notes (Signed)
New Patient Office Visit  Subjective:  Patient ID: Jill Evans, female    DOB: 1990/03/28  Age: 31 y.o. MRN: [id number]  CC:  Chief Complaint  Patient presents with   Establish Care   Obesity    HPI Jill Evans presents for establishing care today.  Obesity: reports inability to lose weight since having her son 4 years ago. Reports having little appetite, eats carbs for lunch, goldfish, sugar cereal,  works in produce in grocery store. Has not previously tried commercial wt loss programs or medications. Does not currently exercise. Reports hx of eating disorder, states her parents, siblings, aunts/uncles are all overweight or obese.  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 Loss strategies reviewed including portion control, less carbs including sweets, eating most of calories earlier in day, drinking 64oz water qd, and establishing daily exercise routine.      Relevant Orders   Amb Ref to Medical Weight Management    Subjective:    No outpatient medications prior to visit.   No facility-administered medications prior to visit.   Past Medical History:  Diagnosis Date   Anxiety    Encounter for initial prescription of intrauterine contraceptive device (IUD) 01/02/2018   GBS (group B Streptococcus carrier), +RV culture, currently pregnant 10/27/2017   Hyperglycemia    Hypothyroidism    patient states she was diagnosed in error   IBS (irritable bowel syndrome)    Malpresentation before onset of labor 10/26/2017   9/24: breech   Partial deletion of  chromosome 13    Pregnancy induced hypertension    Rh negative state in antepartum period 10/26/2017   [x]  rhogam 9/24   Severe preeclampsia, delivered 10/24/2017     Guidelines for Antenatal Testing and Sonography  (with updated ICD-10 codes)  Updated  2016/10/24 INDICATION U/S NST/AFI or BPP wkly DELIVERY       CHTN - O10.919  Group I   BP < 140/90, no preeclampsia, AGA,  nml AFV, +/- meds    Group II   BP > 140/90, on meds, no preeclampsia, AGA, nml AFV  20-28-34-38  20-24-28-32-35-38  32//2 x wk  28//BPP wkly then 32//2 x wk or BPP wkly  40 no meds; 39 med   Supervision of high risk pregnancy due to social problems 10/26/2017   Wanting to give up for adoption (10/28/2017)   Supervision of high risk pregnancy, antepartum 10/10/2017    Nursing Staff Provider  Office Location  [place] Dating   third trimester ultrasound  Language   English Anatomy 12/10/2017    Flu Vaccine  10/10/17 Genetic Screen  NIPS:   AFP:   First Screen:  Quad:    TDaP vaccine   10/10/17 Hgb A1C or  GTT Early  Third trimester ha1c 5.1  Rhogam   10-25-17   LAB RESULTS   Feeding Plan  Blood Type --/--/B NEG (09/28 1322) B negative  Contraception  Antibody POS (09/28 132   Past Surgical History:  Procedure Laterality Date  CESAREAN SECTION N/A 11/06/2017   Procedure: CESAREAN SECTION;  Surgeon: Conan Bowens, MD;  Location: [facility] BIRTHING SUITES;  Service: Obstetrics;  Laterality: N/A;   mirena     inserted 2019    Objective:   Today's Vitals: BP 110/76 (BP Location: Left Arm, Patient Position: Sitting, Cuff Size: Large)   Pulse 67   Temp 97.9 F (36.6 C) (Temporal)   Ht 4\' 11"  (1.499 m)   Wt 163 lb 12.8 oz (74.3 kg)   LMP 08/04/2021 (Approximate)   SpO2 100%   BMI 33.08 kg/m   Physical Exam Vitals and nursing note reviewed.  Constitutional:      Appearance: Normal appearance. She is obese.  Cardiovascular:     Rate and Rhythm: Normal rate and regular rhythm.  Pulmonary:     Effort: Pulmonary effort is normal.      Breath sounds: Normal breath sounds.  Musculoskeletal:        General: Normal range of motion.  Skin:    General: Skin is warm and dry.  Neurological:     Mental Status: She is alert.  Psychiatric:        Mood and Affect: Mood normal.        Behavior: Behavior normal.     10/05/2021, NP

## 2021-09-24 ENCOUNTER — Encounter: Payer: Self-pay | Admitting: Family

## 2021-09-24 NOTE — Assessment & Plan Note (Signed)
   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 Loss strategies reviewed including portion control, less carbs including sweets, eating most of calories earlier in day, drinking 64oz water qd, and establishing daily exercise routine.

## 2021-10-25 ENCOUNTER — Encounter: Payer: Self-pay | Admitting: *Deleted

## 2021-11-10 ENCOUNTER — Other Ambulatory Visit: Payer: Self-pay

## 2021-11-10 ENCOUNTER — Encounter: Payer: Self-pay | Admitting: Family

## 2021-11-10 ENCOUNTER — Ambulatory Visit (INDEPENDENT_AMBULATORY_CARE_PROVIDER_SITE_OTHER): Payer: Commercial Managed Care - HMO | Admitting: Family

## 2021-11-10 ENCOUNTER — Emergency Department (HOSPITAL_BASED_OUTPATIENT_CLINIC_OR_DEPARTMENT_OTHER)
Admission: EM | Admit: 2021-11-10 | Discharge: 2021-11-10 | Discharge status: Against Medical Advice | Payer: Commercial Managed Care - HMO | Attending: Emergency Medicine | Admitting: Emergency Medicine

## 2021-11-10 ENCOUNTER — Encounter (HOSPITAL_BASED_OUTPATIENT_CLINIC_OR_DEPARTMENT_OTHER): Payer: Self-pay

## 2021-11-10 VITALS — BP 112/80 | HR 89 | Temp 97.8°F | Ht 59.0 in | Wt 161.8 lb

## 2021-11-10 DIAGNOSIS — M25511 Pain in right shoulder: Secondary | ICD-10-CM | POA: Diagnosis not present

## 2021-11-10 DIAGNOSIS — M79601 Pain in right arm: Secondary | ICD-10-CM | POA: Diagnosis present

## 2021-11-10 DIAGNOSIS — Z5321 Procedure and treatment not carried out due to patient leaving prior to being seen by health care provider: Secondary | ICD-10-CM | POA: Insufficient documentation

## 2021-11-10 MED ORDER — PREDNISONE 10 MG PO TABS: ORAL_TABLET | ORAL | 0 refills | Status: DC

## 2021-11-10 MED ORDER — CYCLOBENZAPRINE HCL 5 MG PO TABS: 5.0000 mg | ORAL_TABLET | Freq: Three times a day (TID) | ORAL | 1 refills | Status: DC | PRN

## 2021-11-10 NOTE — Progress Notes (Signed)
Patient ID: Jill Evans, female    DOB: 1990-08-14, 31 y.o.   MRN: [id number]  Chief Complaint  Patient presents with   Arm Pain    Pt c/o right shoulder/ arm and neck down to hand nerve pain. Present for about a week. Soaking bath, ibuprofen 200 mg , 3 in the morning which does not help at all. Pain is persistent. No numbness.     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 Reports shooting pain from neck, shoulder down to hand, tingling in palm of hand, denies numbness.   Assessment & Plan:  1. Acute pain of right shoulder sending prednisone pack and Flexeril, advised on use & SE. Should not work & no heavy lifting or other activities involving the shoulder for at least a week. OK to apply ice or heat for up to 69min tid, Start gentle ROM exercises as pain eases off.   - predniSONE (DELTASONE) 10 MG tablet; Take every morning. 6 tab day 1, 5 tab day 2, 4 tab day 3-4, 3 tab day 5, 2 tab day 6  Dispense: 26 tablet; Refill: 0 - cyclobenzaprine (FLEXERIL) 5 MG tablet; Take 1 tablet (5 mg total) by mouth 3 (three) times daily as needed for muscle spasms. OK to take 2 pills qhs to help with sleep.  Dispense: 30 tablet; Refill: 1  Subjective:    Outpatient Medications Prior to Visit  Medication Sig Dispense Refill   ibuprofen (ADVIL) 200 MG tablet Take 200 mg by mouth every 6 (six) hours as needed for moderate pain.     No facility-administered medications prior to visit.   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     Pregnancy induced hypertension    Rh negative state in antepartum period 10/26/2017   [x]  rhogam 9/24   Severe preeclampsia, delivered 10/24/2017     Guidelines for Antenatal Testing and Sonography  (with updated ICD-10 codes)  Updated  10/24/16 INDICATION U/S NST/AFI or BPP wkly DELIVERY       CHTN - O10.919  Group I   BP < 140/90, no preeclampsia, AGA,  nml AFV, +/- meds    Group II   BP > 140/90, on meds, no preeclampsia, AGA, nml AFV  20-28-34-38  20-24-28-32-35-38  32//2 x wk  28//BPP wkly then 32//2 x wk or BPP wkly  40 no meds; 21 med   Supervision of high risk pregnancy due to social problems 10/26/2017   Wanting to give up for adoption ([employer])   Supervision of high risk pregnancy, antepartum 10/10/2017    Nursing Staff Provider  Office Location  [facility] Dating   third trimester ultrasound  Language   English Anatomy US    Flu Vaccine  10/10/17 Genetic Screen  NIPS:   AFP:   First Screen:  Quad:    TDaP vaccine   10/10/17 Hgb A1C or  GTT Early  Third trimester ha1c 5.1  Rhogam   10-25-17   LAB  RESULTS   Feeding Plan  Blood Type --/--/B NEG (09/28 1322) B negative  Contraception  Antibody POS (09/28 132   Past Surgical History:  Procedure Laterality Date   CESAREAN SECTION N/A 11/06/2017   Procedure: CESAREAN SECTION;  Surgeon: Conan Bowens, MD;  Location: [facility];  Service: Obstetrics;  Laterality: N/A;   mirena     inserted 2019   No Known Allergies    Objective:    Physical Exam Vitals and nursing note reviewed.  Constitutional:      Appearance: Normal appearance.  Cardiovascular:     Rate and Rhythm: Normal rate and regular rhythm.  Pulmonary:     Effort: Pulmonary effort is normal.     Breath sounds: Normal breath sounds.  Musculoskeletal:     Right shoulder: Tenderness present. No swelling. Decreased range of motion. Decreased strength.  Skin:    General: Skin is warm and dry.  Neurological:     Mental Status: She is alert.  Psychiatric:         Mood and Affect: Mood normal.        Behavior: Behavior normal.    BP 112/80 (BP Location: Left Arm, Patient Position: Sitting, Cuff Size: Large)   Pulse 89   Temp 97.8 F (36.6 C) (Temporal)   Ht 4\' 11"  (1.499 m)   Wt 161 lb 12.8 oz (73.4 kg)   LMP 10/08/2021 (Exact Date)   SpO2 99%   BMI 32.68 kg/m  Wt Readings from Last 3 Encounters:  11/10/21 161 lb 12.8 oz (73.4 kg)  09/22/21 163 lb 12.8 oz (74.3 kg)  09/20/21 164 lb (74.4 kg)       09/22/21, NP

## 2021-11-10 NOTE — Patient Instructions (Addendum)
It was very nice to see you today!    Start the prednisone pack tomorrow morning. Tonight ok to take up to 800mg  of Ibuprofen (TONIGHT ONLY) after eating with the generic Flexeril (sent to your pharmacy) and you can take 2 of the Flexeril to help with sleep.  OK to apply ice for up to 30 minutes several times per day, and/or alternate with heat for same amount of time, whichever gives you relief in pain.  Avoid using your shoulder or arm for the next week, and will need to have limited activity when back to work - no heavy lifting more than 5 lbs for another week.  Let me know if above treatment is still not improving your symptoms.      PLEASE NOTE:  If you had any lab tests please let us know if you have not heard back within a few days. You may see your results on MyChart before we have a chance to review them but we will give you a call once they are reviewed by [place]. If we ordered any referrals today, please let us know if you have not heard from their office within the next week.

## 2021-11-10 NOTE — ED Triage Notes (Signed)
Pt presents to the ED with right arm pain x1 week. Denies injury or trauma. Reports full sensation. Pt A&Ox4 at time of triage. VSS. Pt states that her partner drove her here.

## 2021-11-15 ENCOUNTER — Encounter: Payer: Self-pay | Admitting: Family

## 2021-11-15 DIAGNOSIS — M25511 Pain in right shoulder: Secondary | ICD-10-CM

## 2021-11-16 MED ORDER — CYCLOBENZAPRINE HCL 10 MG PO TABS: 10.0000 mg | ORAL_TABLET | Freq: Three times a day (TID) | ORAL | 0 refills | Status: DC | PRN

## 2021-11-16 NOTE — Telephone Encounter (Signed)
Patient called for an update on message sent. I offered pt an OV just in case and she accepted. She has been scheduled for 10/18 @ 2:40pm. States she would still like a reply as soon as possible.

## 2021-11-16 NOTE — Telephone Encounter (Signed)
Pt states she has not been taking the Flexeril as prescribed, she has on been taking 2 pills daily. Pt states medication is only a muscle relaxer and it does not work and would like a referral to Ortho. Pt states prednisone also did not help. She has been taking ibuprofen over 800 mg daily, 3 pills in the morning and 3 in the afternoon and 2 at night if needed, Let pt know that Per Colletta [place] this could damage her Kidneys and she should not take this many pills. Pt was advised Per Colletta [place] to take ibuprofen  ONLY 3x a day(600 mg) and Flexeril 10 mg 3x a day, which has been sent into her pharmacy. Referral for ortho sent. Pt gave a verbalized understanding.

## 2021-11-17 ENCOUNTER — Encounter: Payer: Self-pay | Admitting: Family

## 2021-11-17 ENCOUNTER — Ambulatory Visit: Payer: Self-pay

## 2021-11-17 ENCOUNTER — Ambulatory Visit: Payer: Commercial Managed Care - HMO

## 2021-11-17 ENCOUNTER — Ambulatory Visit (INDEPENDENT_AMBULATORY_CARE_PROVIDER_SITE_OTHER): Payer: Commercial Managed Care - HMO | Admitting: Surgery

## 2021-11-17 ENCOUNTER — Encounter: Payer: Self-pay | Admitting: Surgery

## 2021-11-17 VITALS — BP 116/75 | HR 100 | Ht 59.0 in | Wt 161.0 lb

## 2021-11-17 DIAGNOSIS — M5412 Radiculopathy, cervical region: Secondary | ICD-10-CM | POA: Diagnosis not present

## 2021-11-17 DIAGNOSIS — M7541 Impingement syndrome of right shoulder: Secondary | ICD-10-CM

## 2021-11-17 DIAGNOSIS — M542 Cervicalgia: Secondary | ICD-10-CM | POA: Diagnosis not present

## 2021-11-17 DIAGNOSIS — M25511 Pain in right shoulder: Secondary | ICD-10-CM | POA: Diagnosis not present

## 2021-11-17 MED ORDER — BUPIVACAINE HCL 0.25 % IJ SOLN
5.0000 mL | INTRAMUSCULAR | Status: AC | PRN
  Administered 2021-11-17: 5 mL via INTRA_ARTICULAR

## 2021-11-17 MED ORDER — LIDOCAINE HCL 1 % IJ SOLN
3.0000 mL | INTRAMUSCULAR | Status: AC | PRN
  Administered 2021-11-17: 3 mL

## 2021-11-17 MED ORDER — METHYLPREDNISOLONE ACETATE 40 MG/ML IJ SUSP
40.0000 mg | INTRAMUSCULAR | Status: AC | PRN
  Administered 2021-11-17: 40 mg via INTRA_ARTICULAR

## 2021-11-17 NOTE — Progress Notes (Signed)
Pt is here to discuss a letter for work for further time out. Letter typed and printed for pt.

## 2021-11-17 NOTE — Progress Notes (Signed)
Office Visit Note   Patient: Jill Evans           Date of Birth: 1990-04-28           MRN: [id number] Visit Date: 11/17/2021              Requested by: Jeanie Sewer, NP [place],  [place] [id number] PCP: Jeanie Sewer, NP   Assessment & Plan: Visit Diagnoses:  1. Acute pain of right shoulder   2. Neck pain   3. Radiculopathy, cervical region   4. Impingement syndrome of right shoulder     Plan: In hopes given patient relief of her shoulder pain offered conservative treatment with injection.  After patient consent right shoulder was prepped with Betadine and subacromial Marcaine/Depo-Medrol injection was performed.  Tolerated without complication.  After sitting for few minutes patient did report good relief of her pain and range of motion also improved.  I will send patient to formal PT for a few weeks.  Follow-up with Dr. Lorin Mercy in 3 weeks for recheck.  He can decide at that time if further imaging of her cervical spine or shoulder is needed.  Follow-Up Instructions: Return in about 3 weeks (around 12/08/2021) for WITH DR [place] RECHECK NECK AND RIGHT SHOULDER.   Orders:  Orders Placed This Encounter  Procedures   Large Joint Inj   XR Cervical Spine 2 or 3 views   XR Shoulder Right   Ambulatory referral to Physical Therapy   No orders of the defined types were placed in this encounter.     Procedures: Large Joint Inj: R subacromial bursa on 11/17/2021 11:35 AM Indications: pain Details: 25 G 1.5 in needle, posterior approach Medications: 3 mL lidocaine 1 %; 5 mL bupivacaine 0.25 %; 40 mg methylPREDNISolone acetate 40 MG/ML Consent was given by the patient. Patient was prepped and draped in the usual sterile fashion.      Clinical Data: No additional findings.   Subjective: Chief Complaint  Patient presents with   Neck - Pain   Right Shoulder - Pain    HPI 31 year old white female who is new patient to clinic comes in today  with complaints of neck pain, right shoulder pain and intermittent right arm pain.  Patient has had problems been ongoing for a few weeks.  Denies injury.  She is having some pain in the neck that goes down into her right shoulder blade and into the right arm.  She has had problems with right shoulder overactivity and reaching down her back.  Pain when she lays directly on the right shoulder.  She is right-hand dominant.  Intermittent numbness and tingling in the right hand.  She was seen by primary care provider November 10, 2021 and was prescribed prednisone pack and Flexeril.  States that she really has not had any improvement. Review of Systems No current complaints of cardiopulmonary GI/GU issues  Objective: Vital Signs: BP 116/75   Pulse 100   Ht 4\' 11"  (1.499 m)   Wt 161 lb (73 kg)   LMP 10/08/2021   BMI 32.52 kg/m   Physical Exam HENT:     Head: Normocephalic and atraumatic.  Eyes:     Extraocular Movements: Extraocular movements intact.  Musculoskeletal:     Comments: Cervical spine good range of motion but with some soreness on the right.  No brachial plexus or trapezius tenderness.  Some pain along the right medial scapular border.  Right shoulder flexion/abduction to about 140-150  degrees with discomfort.  Limited internal rotation behind her back due to pain.  Negative drop arm test.  Markedly positive impingement test.  Pain with supraspinatus resistance.  Proximal biceps tendon tender.  No palpable tendon defect.  Left shoulder unremarkable.  No focal motor deficits.  Neurological:     Mental Status: She is alert and oriented to person, place, and time.  Psychiatric:        Mood and Affect: Mood normal.     Ortho Exam  Specialty Comments:  No specialty comments available.  Imaging: No results found.   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   BMI 30s  10/26/2017   Hypothyroidism    Late prenatal care 10/10/2017   Vitamin D deficiency 06/30/2015   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    Pregnancy induced hypertension    Rh negative state in antepartum period 10/26/2017   [x]  rhogam 9/24   Severe preeclampsia, delivered 10/24/2017     Guidelines for Antenatal Testing and Sonography  (with updated ICD-10 codes)  Updated  11/11/16 INDICATION U/S NST/AFI or BPP wkly DELIVERY       CHTN - O10.919  Group I   BP < 140/90, no preeclampsia, AGA,  nml AFV, +/- meds    Group II   BP > 140/90, on meds, no preeclampsia, AGA, nml AFV  20-28-34-38  20-24-28-32-35-38  32//2 x wk  28//BPP wkly then 32//2 x wk or BPP wkly  40 no meds; 74 med   Supervision of high risk pregnancy due to social problems 10/26/2017   Wanting to give up for adoption ([employer])   Supervision of high risk pregnancy, antepartum 10/10/2017    Nursing Staff Provider  Office Location  [facility] Dating   third trimester ultrasound  Language   English Anatomy US    Flu Vaccine  10/10/17 Genetic Screen  NIPS:   AFP:   First Screen:  Quad:    TDaP vaccine   10/10/17 Hgb A1C or  GTT Early  Third trimester ha1c 5.1  Rhogam   10-25-17   LAB RESULTS   Feeding Plan  Blood Type --/--/B NEG (09/28 1322) B negative  Contraception  Antibody POS (09/28 132    Family History  Problem Relation Age of Onset   Deep vein thrombosis Mother    Hypertension Mother    Obesity Mother    Other Mother        Lyme's disease   Depression Mother    Diabetes Mother    Varicose Veins Mother    Hypertension Father     Cancer Father    Obesity Father    Cancer Maternal Grandfather    Cancer Paternal Grandfather     Past Surgical History:  Procedure Laterality Date   CESAREAN SECTION N/A 11/06/2017   Procedure: CESAREAN SECTION;  Surgeon: Sloan Leiter, MD;  Location: [facility];  Service: Obstetrics;  Laterality: N/A;   mirena     inserted 2019  Social History   Occupational History   Not on file  Tobacco Use   Smoking status: Never   Smokeless tobacco: Never  Vaping Use   Vaping Use: Never used  Substance and Sexual Activity   Alcohol use: No   Drug use: No   Sexual activity: Yes    Partners: Male    Birth control/protection: None    Comment: Just took iud out after 4 years

## 2021-11-29 ENCOUNTER — Ambulatory Visit: Payer: Commercial Managed Care - HMO | Admitting: Physical Therapy

## 2021-12-01 ENCOUNTER — Ambulatory Visit: Payer: Commercial Managed Care - HMO | Admitting: Physical Therapy

## 2021-12-01 ENCOUNTER — Encounter: Payer: Self-pay | Admitting: Physical Therapy

## 2021-12-01 DIAGNOSIS — R293 Abnormal posture: Secondary | ICD-10-CM | POA: Diagnosis not present

## 2021-12-01 DIAGNOSIS — M542 Cervicalgia: Secondary | ICD-10-CM

## 2021-12-01 DIAGNOSIS — M6281 Muscle weakness (generalized): Secondary | ICD-10-CM

## 2021-12-01 DIAGNOSIS — R29898 Other symptoms and signs involving the musculoskeletal system: Secondary | ICD-10-CM

## 2021-12-01 DIAGNOSIS — M79601 Pain in right arm: Secondary | ICD-10-CM

## 2021-12-01 DIAGNOSIS — R29818 Other symptoms and signs involving the nervous system: Secondary | ICD-10-CM

## 2021-12-01 NOTE — Therapy (Signed)
OUTPATIENT PHYSICAL THERAPY SHOULDER EVALUATION   Patient Name: Jill Evans MRN: [id number] DOB:1990/09/22, 31 y.o., female Today's Date: 12/01/2021   PT End of Session - 12/01/21 1545     Visit Number 1    Number of Visits 17    Date for PT Re-Evaluation 01/26/22    Authorization Type [employer]    Authorization Time Period 12/01/21 to 01/26/22    Authorization - Number of Visits 30    PT Start Time 1433    PT Stop Time 1514    PT Time Calculation (min) 41 min    Activity Tolerance Patient tolerated treatment well    Behavior During Therapy [facility] for tasks assessed/performed             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    Pregnancy induced hypertension    Rh negative state in antepartum period 10/26/2017   [x]  rhogam 9/24   Severe preeclampsia, delivered 10/24/2017     Guidelines for Antenatal Testing and Sonography  (with updated ICD-10 codes)  Updated  2016-10-27 INDICATION U/S NST/AFI or BPP wkly DELIVERY       CHTN - O10.919  Group I   BP < 140/90, no preeclampsia, AGA,  nml AFV, +/- meds    Group II   BP > 140/90, on meds, no preeclampsia, AGA, nml AFV  20-28-34-38  20-24-28-32-35-38  32//2 x wk  28//BPP wkly then 32//2 x wk or BPP wkly  40 no meds; 39 med   Supervision of high risk pregnancy due to social problems 10/26/2017   Wanting to give up for adoption (10/28/2017)   Supervision of high risk pregnancy, antepartum 10/10/2017    Nursing Staff Provider  Office Location  [place] Dating   third trimester ultrasound  Language   English Anatomy 12/10/2017    Flu Vaccine  10/10/17  Genetic Screen  NIPS:   AFP:   First Screen:  Quad:    TDaP vaccine   10/10/17 Hgb A1C or  GTT Early  Third trimester ha1c 5.1  Rhogam   10-25-17   LAB RESULTS   Feeding Plan  Blood Type --/--/B NEG (09/28 1322) B negative  Contraception  Antibody POS (09/28 132   Past Surgical History:  Procedure Laterality Date   CESAREAN SECTION N/A 11/06/2017   Procedure: CESAREAN SECTION;  Surgeon: 01/06/2018, MD;  Location: [facility] BIRTHING SUITES;  Service: Obstetrics;  Laterality: N/A;   mirena     inserted 2019   Patient Active Problem List   Diagnosis Date Noted   Abnormal weight gain 09/22/2021   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   BMI 30s 10/26/2017   Hypothyroidism    Late prenatal care 10/10/2017   Vitamin D deficiency 06/30/2015    PCP: 07/02/2015   REFERRING PROVIDER: Dulce Sellar   REFERRING DIAG: M25.511 (ICD-10-CM) - Acute pain of right shoulder M54.2 (ICD-10-CM) - Neck pain   THERAPY DIAG:  Cervicalgia - Plan: PT plan of care cert/re-cert  Pain in right arm - Plan: PT plan of care cert/re-cert  Muscle weakness (generalized) -  Plan: PT plan of care cert/re-cert  Abnormal posture - Plan: PT plan of care cert/re-cert  Other symptoms and signs involving the nervous system - Plan: PT plan of care cert/re-cert  Other symptoms and signs involving the musculoskeletal system - Plan: PT plan of care cert/re-cert  Rationale for Evaluation and Treatment: Rehabilitation  ONSET DATE: 12/01/21  SUBJECTIVE:                                                                                                                                                                                      SUBJECTIVE STATEMENT: This has been going on for about a month or so, 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 I'm right handed. I'm used to getting kinks in my neck, this has happened since I was a kid, I've been  told my scoliosis might be contributing. Its not gotten any better since it started, now I'm shaking but I wonder if that is due to how I'm sleeping or possible nerve pain or if its the muscle being relaxed and the nerve being tight. Shaking goes up from up to my elbow then up to my shoulder blade, if my pain is high it will get consistently worse and its like clockwork. The shaking started about 2 weeks ago and hasn't changed, I have to raise my arm above my head and I can't lay on my arm, I can't lay on my shoulder because its very painful and if I lay on my other side it feels like my shoulder blades are grinding. My partner did bring up the fact that its hard for me to grasp things, writing is hard because my grasp isn't hard enough to write or cut long. I just sent in my paperwork for FMLA. Just having numbness in the palm of my hand not even my fingertips, no numbness in forearm or other nerve patterns. My shoulder just slumps when I let it hang, it makes my shoulder and everything hurt more. I did break my right arm going into 2nd grade, broke it in 3 places and had muscle damage with less strength in that hand but symptoms are much more than my baseline.   PERTINENT HISTORY: HPI 31 year old white female who is new patient to clinic comes in today with complaints of neck pain, right shoulder pain and intermittent right arm pain.  Patient has had problems been ongoing for a few weeks.  Denies injury.  She is having some pain in the neck that goes down into her right shoulder blade and into the right arm.  She has had  problems with right shoulder overactivity and reaching down her back.  Pain when she lays directly on the right shoulder.  She is right-hand dominant.  Intermittent numbness and tingling in the right hand.  She was seen by primary care provider November 10, 2021 and was prescribed prednisone pack and Flexeril.  States that she really has not had any improvement. Review of Systems No current  complaints of cardiopulmonary GI/GU issues   Objective: Vital Signs: BP 116/75   Pulse 100   Ht 4\' 11"  (1.499 m)   Wt 161 lb (73 kg)   LMP 10/08/2021   BMI 32.52 kg/m    Physical Exam HENT:     Head: Normocephalic and atraumatic.  Eyes:     Extraocular Movements: Extraocular movements intact.  Musculoskeletal:     Comments: Cervical spine good range of motion but with some soreness on the right.  No brachial plexus or trapezius tenderness.  Some pain along the right medial scapular border.  Right shoulder flexion/abduction to about 140-150 degrees with discomfort.  Limited internal rotation behind her back due to pain.  Negative drop arm test.  Markedly positive impingement test.  Pain with supraspinatus resistance.  Proximal biceps tendon tender.  No palpable tendon defect.  Left shoulder unremarkable.  No focal motor deficits.  Neurological:   PAIN:  Are you having pain? Yes: NPRS scale: 7/10 Pain location: just above R elbow and down to mid-forearm R  Pain description: throbbing Aggravating factors: using arm, sleeping, cooking, cleaning  Relieving factors: putting arm above my head gives momentary relief   PRECAUTIONS: None  WEIGHT BEARING RESTRICTIONS: No  FALLS:  Has patient fallen in last 6 months? No  LIVING ENVIRONMENT: Lives with: lives with their family Lives in: House/apartment Stairs:  2 flights, U rail  Has following equipment at home: None  OCCUPATION: Cuts fruit in retail   PLOF: Independent and Independent with basic ADLs  PATIENT GOALS:get back to work   OBJECTIVE:   DIAGNOSTIC FINDINGS:    PATIENT SURVEYS:  FOTO 51  COGNITION: Overall cognitive status: Within functional limits for tasks assessed     SENSATION: Not tested today due to time limits of session, does report numbness in palm of R hand only  POSTURE: Scoliotic curve noted, forward head, flexed at hips   UPPER EXTREMITY ROM:   Active ROM Right eval Left eval  Shoulder  flexion 163 WNL  Shoulder extension    Shoulder abduction 138 171  Shoulder adduction    Shoulder internal rotation FIR to mid R glute  FIR L1   Shoulder external rotation FER occiput  FER C4   Elbow flexion 125 with surging, throbbing pain   Elbow extension 4   Wrist flexion    Wrist extension    Wrist ulnar deviation    Wrist radial deviation    Wrist pronation    Wrist supination    (Blank rows = not tested)  Cervical flexion 32* with squeezing pain deltoid to elbow, cervical extension 5 degrees with "kinking" in neck, cervical lateral flexion R 25* L 22* with R sided neck pain, cervical rotation severe limitation B   Thoracic flexion/extension WNL but with R rotator cuff pain, thoracic lateral flexion L severe limitation R moderate limitation, thoracic rotation mild limitation B   UPPER EXTREMITY MMT:  MMT Right eval Left eval  Shoulder flexion 4- 4+  Shoulder extension 4 4  Shoulder abduction 4 4  Shoulder adduction    Shoulder internal rotation 4 4+  Shoulder external rotation 4 4+  Middle trapezius    Lower trapezius    Elbow flexion    Elbow extension    Wrist flexion    Wrist extension    Wrist ulnar deviation    Wrist radial deviation    Wrist pronation    Wrist supination    Grip strength (lbs)    (Blank rows = not tested)  Unable to perform finger opposition due to hand shaking   SHOULDER SPECIAL TESTS:  Spurlings test (+) B Some relief noted with gentle cervical traction sitting but unable to tolerate in sitting due to shoulder pain with traction- may need to try this supine  Carpal tunnel syndrome testing + and caused more shaking of hand  Tinels test caused increased shaking in hand/fingers Of note stretching wrist flexors causes increased pain and hand shaking   JOINT MOBILITY TESTING:    PALPATION:  Severe mm spasms noted in thoracic and cervical paraspinals,upper trap R not too bad, very TTP and guarded RUE as a whole, R elbow mm very tight  and sore- suspecting some probable lateral tendonitis at R elbow joint   TODAY'S TREATMENT:                                                                                                                                         DATE:   12/01/21- eval only    PATIENT EDUCATION: Education details: exam findings, POC and differential dx, benefits of possible MRI and NCV- PT will reach out to referring, possible ongoing further inflammation of pain/symptoms with return to work before completing therapy  Person educated: Patient Education method: Explanation Education comprehension: verbalized understanding  HOME EXERCISE PROGRAM: TBD   ASSESSMENT:  CLINICAL IMPRESSION: Patient is a 30 y.o. F who was seen today for physical therapy evaluation and treatment for R shoulder and neck pain. Of note, evaluation was highly complex- per findings, I suspect possible cervical impingement which is contributing to neck and shoulder pain, especially as she reports history of "kinking and catching" feelings in her neck for the long term in addition to abnormal posture secondary to scoliosis; also, able to briefly relieve neck pain with gentle seated cervical traction manually however unable to tolerate this for a long period of time. May be able to tolerate this better supine, will try next session. Pain is temporarily relieved by putting R UE overhead, letting her R UE "hang" increases pain, but pain will also return if her R UE is overhead for too long.  I also suspect possible lateral epicondylitis in RUE contributing to UE symptoms as well as possible severe carpal tunnel, especially given that she reports that her pain started with wrist issues and spread up to her elbow- additionally she is having some nerve signs in the form of involuntary R hand/finger shaking which was I was able to aggravate today with Tinel's testing. Unable  to perform finger opposition well due to this shaking and reports decreased  strength and ability to write/hold knives for cutting for her job. Interestingly, numbness is limited to her R palm only and does not spread into her fingers. Attempted some gentle exercises such as chin tucks but this increased pain, unable to attempt more due to time limitations of session today. Will have her come back to further investigate interventions such as supine traction and possible ionto to elbow, I do feel she will benefit from nerve conduction testing and possible MRI- will reach out to referring here.   OBJECTIVE IMPAIRMENTS: decreased coordination, decreased knowledge of condition, decreased ROM, decreased strength, hypomobility, increased fascial restrictions, increased muscle spasms, impaired sensation, impaired UE functional use, improper body mechanics, postural dysfunction, and pain.   ACTIVITY LIMITATIONS: carrying, lifting, bathing, toileting, dressing, self feeding, reach over head, hygiene/grooming, and caring for others  PARTICIPATION LIMITATIONS: meal prep, cleaning, laundry, driving, shopping, community activity, and occupation  PERSONAL FACTORS: Behavior pattern, Past/current experiences, Profession, Social background, and Time since onset of injury/illness/exacerbation are also affecting patient's functional outcome.   REHAB POTENTIAL: Fair complicated presentation with high copay likely limiting number of times patient will be able to be seen  CLINICAL DECISION MAKING: Unstable/unpredictable  EVALUATION COMPLEXITY: High   GOALS: Goals reviewed with patient? No  SHORT TERM GOALS: Target date: 12/29/2021  (Remove Blue Hyperlink)  Will be compliant with appropriate progressive HEP  Baseline: Goal status: INITIAL  2.  Will have better understanding of posture and general biomechanics  Baseline:  Goal status: INITIAL  3.  Pain to be no more than 4/10 in cervical spine, R elbow, and wrist  Baseline:  Goal status: INITIAL  4.  Cervical ROM to improve by at  least 15 degrees all planes to assist in reducing pain Baseline:  Goal status: INITIAL  5.  Limitations in thoracic lateral flexion and rotation to have been resolved as much as possible given hx of scoliosis  Baseline:  Goal status: INITIAL    LONG TERM GOALS: Target date: 01/26/2022  (Remove Blue Hyperlink)  MMT to improve by at least 1 grade in all weak groups  Baseline:  Goal status: INITIAL  2.  Cervical and R elbow/wrist pain to be no more than 2/10  Baseline:  Goal status: INITIAL  3.  Will be able to reliably hold and perform fine motor tasks with pen and knife safely for at least 60 minutes without increase in pain  Baseline:  Goal status: INITIAL  4.  Will be able to perform work duties for at least 4 hours with rest breaks PRN without increase in pain  Baseline:  Goal status: INITIAL  5.  Frequency and intensity of shaking in R hand/fingers to have improved by at least 50%  to allow for improved functional ability/show improvement of condition  Baseline:  Goal status: INITIAL  6.  FOTO score to have improved by at least 10 points  Baseline:  Goal status: INITIAL  PLAN:  PT FREQUENCY: 1-2x/week  PT DURATION: 8 weeks  PLANNED INTERVENTIONS: Therapeutic exercises, Therapeutic activity, Neuromuscular re-education, Balance training, Gait training, Patient/Family education, Self Care, Joint mobilization, Joint manipulation, Aquatic Therapy, Dry Needling, Electrical stimulation, Spinal manipulation, Spinal mobilization, Cryotherapy, Moist heat, Taping, Traction, Ultrasound, Ionotophoresis 4mg /ml Dexamethasone, Manual therapy, and Re-evaluation  PLAN FOR NEXT SESSION: need to check grip strength; try supine traction and supine chin tucks, possibly try DN. Any updates from referring on NCV and possible MRI? Gentle progression of  mobility as tolerated. Further investigate possible neuro sx PRN    Anahi Belmar U PT DPT PN2  12/01/2021, 3:50 PM

## 2021-12-06 ENCOUNTER — Encounter: Payer: Commercial Managed Care - HMO | Admitting: Rehabilitative and Restorative Service Providers"

## 2021-12-06 ENCOUNTER — Telehealth: Payer: Self-pay | Admitting: Surgery

## 2021-12-06 ENCOUNTER — Telehealth: Payer: Self-pay | Admitting: Family

## 2021-12-06 NOTE — Telephone Encounter (Signed)
Pt states: -calling for update -the problem with her arms is nerve damage. -because of financial reasons, she cannot be seen for nerve damage care until the latter part of November.   Pt asks: -does she need to come into the office to receive a two month extension (past the 15th of Nov.) of leave because of the new developments with nerve damage?   Pt Requests: -Call back with instructions for follow up.

## 2021-12-06 NOTE — Telephone Encounter (Signed)
Faxed back advising Jeneen Rinks did not take patient oow, to send to PCP.

## 2021-12-06 NOTE — Telephone Encounter (Signed)
Pt States: -She received a letter from nylife today 12/03/21 that states she did not see PCP in order to get a 10/18 extension letter for leave. -Specifically that patient did not see PCP. -Original end date of leave 11/25/21, extension to Nov. 15th. -She is still working with Doctor and therapists with orthopedics. Another extension will most likely be needed.   Pt Requests: -Call back about how to proceed.

## 2021-12-06 NOTE — Telephone Encounter (Signed)
Patient has submitted disability forms. Seen 10/18 (doesn't mention being taken oow)  Please advise of patient's work status. Thank you

## 2021-12-07 NOTE — Telephone Encounter (Signed)
I see short term disability blank forms are scanned in her chart. Why were they scanned? Do you have a printed copy? Also, since she is being treated by a specialist for this problem, they should be completing her letters and disability paperwork. Let me know about the forms, thx.

## 2021-12-08 NOTE — Telephone Encounter (Signed)
Let pt know. Pt thought forms were filled out and would like to know if you are able to fill these out for her. Informed her the specialist she sees should be filling out form and completing her letters. Pt gave a verbalized understanding and will be coming to pick up forms whether filled out or not.

## 2021-12-08 NOTE — Telephone Encounter (Signed)
Patient wants a call from Dulce Sellar - she doesn't understand why she needs to go to dr Lestine Mount for these forms - there is miscommunication that she would like to address today if possible.

## 2021-12-09 NOTE — Telephone Encounter (Signed)
Patient explained that she needs an extension note for her job since at this time she is set to go back to work on 11/15 per her previous extension letter.   Patient states: - She is set to continue PT on 11/22 but if she goes back to work on 11/15 then she would be reversing any progress that she has made on her hand  - She was informed by her orthopedist that she would need this extension letter from PCP  - States extension would need to be until mid- January since that is when her PT will be wrapping up   Patient requests: - PCP provide an extension letter until mid January as soon as possible   Patient is willing to have an OV if needed for extension.

## 2021-12-12 NOTE — Telephone Encounter (Signed)
Pt will need office visit for her paperwork. Please schedule asap, thx.

## 2021-12-13 ENCOUNTER — Encounter: Payer: Self-pay | Admitting: Family

## 2021-12-13 ENCOUNTER — Ambulatory Visit (INDEPENDENT_AMBULATORY_CARE_PROVIDER_SITE_OTHER): Payer: Commercial Managed Care - HMO | Admitting: Family

## 2021-12-13 VITALS — BP 105/72 | HR 84 | Temp 97.8°F | Ht 59.0 in | Wt 165.0 lb

## 2021-12-13 DIAGNOSIS — M25511 Pain in right shoulder: Secondary | ICD-10-CM

## 2021-12-13 MED ORDER — CYCLOBENZAPRINE HCL 10 MG PO TABS: 10.0000 mg | ORAL_TABLET | Freq: Three times a day (TID) | ORAL | 0 refills | Status: DC | PRN

## 2021-12-13 NOTE — Telephone Encounter (Signed)
Pt was seen today 12/13/21 with Jill Evans

## 2021-12-13 NOTE — Progress Notes (Signed)
Patient ID: Jill Evans, female    DOB: 05/08/1990, 31 y.o.   MRN: [id number]  Chief Complaint  Patient presents with   Shoulder Pain    Pt wants to know why its so hard to get extension note.     HPI: Right arm pain:   original pain started approx. 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 Reports shooting pain from neck, shoulder down to hand, tingling in palm of hand with numbness. Pt given Prednisone pack and Flexeril during visit on 11/10/2021 as well as taking Ibuprofen at home. Pt then called back stating no relief with meds and needed ORTHO referral. Seen by Ortho on 10/18 and given steroid injection, cervical & right shoulder xrays completed, but no results are in chart, possibly as pt was instructed to f/u with Dr. Lorin Mercy 3 weeks later, which pt has not done, states she was not aware of this. Also a nerve conduction study & PT ordered. But pt states she can't afford the NCS test or PT and waiting to be able to pay. She is here today to have FMLA paper work completed.  Assessment & Plan:  1. Acute pain of right shoulder - completed FMLA paperwork today for [employer] ins. Advised pt to get this done in future by Ortho since I referred her to them for this problem and they are treating her currently. Pt is unable to work to work due to continued pain, neuropathy & weakness in the shoulder, arm, & hand. Advised to also not overdo work at home with heavy lifting, pulling or pushing.   - cyclobenzaprine (FLEXERIL) 10 MG tablet; Take 1 tablet (10 mg total) by mouth 3 (three) times daily as needed for muscle spasms.  Dispense: 90 tablet; Refill: 0  Subjective:    Outpatient Medications Prior to Visit  Medication Sig Dispense Refill   ibuprofen (ADVIL) 200 MG tablet Take 200 mg by mouth every 6 (six) hours as needed for moderate pain.     cyclobenzaprine (FLEXERIL) 10 MG tablet Take 1 tablet (10 mg total) by mouth 3 (three)  times daily as needed for muscle spasms. 90 tablet 0   predniSONE (DELTASONE) 10 MG tablet Take every morning. 6 tab day 1, 5 tab day 2, 4 tab day 3-4, 3 tab day 5, 2 tab day 6 (Patient not taking: Reported on 11/17/2021) 26 tablet 0   No facility-administered medications prior to visit.   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    Pregnancy induced hypertension    Rh negative state in antepartum period 10/26/2017   [x]  rhogam 9/24   Severe preeclampsia, delivered 10/24/2017     Guidelines for Antenatal Testing and Sonography  (with updated ICD-10 codes)  Updated  11/06/16 INDICATION U/S NST/AFI or BPP wkly DELIVERY       CHTN - O10.919  Group I   BP <  140/90, no preeclampsia, AGA,  nml AFV, +/- meds    Group II   BP > 140/90, on meds, no preeclampsia, AGA, nml AFV  20-28-34-38  20-24-28-32-35-38  32//2 x wk  28//BPP wkly then 32//2 x wk or BPP wkly  40 no meds; 39 med   Supervision of high risk pregnancy due to social problems 10/26/2017   Wanting to give up for adoption ([employer])   Supervision of high risk pregnancy, antepartum 10/10/2017    Nursing Staff Provider  Office Location  [place] Dating   third trimester ultrasound  Language   English Anatomy US    Flu Vaccine  10/10/17 Genetic Screen  NIPS:   AFP:   First Screen:  Quad:    TDaP vaccine   10/10/17 Hgb A1C or  GTT Early  Third trimester ha1c 5.1  Rhogam   10-25-17   LAB RESULTS   Feeding Plan  Blood Type  --/--/B NEG (09/28 1322) B negative  Contraception  Antibody POS (09/28 132   Past Surgical History:  Procedure Laterality Date   CESAREAN SECTION N/A 11/06/2017   Procedure: CESAREAN SECTION;  Surgeon: Conan Bowens, MD;  Location: [facility] BIRTHING SUITES;  Service: Obstetrics;  Laterality: N/A;   mirena     inserted 2019   No Known Allergies    Objective:    Physical Exam Vitals and nursing note reviewed.  Constitutional:      Appearance: Normal appearance.  Cardiovascular:     Rate and Rhythm: Normal rate and regular rhythm.  Pulmonary:     Effort: Pulmonary effort is normal.     Breath sounds: Normal breath sounds.  Musculoskeletal:     Right shoulder: Bony tenderness present. Decreased range of motion.  Skin:    General: Skin is warm and dry.  Neurological:     Mental Status: She is alert.  Psychiatric:        Mood and Affect: Mood normal.        Behavior: Behavior normal.    BP 105/72 (BP Location: Left Arm, Patient Position: Sitting)   Pulse 84   Temp 97.8 F (36.6 C) (Temporal)   Ht 4\' 11"  (1.499 m)   Wt 165 lb (74.8 kg)   LMP 11/10/2021 (Exact Date)   SpO2 99%   BMI 33.33 kg/m  Wt Readings from Last 3 Encounters:  12/13/21 165 lb (74.8 kg)  11/17/21 161 lb (73 kg)  11/10/21 161 lb 12.8 oz (73.4 kg)       01/10/22, NP

## 2021-12-13 NOTE — Patient Instructions (Signed)
It was very nice to see you today!   I have completed your Medical Request form for [employer] and we will fax this form along with office notes to them today.  You need to call Dr Ophelia Charter office at the [facility] office you went to originally, as you were supposed to follow up with him in 3 weeks after your original visit. They have not reviewed the xrays you had done, so these can not be sent with the office notes to [facility] [employer] at this time.  Follow up with the Orthopedic office for future care regarding your right shoulder injury and they should supply future FMLA form completion.      PLEASE NOTE:  If you had any lab tests please let us know if you have not heard back within a few days. You may see your results on MyChart before we have a chance to review them but we will give you a call once they are reviewed by [place]. If we ordered any referrals today, please let us know if you have not heard from their office within the next week.

## 2021-12-17 ENCOUNTER — Encounter: Payer: Self-pay | Admitting: Radiology

## 2021-12-17 ENCOUNTER — Ambulatory Visit (INDEPENDENT_AMBULATORY_CARE_PROVIDER_SITE_OTHER): Payer: Commercial Managed Care - HMO | Admitting: Radiology

## 2021-12-17 VITALS — BP 122/74

## 2021-12-17 DIAGNOSIS — Z3201 Encounter for pregnancy test, result positive: Secondary | ICD-10-CM | POA: Diagnosis not present

## 2021-12-17 DIAGNOSIS — N912 Amenorrhea, unspecified: Secondary | ICD-10-CM

## 2021-12-17 DIAGNOSIS — R350 Frequency of micturition: Secondary | ICD-10-CM

## 2021-12-17 DIAGNOSIS — N3091 Cystitis, unspecified with hematuria: Secondary | ICD-10-CM

## 2021-12-17 MED ORDER — SULFAMETHOXAZOLE-TRIMETHOPRIM 800-160 MG PO TABS: 1.0000 | ORAL_TABLET | Freq: Two times a day (BID) | ORAL | 0 refills | Status: DC

## 2021-12-17 NOTE — Progress Notes (Signed)
SUBJECTIVE: Jill Evans is a 31 y.o. female who complains of urinary frequency, urgency and dysuria x 7 days, without flank pain, fever, chills, or abnormal vaginal discharge or bleeding. Missed menses, positive pregnancy test at home.  No Known Allergies  Current Outpatient Medications on File Prior to Visit  Medication Sig Dispense Refill   cyclobenzaprine (FLEXERIL) 10 MG tablet Take 1 tablet (10 mg total) by mouth 3 (three) times daily as needed for muscle spasms. 90 tablet 0   ibuprofen (ADVIL) 200 MG tablet Take 200 mg by mouth every 6 (six) hours as needed for moderate pain.     No current facility-administered medications on file prior to visit.    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    Pregnancy induced hypertension    Rh negative state in antepartum period 10/26/2017   [x]  rhogam 9/24   Severe preeclampsia, delivered 10/24/2017     Guidelines for Antenatal Testing and Sonography  (with updated ICD-10 codes)  Updated  Nov 12, 2016 INDICATION U/S NST/AFI or BPP wkly DELIVERY       CHTN - O10.919  Group I   BP < 140/90, no preeclampsia, AGA,  nml AFV, +/- meds    Group II   BP > 140/90, on meds, no preeclampsia, AGA, nml AFV  20-28-34-38  20-24-28-32-35-38  32//2 x wk  28//BPP wkly then 32//2 x wk or BPP wkly  40 no meds; 39 med   Supervision of high risk pregnancy due to social problems 10/26/2017   Wanting to give up for adoption (10/28/2017)   Supervision of high risk pregnancy, antepartum 10/10/2017    Nursing Staff Provider  Office Location  [place] Dating   third trimester ultrasound  Language   English Anatomy 12/10/2017    Flu Vaccine  10/10/17 Genetic Screen  NIPS:   AFP:   First Screen:  Quad:    TDaP vaccine   10/10/17 Hgb A1C or  GTT Early  Third trimester ha1c 5.1  Rhogam   10-25-17   LAB RESULTS   Feeding Plan  Blood Type --/--/B NEG (09/28 1322) B negative  Contraception  Antibody POS (09/28 132     OBJECTIVE: Appears well, in no apparent distress.  Vital signs are normal. The abdomen is soft without tenderness, guarding, mass, rebound or organomegaly. No CVA tenderness or inguinal adenopathy noted. Urine dipstick shows positive for RBC's and positive for leukocytes.  Micro exam:  WBC's per HPF,  RBC's per HPF, and + bacteria.    Blood pressure 122/74, last menstrual period 11/10/2021.    Chaperone offered and declined for exam.  ASSESSMENT/PLAN: 1. Cystitis with hematuria - sulfamethoxazole-trimethoprim (BACTRIM DS) 800-160 MG tablet; Take 1 tablet by mouth 2 (two) times daily.  Dispense: 14 tablet; Refill: 0  2. Urinary frequency - Urinalysis,Complete w/RFL Culture  3. Amenorrhea - Pregnancy, urine  4. Positive pregnancy test Referral to local OB office Begin pnv  Stop ibuprofen  Will send urine culture and sensitivity.  Treatment per orders - also push fluids, avoid bladder irritants. Instructed she may use Pyridium OTC prn. Call or return to clinic prn if these symptoms worsen or fail to improve as anticipated. Pyelo precautions reviewed with patient.

## 2021-12-19 LAB — URINALYSIS, COMPLETE W/RFL CULTURE
Bilirubin Urine: NEGATIVE
Glucose, UA: NEGATIVE
Hyaline Cast: NONE SEEN /LPF
Ketones, ur: NEGATIVE
Nitrites, Initial: NEGATIVE
Protein, ur: NEGATIVE
Specific Gravity, Urine: 1.025 (ref 1.001–1.035)
pH: 5 (ref 5.0–8.0)

## 2021-12-19 LAB — URINE CULTURE
MICRO NUMBER:: 14204741
SPECIMEN QUALITY:: ADEQUATE

## 2021-12-19 LAB — CULTURE INDICATED

## 2021-12-19 LAB — PREGNANCY, URINE: Preg Test, Ur: POSITIVE — AB

## 2021-12-20 ENCOUNTER — Telehealth: Payer: Self-pay | Admitting: Family

## 2021-12-20 NOTE — Telephone Encounter (Signed)
I called and spoke with pt to let her know per Judeth Cornfield- She would have to speak with her OBGYN in regards to medications she is able to take that is safe for pregnancy. Pt gave a verbalized understanding.

## 2021-12-20 NOTE — Telephone Encounter (Signed)
Pt states: -Saw OB/GYN today 11/20 and patient is [redacted] weeks pregnant. -She's dropping ibuprofen and Flexeril and would like feed back about alternatives. -OB/GYN uses [facility].   Pt requests: -Follow up from PCP team regarding medications and any other follow up needed for current care connected to pregnancy diagnosis.

## 2021-12-21 ENCOUNTER — Ambulatory Visit: Payer: Commercial Managed Care - HMO | Admitting: Radiology

## 2021-12-22 ENCOUNTER — Ambulatory Visit (INDEPENDENT_AMBULATORY_CARE_PROVIDER_SITE_OTHER): Payer: Commercial Managed Care - HMO | Admitting: Physical Therapy

## 2021-12-22 ENCOUNTER — Encounter: Payer: Self-pay | Admitting: Physical Therapy

## 2021-12-22 DIAGNOSIS — R29818 Other symptoms and signs involving the nervous system: Secondary | ICD-10-CM

## 2021-12-22 DIAGNOSIS — M79601 Pain in right arm: Secondary | ICD-10-CM | POA: Diagnosis not present

## 2021-12-22 DIAGNOSIS — R293 Abnormal posture: Secondary | ICD-10-CM

## 2021-12-22 DIAGNOSIS — M6281 Muscle weakness (generalized): Secondary | ICD-10-CM | POA: Diagnosis not present

## 2021-12-22 DIAGNOSIS — R29898 Other symptoms and signs involving the musculoskeletal system: Secondary | ICD-10-CM

## 2021-12-22 DIAGNOSIS — M542 Cervicalgia: Secondary | ICD-10-CM | POA: Diagnosis not present

## 2021-12-22 NOTE — Therapy (Addendum)
OUTPATIENT PHYSICAL THERAPY SHOULDER NOTE  DISCHARGE SUMMARY   Patient Name: Jill Evans MRN: [id number] DOB:09/04/1990, 31 y.o., female Today's Date: 12/22/2021   PT End of Session - 12/22/21 1602     Visit Number 2    Number of Visits 17    Date for PT Re-Evaluation 01/26/22    Authorization Type [employer]    Authorization Time Period 12/01/21 to 01/26/22    Authorization - Number of Visits 30    PT Start Time 6734    PT Stop Time 1937    PT Time Calculation (min) 41 min    Activity Tolerance Patient limited by pain    Behavior During Therapy [facility] for tasks assessed/performed              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    Pregnancy induced hypertension    Rh negative state in antepartum period 10/26/2017   _0  rhogam 9/24   Severe preeclampsia, delivered 10/24/2017     Guidelines for Antenatal Testing and Sonography  (with updated ICD-10 codes)  Updated  11-06-16 INDICATION U/S NST/AFI or BPP wkly DELIVERY       CHTN - O10.919  Group I   BP < 140/90, no preeclampsia, AGA,  nml AFV, +/- meds    Group II   BP > 140/90, on meds, no preeclampsia, AGA, nml AFV  20-28-34-38  20-24-28-32-35-38  32//2 x wk  28//BPP wkly then 32//2 x wk or BPP wkly  40 no meds; 39 med   Supervision of high risk pregnancy due to social problems 10/26/2017   Wanting to give up for adoption ([employer])   Supervision of high risk pregnancy, antepartum  10/10/2017    Nursing Staff Provider  Office Location  [place] Dating   third trimester ultrasound  Language   English Anatomy US    Flu Vaccine  10/10/17 Genetic Screen  NIPS:   AFP:   First Screen:  Quad:    TDaP vaccine   10/10/17 Hgb A1C or  GTT Early  Third trimester ha1c 5.1  Rhogam   10-25-17   LAB RESULTS   Feeding Plan  Blood Type --/--/B NEG (09/28 1322) B negative  Contraception  Antibody POS (09/28 132   Past Surgical History:  Procedure Laterality Date   CESAREAN SECTION N/A 11/06/2017   Procedure: CESAREAN SECTION;  Surgeon: Sloan Leiter, MD;  Location: [place];  Service: Obstetrics;  Laterality: N/A;   mirena     inserted 2019   Patient Active Problem List   Diagnosis Date Noted   Abnormal weight gain 09/22/2021   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   BMI 30s 10/26/2017   Hypothyroidism    Vitamin D deficiency 06/30/2015    PCP: Jeanie Sewer   REFERRING PROVIDER: Benjiman Core   REFERRING DIAG: M25.511 (ICD-10-CM) - Acute pain of right shoulder M54.2 (ICD-10-CM) - Neck pain   THERAPY DIAG:  Cervicalgia  Pain in right arm  Muscle weakness (generalized)  Abnormal posture  Other symptoms and signs involving the nervous system  Other symptoms and signs involving the musculoskeletal system  Rationale for Evaluation and Treatment: Rehabilitation  ONSET DATE: 12/01/21  SUBJECTIVE:                                                                                                                                                                                      SUBJECTIVE STATEMENT:  There's been a lot going on since last time I was seen here; I've had a lot more kinks going on especially during sleeping. The shaking is not necessarily getting better, it feels like an anvil is sitting on my shoulder. You could say that its getting weaker. I don't sleep with my head above anymore because I'm scared of increased pain. I don't feel any strength,  if I grip anything or try to grip something I have no grip. I feel like its much weaker there's no tension like you get when you flex a muscle. Still having numbness in palm and wrist but I honestly can't tell if its worse. My PCP told me that I need to keep following up with Jeneen Rinks and PT. Newly pregnant, only 6 weeks now. Can't sleep on my arm/can't do anything with the arm, it feels limp laying next to me. I try to use it more but I'm noticing its not helping. Feeling tendonitis in other elbow. Jeneen Rinks never said anything about MRI or NCV.   PERTINENT HISTORY: HPI 31 year old white female who is new patient to clinic comes in today with complaints of neck pain, right shoulder pain and intermittent right arm pain.  Patient has had problems been ongoing for a few weeks.  Denies injury.  She is having some pain in the neck that goes down into her right shoulder blade and into the right arm.  She has had problems with right shoulder overactivity and reaching down her back.  Pain when she lays directly on the right shoulder.  She is right-hand dominant.  Intermittent numbness and tingling in the right hand.  She was seen by primary care provider November 10, 2021 and was prescribed prednisone pack and Flexeril.  States that she really has not had any improvement. Review of Systems No current complaints of cardiopulmonary GI/GU issues   Objective: Vital Signs: BP 116/75   Pulse 100   Ht _0  (1.499 m)   Wt 161 lb (73 kg)   LMP 10/08/2021   BMI 32.52 kg/m    Physical Exam HENT:     Head: Normocephalic and atraumatic.  Eyes:     Extraocular Movements:  Extraocular movements intact.  Musculoskeletal:     Comments: Cervical spine good range of motion but with some soreness on the right.  No brachial plexus or trapezius tenderness.  Some pain along the right medial scapular border.  Right shoulder flexion/abduction to about 140-150 degrees with discomfort.  Limited internal rotation behind her back due  to pain.  Negative drop arm test.  Markedly positive impingement test.  Pain with supraspinatus resistance.  Proximal biceps tendon tender.  No palpable tendon defect.  Left shoulder unremarkable.  No focal motor deficits.  Neurological:   PAIN:  Are you having pain? Yes: NPRS scale: 10.5/10 Pain location: mainly in elbow and shoulder blade R  Pain description: sharp shooting pain  Aggravating factors: extending arm at certain point, trying to exercise it  Relieving factors: nothing since I can't take medicines due to pregnancy   PRECAUTIONS: None  WEIGHT BEARING RESTRICTIONS: No  FALLS:  Has patient fallen in last 6 months? No  LIVING ENVIRONMENT: Lives with: lives with their family Lives in: House/apartment Stairs:  2 flights, U rail  Has following equipment at home: None  OCCUPATION: Cuts fruit in retail   PLOF: Independent and Independent with basic ADLs  PATIENT GOALS:get back to work   OBJECTIVE:   DIAGNOSTIC FINDINGS:    PATIENT SURVEYS:  FOTO 51  COGNITION: Overall cognitive status: Within functional limits for tasks assessed     SENSATION: Not tested today due to time limits of session, does report numbness in palm of R hand only  POSTURE: Scoliotic curve noted, forward head, flexed at hips   UPPER EXTREMITY ROM:   Active ROM Right eval Left eval  Shoulder flexion 163 WNL  Shoulder extension    Shoulder abduction 138 171  Shoulder adduction    Shoulder internal rotation FIR to mid R glute  FIR L1   Shoulder external rotation FER occiput  FER C4   Elbow flexion 125 with surging, throbbing pain   Elbow extension 4   Wrist flexion    Wrist extension    Wrist ulnar deviation    Wrist radial deviation    Wrist pronation    Wrist supination    (Blank rows = not tested)  Cervical flexion 32* with squeezing pain deltoid to elbow, cervical extension 5 degrees with "kinking" in neck, cervical lateral flexion R 25* L 22* with R sided neck pain,  cervical rotation severe limitation B   Thoracic flexion/extension WNL but with R rotator cuff pain, thoracic lateral flexion L severe limitation R moderate limitation, thoracic rotation mild limitation B   UPPER EXTREMITY MMT:  MMT Right eval Left eval  Shoulder flexion 4- 4+  Shoulder extension 4 4  Shoulder abduction 4 4  Shoulder adduction    Shoulder internal rotation 4 4+  Shoulder external rotation 4 4+  Middle trapezius    Lower trapezius    Elbow flexion    Elbow extension    Wrist flexion    Wrist extension    Wrist ulnar deviation    Wrist radial deviation    Wrist pronation    Wrist supination    Grip strength (lbs)    (Blank rows = not tested)  Unable to perform finger opposition due to hand shaking   SHOULDER SPECIAL TESTS:  Spurlings test (+) B Some relief noted with gentle cervical traction sitting but unable to tolerate in sitting due to shoulder pain with traction- may need to try this supine  Carpal tunnel syndrome testing +  and caused more shaking of hand  Tinels test caused increased shaking in hand/fingers Of note stretching wrist flexors causes increased pain and hand shaking   JOINT MOBILITY TESTING:    PALPATION:  Severe mm spasms noted in thoracic and cervical paraspinals,upper trap R not too bad, very TTP and guarded RUE as a whole, R elbow mm very tight and sore- suspecting some probable lateral tendonitis at R elbow joint   TODAY'S TREATMENT:                                                                                                                                         DATE:   12/22/21  TherEx  Attempted supine UT stretches unable to tolerate due to nerve pains/sharp pains Attempted cervical PROM but unable to tolerate it Attempted isometric cervical exercise- extension (unable to tolerate), cervical lateral flexion (unable to tolerate R, tolerated L), cervical rotation (able to tolerate B but increased symptoms) Supine  scapular retractions x5 Supine chin tucks x3 unable to tolerate due to pain    Manual  Attempted gentle cervical traction- unable to tolerate due to increased sharp pain and nerve pain Suboccipital release 3x30 seconds   12/01/21- eval only    PATIENT EDUCATION: Education details: extensive education that pain/intensity of pain/irritability of pain is worse and need for further imaging/further workup from ortho and Dr Lorin Mercy before returning to PT  Person educated: Patient Education method: Explanation Education comprehension: verbalized understanding  HOME EXERCISE PROGRAM: TBD   ASSESSMENT:  CLINICAL IMPRESSION:  Jill Evans arrives today not doing well- per her description today it certainly sounds like her symptoms may be getting worse, for example it definitely sounds like her grip is getting worse, also sounds like pain is worsening in intensity and irritability too. We tried multiple interventions and she was unable to tolerate any of them today due to increased pain (both MSK and nerve pain). I continue to feel she has neurological involvement in some shape or form. At this point I feel she needs to be seen again by ortho and have MRI and possibly NCV done before continuing with therapy- her presentation is quite complex and I am concerned that PT may aggravate her condition without information from additional imaging. We will have her come back after seeing Dr. Hillery Hunter having additional imaging done but pending outcome of that appointment may need to go on hold for PT until medical team completes further workup.   OBJECTIVE IMPAIRMENTS: decreased coordination, decreased knowledge of condition, decreased ROM, decreased strength, hypomobility, increased fascial restrictions, increased muscle spasms, impaired sensation, impaired UE functional use, improper body mechanics, postural dysfunction, and pain.   ACTIVITY LIMITATIONS: carrying, lifting, bathing, toileting, dressing, self  feeding, reach over head, hygiene/grooming, and caring for others  PARTICIPATION LIMITATIONS: meal prep, cleaning, laundry, driving, shopping, community activity, and occupation  PERSONAL FACTORS: Behavior pattern, Past/current experiences, Profession, Social background, and Time  since onset of injury/illness/exacerbation are also affecting patient's functional outcome.   REHAB POTENTIAL: Fair complicated presentation with high copay likely limiting number of times patient will be able to be seen  CLINICAL DECISION MAKING: Unstable/unpredictable  EVALUATION COMPLEXITY: High   GOALS: Goals reviewed with patient? No  SHORT TERM GOALS: Target date: 12/29/2021  (Remove Blue Hyperlink)  Will be compliant with appropriate progressive HEP  Baseline: Goal status: INITIAL  2.  Will have better understanding of posture and general biomechanics  Baseline:  Goal status: INITIAL  3.  Pain to be no more than 4/10 in cervical spine, R elbow, and wrist  Baseline:  Goal status: INITIAL  4.  Cervical ROM to improve by at least 15 degrees all planes to assist in reducing pain Baseline:  Goal status: INITIAL  5.  Limitations in thoracic lateral flexion and rotation to have been resolved as much as possible given hx of scoliosis  Baseline:  Goal status: INITIAL    LONG TERM GOALS: Target date: 01/26/2022  (Remove Blue Hyperlink)  MMT to improve by at least 1 grade in all weak groups  Baseline:  Goal status: INITIAL  2.  Cervical and R elbow/wrist pain to be no more than 2/10  Baseline:  Goal status: INITIAL  3.  Will be able to reliably hold and perform fine motor tasks with pen and knife safely for at least 60 minutes without increase in pain  Baseline:  Goal status: INITIAL  4.  Will be able to perform work duties for at least 4 hours with rest breaks PRN without increase in pain  Baseline:  Goal status: INITIAL  5.  Frequency and intensity of shaking in R hand/fingers to have  improved by at least 50%  to allow for improved functional ability/show improvement of condition  Baseline:  Goal status: INITIAL  6.  FOTO score to have improved by at least 10 points  Baseline:  Goal status: INITIAL  PLAN:  PT FREQUENCY: 1-2x/week  PT DURATION: 8 weeks  PLANNED INTERVENTIONS: Therapeutic exercises, Therapeutic activity, Neuromuscular re-education, Balance training, Gait training, Patient/Family education, Self Care, Joint mobilization, Joint manipulation, Aquatic Therapy, Dry Needling, Electrical stimulation, Spinal manipulation, Spinal mobilization, Cryotherapy, Moist heat, Taping, Traction, Ultrasound, Ionotophoresis 43m/ml Dexamethasone, Manual therapy, and Re-evaluation  PLAN FOR NEXT SESSION: what did Dr. YLorin Mercysay? Consider going on hold vs DC  if she still has not had imaging done    Sommer Spickard U PT DPT PN2  12/22/2021, 4:02 PM    PHYSICAL THERAPY DISCHARGE SUMMARY  Visits from Start of Care: 2  Current functional level related to goals / functional outcomes: See above   Remaining deficits: See above   Education / Equipment: HEP   Patient agrees to discharge. Patient goals were not met. Patient is being discharged due to a change in medical status.   SLaureen Abrahams PT, DPT 01/17/22 2:27 PM  [facility] [address] [address]Phone: [phone]  Fax:  [phone]

## 2021-12-30 ENCOUNTER — Ambulatory Visit: Payer: Commercial Managed Care - HMO | Admitting: Surgery

## 2021-12-30 ENCOUNTER — Encounter: Payer: Self-pay | Admitting: Physical Therapy

## 2021-12-30 NOTE — Therapy (Signed)
Reached out to referring PA-C Zonia Kief) and ortho MD she is scheduled to see on 12/8 Annell Greening) via Epic secure message with PT concerns and reasoning for going on hold from PT until after seeing Dr. Ophelia Charter and potentially after additional imaging.   Lerry Liner PT DPT PN2

## 2021-12-31 ENCOUNTER — Telehealth: Payer: Self-pay | Admitting: Radiology

## 2021-12-31 NOTE — Telephone Encounter (Signed)
I called patient to try and schedule earlier return appointment. She shares a car with her partner and was unable to come in at any earlier offered appointment times.She will come back to the office for follow up on 01/07/2022 as already scheduled. Reaching out to patient per messages below between Dr. Ophelia Charter and PT.   Eldred Manges, MD  Milinda Pointer, PT; Marybelle Killings, RT Looks good her back in the office so I can see her and check her.  Need documentation that PT is not effective since some insurance require 6 weeks of PT before we can proceed with either MRI or EMGs nerve conduction velocities etc.  Thanks       Previous Messages    ----- Message ----- From: Milinda Pointer, PT Sent: 12/30/2021  12:47 PM EST To: Eldred Manges, MD Subject: PT concerns about this patient                Hello Dr. Ophelia Charter,  This patient comes to see you on 01/07/22.  I did her PT evaluation on 11/1 and saw her again for follow-up on 11/22.  I am very concerned that she has nerve involvement, or other neuro involvement of some sort causing or contributing to her symptoms. She has been unable to tolerate any traditional PT interventions due to pain. Also, her symptoms have been progressing as she was much worse functionally and pain wise the second time I saw her.  I did message her referring PA-C about possible MRI and/or NCV but never heard anything back.  She is on hold from PT until after her visit with you and any imaging you feel might be appropriate.  Please let me know your thoughts,  Thank you!  Lerry Liner PT DPT PN2

## 2022-01-03 ENCOUNTER — Telehealth: Payer: Self-pay | Admitting: Surgery

## 2022-01-03 NOTE — Telephone Encounter (Signed)
I called patient and advised ok for note per Dr. Ophelia Charter. She will come and pick it up.

## 2022-01-03 NOTE — Telephone Encounter (Signed)
Please advise. We have scheduled patient for return office visit on 01/07/2022 as PT is concerned that she has nerve related problem and she was not progressing during PT.

## 2022-01-03 NOTE — Telephone Encounter (Signed)
Patient called asked she can get a letter stating she is to remain out of work until 02/27/2022. The number to contact patient is [phone]

## 2022-01-07 ENCOUNTER — Encounter: Payer: Self-pay | Admitting: Orthopaedic Surgery

## 2022-01-07 ENCOUNTER — Ambulatory Visit (INDEPENDENT_AMBULATORY_CARE_PROVIDER_SITE_OTHER): Payer: Commercial Managed Care - HMO | Admitting: Orthopaedic Surgery

## 2022-01-07 ENCOUNTER — Telehealth: Payer: Self-pay | Admitting: Orthopaedic Surgery

## 2022-01-07 VITALS — BP 104/66 | HR 84

## 2022-01-07 DIAGNOSIS — M5412 Radiculopathy, cervical region: Secondary | ICD-10-CM

## 2022-01-07 NOTE — Progress Notes (Unsigned)
Office Visit Note   Patient: Jill Evans           Date of Birth: 05-26-1990           MRN: [id number] Visit Date: 01/07/2022              Requested by: Dulce Sellar, NP [address] [place],  [place] [address] PCP: Dulce Sellar, NP   Assessment & Plan: Visit Diagnoses: No diagnosis found.  Plan: ***  Follow-Up Instructions: No follow-ups on file.   Orders:  No orders of the defined types were placed in this encounter.  No orders of the defined types were placed in this encounter.     Procedures: No procedures performed   Clinical Data: No additional findings.   Subjective: Chief Complaint  Patient presents with   Right Shoulder - Pain    HPI  Review of Systems   Objective: Vital Signs: BP 104/66   Pulse 84   LMP 11/10/2021 (Exact Date)   Physical Exam  Ortho Exam  Specialty Comments:  No specialty comments available.  Imaging: No results found.   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   BMI 30s 10/26/2017   Hypothyroidism    Vitamin D deficiency 06/30/2015   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    Pregnancy induced hypertension    Rh negative state in antepartum period 10/26/2017   [x]  rhogam 9/24   Severe preeclampsia, delivered 10/24/2017     Guidelines for Antenatal Testing and  Sonography  (with updated ICD-10 codes)  Updated  Oct 27, 2016 INDICATION U/S NST/AFI or BPP wkly DELIVERY       CHTN - O10.919  Group I   BP < 140/90, no preeclampsia, AGA,  nml AFV, +/- meds    Group II   BP > 140/90, on meds, no preeclampsia, AGA, nml AFV  20-28-34-38  20-24-28-32-35-38  32//2 x wk  28//BPP wkly then 32//2 x wk or BPP wkly  40 no meds; 39 med   Supervision of high risk pregnancy due to social problems 10/26/2017   Wanting to give up for adoption (10/28/2017)   Supervision of high risk pregnancy, antepartum 10/10/2017    Nursing Staff Provider  Office Location  [place] Dating   third trimester ultrasound  Language   English Anatomy 12/10/2017    Flu Vaccine  10/10/17 Genetic Screen  NIPS:   AFP:   First Screen:  Quad:    TDaP vaccine   10/10/17 Hgb A1C or  GTT Early  Third trimester ha1c 5.1  Rhogam   10-25-17   LAB RESULTS   Feeding Plan  Blood Type --/--/B NEG (09/28 1322) B negative  Contraception  Antibody POS (09/28 132    Family History  Problem Relation Age of Onset   Deep vein thrombosis Mother  Hypertension Mother    Obesity Mother    Other Mother        Lyme's disease   Depression Mother    Diabetes Mother    Varicose Veins Mother    Hypertension Father    Cancer Father    Obesity Father    Cancer Maternal Grandfather    Cancer Paternal Grandfather     Past Surgical History:  Procedure Laterality Date   CESAREAN SECTION N/A 11/06/2017   Procedure: CESAREAN SECTION;  Surgeon: Conan Bowens, MD;  [facility];  Service: Obstetrics;  Laterality: N/A;   mirena     inserted 2019   Social History   Occupational History   Not on file  Tobacco Use   Smoking status: Never   Smokeless tobacco: Never  Vaping Use   Vaping Use: Never used  Substance and Sexual Activity   Alcohol use: No   Drug use: No   Sexual activity: Yes    Partners: Male    Birth control/protection: Spermicide    Comment: Just took iud out after 4 years

## 2022-01-07 NOTE — Telephone Encounter (Signed)
[employer] forms received. To [employer]. °

## 2022-01-10 DIAGNOSIS — M5412 Radiculopathy, cervical region: Secondary | ICD-10-CM | POA: Insufficient documentation

## 2022-01-27 ENCOUNTER — Encounter: Payer: Commercial Managed Care - HMO | Admitting: Physical Therapy

## 2022-01-28 ENCOUNTER — Other Ambulatory Visit (HOSPITAL_COMMUNITY): Payer: Self-pay

## 2022-01-28 MED ORDER — PAXLOVID (300/100) 20 X 150 MG & 10 X 100MG PO TBPK: 3.0000 | ORAL_TABLET | Freq: Two times a day (BID) | ORAL | 0 refills | Status: DC

## 2022-01-31 HISTORY — DX: Maternal care for unspecified type scar from previous cesarean delivery: O34.219

## 2022-02-08 ENCOUNTER — Telehealth: Payer: Self-pay | Admitting: Orthopaedic Surgery

## 2022-02-08 ENCOUNTER — Encounter: Payer: Commercial Managed Care - HMO | Admitting: Physical Therapy

## 2022-02-08 DIAGNOSIS — M25511 Pain in right shoulder: Secondary | ICD-10-CM

## 2022-02-08 DIAGNOSIS — M5412 Radiculopathy, cervical region: Secondary | ICD-10-CM

## 2022-02-08 NOTE — Telephone Encounter (Signed)
Referral entered  

## 2022-02-08 NOTE — Telephone Encounter (Signed)
Patient was here. Need a new PT referral placed in Epic.

## 2022-02-10 ENCOUNTER — Other Ambulatory Visit: Payer: Self-pay | Admitting: Obstetrics and Gynecology

## 2022-02-10 ENCOUNTER — Encounter: Payer: Self-pay | Admitting: Obstetrics and Gynecology

## 2022-02-10 DIAGNOSIS — Q958 Other balanced rearrangements and structural markers: Secondary | ICD-10-CM

## 2022-02-14 ENCOUNTER — Ambulatory Visit (INDEPENDENT_AMBULATORY_CARE_PROVIDER_SITE_OTHER): Payer: Commercial Managed Care - HMO | Admitting: Physical Therapy

## 2022-02-14 DIAGNOSIS — R293 Abnormal posture: Secondary | ICD-10-CM | POA: Diagnosis not present

## 2022-02-14 DIAGNOSIS — M79601 Pain in right arm: Secondary | ICD-10-CM

## 2022-02-14 DIAGNOSIS — M6281 Muscle weakness (generalized): Secondary | ICD-10-CM

## 2022-02-14 DIAGNOSIS — M542 Cervicalgia: Secondary | ICD-10-CM

## 2022-02-14 NOTE — Therapy (Addendum)
OUTPATIENT PHYSICAL THERAPY CERVICAL EVALUATION   Patient Name: Jill Evans MRN: [id number] DOB:[birth year], 32 y.o., female Today's Date: 02/15/2022  END OF SESSION:  PT End of Session - 02/15/22 0827     Visit Number 1    Number of Visits 17    Date for PT Re-Evaluation 05/02/22    Authorization Type Self pay    PT Start Time 1515    PT Stop Time 1610    PT Time Calculation (min) 55 min    Activity Tolerance Patient limited by pain    Behavior During Therapy [facility] for tasks assessed/performed             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    Pregnancy induced hypertension    Rh negative state in antepartum period 10/26/2017   [x]  rhogam 9/24   Severe preeclampsia, delivered 10/24/2017     Guidelines for Antenatal Testing and Sonography  (with updated ICD-10 codes)  Updated  12-Nov-2016 INDICATION U/S NST/AFI or BPP wkly DELIVERY       CHTN - O10.919  Group I   BP < 140/90, no preeclampsia, AGA,  nml AFV, +/- meds    Group II   BP > 140/90, on meds, no preeclampsia, AGA, nml AFV  20-28-34-38  20-24-28-32-35-38  32//2 x wk  28//BPP wkly then 32//2 x wk or BPP wkly  40 no meds; 39 med   Supervision of high risk pregnancy due to social problems 10/26/2017   Wanting to give up for adoption ([employer])   Supervision of high risk pregnancy, antepartum 10/10/2017    Nursing Staff Provider  Office Location  [place] Dating   third trimester  ultrasound  Language   English Anatomy US    Flu Vaccine  10/10/17 Genetic Screen  NIPS:   AFP:   First Screen:  Quad:    TDaP vaccine   10/10/17 Hgb A1C or  GTT Early  Third trimester ha1c 5.1  Rhogam   10-25-17   LAB RESULTS   Feeding Plan  Blood Type --/--/B NEG (09/28 1322) B negative  Contraception  Antibody POS (09/28 132   Past Surgical History:  Procedure Laterality Date   CESAREAN SECTION N/A 11/06/2017   Procedure: CESAREAN SECTION;  Surgeon: Sloan Leiter, MD;  Location: [facility];  Service: Obstetrics;  Laterality: N/A;   mirena     inserted 2019   Patient Active Problem List   Diagnosis Date Noted   Radiculopathy, cervical region 01/10/2022   Abnormal weight gain 09/22/2021   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   BMI 30s 10/26/2017   Hypothyroidism    Vitamin D deficiency 06/30/2015    PCP: Jeanie Sewer, NP   REFERRING PROVIDER: Marybelle Killings, MD   REFERRING DIAG: [phone] (ICD-10-CM) - Radiculopathy, cervical region, M25.511 (ICD-10-CM) - Acute pain of right shoulder  THERAPY DIAG:  Cervicalgia  Pain in right arm  Muscle weakness (generalized)  Abnormal posture  Rationale for Evaluation and Treatment: Rehabilitation  ONSET DATE: self-reported CTS a couple years, around October shoulder really started flaring up SUBJECTIVE:                                                                                                                                                                                                         SUBJECTIVE STATEMENT: Rory presented today with severe pain and irritability in her right shoulder and down her right arm. She describes her pain as a "heavy anvil on her shoulder" and reports that this pain is constant. She believes her pain is from doing a lot of overhead chopping at work and has not been able to work due to this pain recently. She reports no specific MOI that caused this pain to begin. She also reports a  stiffness after prolonged positions. She is not able to sleep much at night as it is hard to find any position of comfort. She has one child already and one on the way so her main concern is being able to care for her children and find a way to sleep more comfortably. Another main concern is the feeling and grip strength in her hand as she reports it feels constantly numb. She has a hard time holding onto anything without losing grip and opening objects as well.    PERTINENT HISTORY:  PMH: currently [redacted] weeks pregnant  PAIN:  Are you having pain? Yes: NPRS scale: 8-9/10 Pain location: Rt shoulder into the shoulder blade, down the arm, elbow,into the hand as well  Pain description: constant pain, numbness in hand, sometimes shooting down arm or into shoulder blade  Aggravating factors: any movement of the shoulder, especially any overhead activities, sleeping, prolonged sitting   Relieving factors: ibuprofen helped in the past (but unable to take currently due to pregnancy), rubbing a certain spot helps with a lot of pressure   PRECAUTIONS: None  WEIGHT BEARING RESTRICTIONS: No  FALLS:  Has patient fallen in last 6 months? 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, currently on medical leave  PLOF: Independent  PATIENT GOALS: strength back in hand, sleep better, back to normal   NEXT MD VISIT:   OBJECTIVE:   DIAGNOSTIC FINDINGS:  XR of shoulder and neck 11/17/21 has images in chart but no formal report available  PATIENT SURVEYS:  Eval: FOTO 42%  (GOAL 60%)  COGNITION: Overall cognitive status: Within functional limits for tasks assessed   POSTURE: rounded shoulders and forward head, R UE guarding  PALPATION: TTP over cervical region and scapula  CERVICAL ROM:   Active ROM A/PROM (deg) eval  Flexion 10  Extension 20  Right lateral flexion 20  Left lateral flexion 20  Right rotation 20  Left rotation 30   (Blank rows = not tested)  UPPER  EXTREMITY ROM:   AROM/PROM Right eval Left eval  Shoulder flexion ~80   Shoulder extension    Shoulder abduction    Shoulder adduction    Shoulder extension    Shoulder internal rotation    Shoulder external rotation    Elbow flexion    Elbow extension    Wrist flexion    Wrist extension    Wrist ulnar deviation    Wrist radial deviation    Wrist pronation    Wrist supination     (Blank rows = not tested)  UPPER EXTREMITY MMT:  MMT Right eval Left eval  Shoulder flexion 3-   Shoulder extension    Shoulder abduction 3-   Shoulder adduction    Shoulder extension    Shoulder internal rotation 4 5  Shoulder external rotation 4- 4+  Middle trapezius    Lower trapezius    Elbow flexion    Elbow extension    Wrist flexion    Wrist extension    Wrist ulnar deviation    Wrist radial deviation    Wrist pronation    Wrist supination    Grip strength     (Blank rows = not tested)  CERVICAL SPECIAL TESTS:  Spurlings: inconclusive  painful but not radicular Traction: positive ULTT Median Nerve: inconclusive painful but not radicular ULTT Radial Nerve: inconclusive painful but not radicular ULTT Ulnar Nerve: inconclusive painful but not radicular  FUNCTIONAL TESTS:  Eval: Grip strength: lt: 50.8#, rt: avg 8# on setting 2  TODAY'S TREATMENT:  Eval: 02/14/22 -Traction with towel 30 sec (tried with machine but caused discomfort)  -Seated cervical retraction x10 -Seated scapular retraction x5 HEP creation and review with demonstration and trial set preformed, see below for details   PATIENT EDUCATION: Education details: HEP, PT plan of care Person educated: Patient Education method: Explanation, Demonstration, Verbal cues, and Handouts Education comprehension: verbalized understanding and needs further education   HOME EXERCISE PROGRAM: Exercises  - Seated Cervical Retraction  - 2 x daily - 6 x weekly - 1-3 sets - 10 reps  - Seated Assisted Cervical Rotation  with Towel  - 2 x daily - 6 x weekly - 1 sets - 10 reps - 5 hold  - Seated Scapular Retraction  - 2 x daily - 6 x weekly - 3 sets - 10 reps  - Gripping Putty Supinated  - 2 x daily - 6 x weekly - 3 sets - 10 reps  - Seated Cervical Traction  - 2 x daily - 6 x weekly - 3-5 reps - 1 min hold hold  ASSESSMENT:  CLINICAL IMPRESSION: Patient referred to PT for cervical radiculopathy and right shoulder pain. Special testing was all painful but inconclusive for typical radicular patterns. Disc pathology cannot be ruled out and she may benefit from imaging in the future. Patient had high irritability of symptoms and showed apprehension to moving her shoulder and it being touched. She had complex pain symptoms and had significant strength and ROM deficits, particularly grip strength. Patient will benefit from skilled PT to address below impairments, limitations and improve overall function.  OBJECTIVE IMPAIRMENTS: decreased activity tolerance, decreased shoulder mobility, decreased ROM, decreased strength, impaired flexibility, impaired  UE use, postural dysfunction, and pain.  ACTIVITY LIMITATIONS: reaching, lifting, carry,  cleaning, driving, and or occupation  PERSONAL FACTORS:  pregnancy, chronic pain symptoms, also affecting patient's functional outcome.  REHAB POTENTIAL: Fair to Good  CLINICAL DECISION MAKING: Unstable/complicated  EVALUATION COMPLEXITY: High    GOALS: Short term PT Goals Target date: 03/15/2022   Pt will be I and compliant with HEP. Baseline:  Goal status: New Pt will decrease pain by 25% overall Baseline: Goal status: New  Long term PT goals Target date: 05/10/2022  Pt will improve Rt shoulder AROM to [facility] to improve functional reaching Baseline: Goal status: New Pt will improve  Rt shoulder strength to at least 4+/5 MMT to improve functional strength Baseline: Goal status: New Pt will improve FOTO to at least 60% functional to show improved  function Baseline: Goal status: New Pt will reduce pain to overall less than 4/10 with usual activity and work activity. Baseline: Goal status: New     5. Pt will be able to attain 25# of grip strength with her right hand.        Baseline:       Goal status: New   PLAN: PT FREQUENCY: 1-2 times per week   PT DURATION: 12 weeks  PLANNED INTERVENTIONS (unless contraindicated): aquatic PT, Canalith repositioning, cryotherapy, Electrical stimulation, Iontophoresis with 4 mg/ml dexamethasome, Moist heat, traction, Ultrasound, gait training, Therapeutic exercise, balance training, neuromuscular re-education, patient/family education, prosthetic training, manual techniques, passive ROM, dry needling, taping, vasopnuematic device, vestibular, spinal manipulations, joint manipulations  PLAN FOR NEXT SESSION: review HEP, manual traction with towel, try some STM of cupping, possible nerve carpal tunnel glides if tolerated, gentle PROM    Artina Minella, Student-PT 02/15/2022, 8:46 AM  PHYSICAL THERAPY DISCHARGE SUMMARY  Visits from Start of Care: 02/14/2022  Current functional level related to goals / functional outcomes: N/A pt never returned following eval.    Remaining deficits: N/A pt never returned following eval.    Education / Equipment: N/A pt never returned following eval.    Patient agrees to discharge. Patient goals were not met. Patient is being discharged due to not returning since the last visit.

## 2022-02-15 ENCOUNTER — Encounter: Payer: Self-pay | Admitting: Physical Therapy

## 2022-02-15 NOTE — Addendum Note (Signed)
Addended by: Debbe Odea on: 02/15/2022 09:40 AM   Modules accepted: Orders

## 2022-02-16 ENCOUNTER — Ambulatory Visit: Payer: Commercial Managed Care - HMO

## 2022-02-17 ENCOUNTER — Telehealth: Payer: Self-pay | Admitting: Gastroenterology

## 2022-02-17 NOTE — Telephone Encounter (Signed)
Medical records office visit notes for 09/20/2021 were sent to [facility] fax [phone] was confirmed by site supervisior [phone]

## 2022-02-18 ENCOUNTER — Encounter (INDEPENDENT_AMBULATORY_CARE_PROVIDER_SITE_OTHER): Payer: Self-pay | Admitting: Pediatrics

## 2022-03-03 LAB — OB RESULTS CONSOLE RUBELLA ANTIBODY, IGM: Rubella: IMMUNE

## 2022-03-03 LAB — OB RESULTS CONSOLE ANTIBODY SCREEN: Antibody Screen: POSITIVE

## 2022-03-03 LAB — OB RESULTS CONSOLE RPR: RPR: NONREACTIVE

## 2022-03-03 LAB — OB RESULTS CONSOLE ABO/RH: RH Type: NEGATIVE

## 2022-03-03 LAB — OB RESULTS CONSOLE HIV ANTIBODY (ROUTINE TESTING): HIV: NONREACTIVE

## 2022-03-03 LAB — HEPATITIS C ANTIBODY: HCV Ab: NEGATIVE

## 2022-03-03 LAB — OB RESULTS CONSOLE HEPATITIS B SURFACE ANTIGEN: Hepatitis B Surface Ag: NEGATIVE

## 2022-03-07 ENCOUNTER — Ambulatory Visit: Payer: Commercial Managed Care - HMO | Admitting: *Deleted

## 2022-03-07 ENCOUNTER — Encounter: Payer: Self-pay | Admitting: *Deleted

## 2022-03-07 ENCOUNTER — Other Ambulatory Visit: Payer: Self-pay | Admitting: Obstetrics and Gynecology

## 2022-03-07 ENCOUNTER — Ambulatory Visit: Payer: Commercial Managed Care - HMO | Attending: Obstetrics and Gynecology

## 2022-03-07 ENCOUNTER — Ambulatory Visit (HOSPITAL_BASED_OUTPATIENT_CLINIC_OR_DEPARTMENT_OTHER): Payer: Commercial Managed Care - HMO | Admitting: Maternal & Fetal Medicine

## 2022-03-07 ENCOUNTER — Telehealth: Payer: Self-pay | Admitting: Orthopaedic Surgery

## 2022-03-07 ENCOUNTER — Other Ambulatory Visit: Payer: Self-pay | Admitting: *Deleted

## 2022-03-07 ENCOUNTER — Ambulatory Visit: Payer: Commercial Managed Care - HMO

## 2022-03-07 VITALS — BP 135/72 | HR 102

## 2022-03-07 DIAGNOSIS — Q958 Other balanced rearrangements and structural markers: Secondary | ICD-10-CM

## 2022-03-07 DIAGNOSIS — Z87798 Personal history of other (corrected) congenital malformations: Secondary | ICD-10-CM

## 2022-03-07 DIAGNOSIS — O34219 Maternal care for unspecified type scar from previous cesarean delivery: Secondary | ICD-10-CM

## 2022-03-07 DIAGNOSIS — Z3689 Encounter for other specified antenatal screening: Secondary | ICD-10-CM | POA: Insufficient documentation

## 2022-03-07 DIAGNOSIS — O285 Abnormal chromosomal and genetic finding on antenatal screening of mother: Secondary | ICD-10-CM | POA: Diagnosis not present

## 2022-03-07 DIAGNOSIS — Z3A15 15 weeks gestation of pregnancy: Secondary | ICD-10-CM

## 2022-03-07 DIAGNOSIS — O280 Abnormal hematological finding on antenatal screening of mother: Secondary | ICD-10-CM

## 2022-03-07 DIAGNOSIS — O99212 Obesity complicating pregnancy, second trimester: Secondary | ICD-10-CM

## 2022-03-07 DIAGNOSIS — O09292 Supervision of pregnancy with other poor reproductive or obstetric history, second trimester: Secondary | ICD-10-CM

## 2022-03-07 DIAGNOSIS — O09892 Supervision of other high risk pregnancies, second trimester: Secondary | ICD-10-CM

## 2022-03-07 NOTE — Progress Notes (Signed)
Ms. 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 After discussion and counseling with Dr. Epimenio Sarin, Jill Evans declined formal genetic counseling.

## 2022-03-07 NOTE — Telephone Encounter (Signed)
Please advise. OK for note? Any restrictions?

## 2022-03-07 NOTE — Progress Notes (Unsigned)
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  Per the report the region that is deleted does not include the genetic region of chromosome 13 responsible for retinoblastoma.  There is a 50% chance the child may inherit the chromosome 13 deletion.  The patient has declined diagnostic testing via amniocentesis and would not terminate either way.  She is also not had aneuploidy screening.  We discussed that cell free DNA would likely trigger a false positive due to maternal cell abnormalities with trisomy 13.  Therefore, if she would like aneuploidy screening we recommend a quad screen and genetic ultrasound.  She will follow-up in 4 weeks for a detailed ultrasound.  Recommend peds genetics to evaluate the newborn after birth.  She has no further questions and declines further genetic counseling.  EDD: 08/24/2022. Dating: Early Ultrasound  (01/05/22).  Other pregnancy complications: personal history of  Aneuplolidy screening: none AFP: n/a  Sonographic findings Single intrauterine pregnancy. Fetal cardiac activity:  Observed and appears normal. Presentation: Variable. The basic anatomic survey does not show any major abnormalities but is limited by the early gestational age. Fetal biometry matches dates Amniotic fluid volume: Within normal limits. MVP: 6.34 cm. Placenta: Posterior Fundal.  Vitals and Physical Exam 135/72, pulse 102 Sitting comfortably on the sonogram table Nonlabored breathing Normal rate and rhythm Abdomen is non-tender  Review of Systems: A review of systems was performed and was negative except per HPI    Recommendations - Follow up ultrasound for detailed anatomic survey in 4 weeks - Declines amnio - Avoid cell free DNA platform for aneuploidy screening due to high chace of false positive. Recommend quad screen instead   I spent 30 minutes reviewing the patients chart, including labs and images as well as counseling the patient about her medical conditions.  Jill Evans  MFM, [place]   03/07/2022  2:04 PM

## 2022-03-07 NOTE — Telephone Encounter (Signed)
Patient want a note to return to work  and her restrictions..please call patient.Marland Kitchen

## 2022-03-07 NOTE — Telephone Encounter (Signed)
Patient would like dates included in note with restrictions (with pounds and for wrist and shoulder specifically) she started medical on 11/10/2021 and was first seen in office on 11/17/21. Please advise, please be as detailed as possible being this is a work note

## 2022-03-08 ENCOUNTER — Encounter: Payer: Commercial Managed Care - HMO | Admitting: Physical Therapy

## 2022-03-08 LAB — OB RESULTS CONSOLE GC/CHLAMYDIA
Chlamydia: NEGATIVE
Neisseria Gonorrhea: NEGATIVE

## 2022-03-08 NOTE — Telephone Encounter (Signed)
Note entered and placed at front desk for patient to pick up. Dates out of work added to note. Patient advised, note at front for pick up.

## 2022-03-11 ENCOUNTER — Other Ambulatory Visit: Payer: Self-pay

## 2022-03-24 ENCOUNTER — Ambulatory Visit: Payer: Commercial Managed Care - HMO

## 2022-03-24 ENCOUNTER — Encounter: Payer: Self-pay | Admitting: Family

## 2022-04-04 ENCOUNTER — Other Ambulatory Visit: Payer: Self-pay

## 2022-04-05 ENCOUNTER — Ambulatory Visit: Payer: Commercial Managed Care - HMO | Admitting: *Deleted

## 2022-04-05 ENCOUNTER — Ambulatory Visit: Payer: Commercial Managed Care - HMO | Attending: Maternal & Fetal Medicine

## 2022-04-05 ENCOUNTER — Other Ambulatory Visit: Payer: Self-pay | Admitting: Maternal & Fetal Medicine

## 2022-04-05 ENCOUNTER — Encounter: Payer: Self-pay | Admitting: *Deleted

## 2022-04-05 ENCOUNTER — Other Ambulatory Visit: Payer: Self-pay

## 2022-04-05 DIAGNOSIS — O280 Abnormal hematological finding on antenatal screening of mother: Secondary | ICD-10-CM | POA: Insufficient documentation

## 2022-04-05 DIAGNOSIS — O09292 Supervision of pregnancy with other poor reproductive or obstetric history, second trimester: Secondary | ICD-10-CM | POA: Diagnosis present

## 2022-04-05 DIAGNOSIS — O99212 Obesity complicating pregnancy, second trimester: Secondary | ICD-10-CM | POA: Diagnosis present

## 2022-04-05 DIAGNOSIS — Q9359 Other deletions of part of a chromosome: Secondary | ICD-10-CM | POA: Diagnosis not present

## 2022-04-05 DIAGNOSIS — O34219 Maternal care for unspecified type scar from previous cesarean delivery: Secondary | ICD-10-CM | POA: Insufficient documentation

## 2022-04-05 DIAGNOSIS — O99891 Other specified diseases and conditions complicating pregnancy: Secondary | ICD-10-CM | POA: Diagnosis not present

## 2022-04-05 DIAGNOSIS — E669 Obesity, unspecified: Secondary | ICD-10-CM

## 2022-04-05 DIAGNOSIS — Z3A19 19 weeks gestation of pregnancy: Secondary | ICD-10-CM

## 2022-04-05 DIAGNOSIS — O09892 Supervision of other high risk pregnancies, second trimester: Secondary | ICD-10-CM | POA: Insufficient documentation

## 2022-04-06 ENCOUNTER — Other Ambulatory Visit: Payer: Self-pay | Admitting: *Deleted

## 2022-04-06 DIAGNOSIS — O09299 Supervision of pregnancy with other poor reproductive or obstetric history, unspecified trimester: Secondary | ICD-10-CM

## 2022-04-06 DIAGNOSIS — O99212 Obesity complicating pregnancy, second trimester: Secondary | ICD-10-CM

## 2022-04-06 DIAGNOSIS — Q9359 Other deletions of part of a chromosome: Secondary | ICD-10-CM

## 2022-05-03 ENCOUNTER — Other Ambulatory Visit: Payer: Self-pay

## 2022-05-04 ENCOUNTER — Ambulatory Visit (HOSPITAL_BASED_OUTPATIENT_CLINIC_OR_DEPARTMENT_OTHER): Payer: Commercial Managed Care - HMO | Admitting: Obstetrics

## 2022-05-04 ENCOUNTER — Other Ambulatory Visit: Payer: Self-pay | Admitting: Obstetrics

## 2022-05-04 ENCOUNTER — Ambulatory Visit: Payer: Commercial Managed Care - HMO | Attending: Obstetrics | Admitting: *Deleted

## 2022-05-04 ENCOUNTER — Ambulatory Visit (HOSPITAL_BASED_OUTPATIENT_CLINIC_OR_DEPARTMENT_OTHER): Payer: Commercial Managed Care - HMO

## 2022-05-04 ENCOUNTER — Other Ambulatory Visit: Payer: Self-pay | Admitting: *Deleted

## 2022-05-04 VITALS — BP 112/92 | HR 112

## 2022-05-04 DIAGNOSIS — O3519X9 Maternal care for (suspected) chromosomal abnormality in fetus, other chromosomal abnormality, other fetus: Secondary | ICD-10-CM

## 2022-05-04 DIAGNOSIS — O09299 Supervision of pregnancy with other poor reproductive or obstetric history, unspecified trimester: Secondary | ICD-10-CM

## 2022-05-04 DIAGNOSIS — O361120 Maternal care for Anti-A sensitization, second trimester, not applicable or unspecified: Secondary | ICD-10-CM | POA: Diagnosis not present

## 2022-05-04 DIAGNOSIS — Z3A24 24 weeks gestation of pregnancy: Secondary | ICD-10-CM | POA: Insufficient documentation

## 2022-05-04 DIAGNOSIS — O99212 Obesity complicating pregnancy, second trimester: Secondary | ICD-10-CM

## 2022-05-04 DIAGNOSIS — E669 Obesity, unspecified: Secondary | ICD-10-CM

## 2022-05-04 DIAGNOSIS — O360120 Maternal care for anti-D [Rh] antibodies, second trimester, not applicable or unspecified: Secondary | ICD-10-CM | POA: Diagnosis not present

## 2022-05-04 DIAGNOSIS — O09292 Supervision of pregnancy with other poor reproductive or obstetric history, second trimester: Secondary | ICD-10-CM | POA: Insufficient documentation

## 2022-05-04 DIAGNOSIS — O289 Unspecified abnormal findings on antenatal screening of mother: Secondary | ICD-10-CM | POA: Diagnosis not present

## 2022-05-04 DIAGNOSIS — Z362 Encounter for other antenatal screening follow-up: Secondary | ICD-10-CM | POA: Diagnosis not present

## 2022-05-04 DIAGNOSIS — Q9359 Other deletions of part of a chromosome: Secondary | ICD-10-CM

## 2022-05-04 DIAGNOSIS — O3515X1 Maternal care for (suspected) chromosomal abnormality in fetus, sex chromosome abnormality, fetus 1: Secondary | ICD-10-CM

## 2022-05-04 DIAGNOSIS — O99891 Other specified diseases and conditions complicating pregnancy: Secondary | ICD-10-CM

## 2022-05-04 DIAGNOSIS — O09892 Supervision of other high risk pregnancies, second trimester: Secondary | ICD-10-CM

## 2022-05-04 NOTE — Progress Notes (Signed)
MFM Note  Jill Evans was seen for a follow-up exam due to a maternal partial chromosome 13 deletion at 62 weeks.    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  Her anti-D antibody titer levels increased from 1:2 last month to 1:32 this month.    She denies any problems since her last exam.  She was informed that the fetal growth and amniotic fluid level appears appropriate for her gestational age.  The views of the fetal anatomy remain limited today due to the fetal position.  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    She was advised that she most likely was exposed to the D antigen from her prior child.    She was advised that should the fetus that she is carrying also have the D antigen on its red blood cells (inherited from the father), that the anti-D antibodies in her blood may cross over the placenta and cause fetal anemia.    She was reassured that the peak systolic velocity of the middle cerebral artery (MCA) measured today was less than 1.5 multiple of the median (MoM) for her gestational age, indicating that her fetus is not anemic at this time.   There were no signs of fetal hydrops noted on today's exam.   As her anti-D antibody titer levels have now reached a critical titer of 1:32, we will continue to follow her with MCA Doppler measurements every 2 weeks to screen for fetal anemia.  We may increase the frequency of exams to weekly should there be a high suspicion for fetal anemia.  Should the peak systolic velocity of the MCA Dopplers reach 1.5 MoM's or higher, either delivery or an intrauterine fetal blood transfusion may be necessary.  Weekly fetal testing is recommended starting at 32 weeks.  Should the MCA Doppler studies continue to be less than 1.5 MoM's for her gestational age, delivery is recommended at  between 33 to 31 weeks.    As the antibodies may persist in the baby's circulation after birth, her baby should continue to be observed for anemia and hemolysis/jaundice after birth.  She was advised to notify her pediatrician regarding isoimmunization during pregnancy.  Another MCA Doppler study was scheduled in 2 weeks.  The availability of the [employer] cell free DNA test to determine the fetal blood type was discussed.  She declined this test today as the results of the test would not change the management of her pregnancy.  At the end of the consultation, the patient stated that all her questions have been answered to her complete satisfaction.  A total of 30 minutes was spent counseling and coordinating the care for this patient.  Greater than 50% of the time was spent in direct face-to-face contact.

## 2022-05-05 ENCOUNTER — Ambulatory Visit: Payer: Commercial Managed Care - HMO

## 2022-05-17 ENCOUNTER — Ambulatory Visit: Payer: Commercial Managed Care - HMO | Attending: Obstetrics

## 2022-05-17 ENCOUNTER — Other Ambulatory Visit: Payer: Self-pay | Admitting: *Deleted

## 2022-05-17 ENCOUNTER — Ambulatory Visit: Payer: Commercial Managed Care - HMO | Admitting: *Deleted

## 2022-05-17 VITALS — BP 128/81 | HR 104

## 2022-05-17 DIAGNOSIS — O360920 Maternal care for other rhesus isoimmunization, second trimester, not applicable or unspecified: Secondary | ICD-10-CM

## 2022-05-17 DIAGNOSIS — O99891 Other specified diseases and conditions complicating pregnancy: Secondary | ICD-10-CM | POA: Diagnosis not present

## 2022-05-17 DIAGNOSIS — O3519X0 Maternal care for (suspected) chromosomal abnormality in fetus, other chromosomal abnormality, not applicable or unspecified: Secondary | ICD-10-CM | POA: Diagnosis not present

## 2022-05-17 DIAGNOSIS — E669 Obesity, unspecified: Secondary | ICD-10-CM

## 2022-05-17 DIAGNOSIS — O09292 Supervision of pregnancy with other poor reproductive or obstetric history, second trimester: Secondary | ICD-10-CM

## 2022-05-17 DIAGNOSIS — Z3A25 25 weeks gestation of pregnancy: Secondary | ICD-10-CM

## 2022-05-17 DIAGNOSIS — O99212 Obesity complicating pregnancy, second trimester: Secondary | ICD-10-CM

## 2022-05-17 DIAGNOSIS — O360120 Maternal care for anti-D [Rh] antibodies, second trimester, not applicable or unspecified: Secondary | ICD-10-CM | POA: Diagnosis not present

## 2022-05-17 DIAGNOSIS — Q998 Other specified chromosome abnormalities: Secondary | ICD-10-CM

## 2022-05-17 DIAGNOSIS — Q9359 Other deletions of part of a chromosome: Secondary | ICD-10-CM | POA: Diagnosis not present

## 2022-05-17 DIAGNOSIS — O3515X1 Maternal care for (suspected) chromosomal abnormality in fetus, sex chromosome abnormality, fetus 1: Secondary | ICD-10-CM

## 2022-05-20 ENCOUNTER — Other Ambulatory Visit: Payer: Self-pay | Admitting: *Deleted

## 2022-05-20 DIAGNOSIS — O09299 Supervision of pregnancy with other poor reproductive or obstetric history, unspecified trimester: Secondary | ICD-10-CM

## 2022-05-20 DIAGNOSIS — O99212 Obesity complicating pregnancy, second trimester: Secondary | ICD-10-CM

## 2022-05-20 DIAGNOSIS — Z3182 Encounter for Rh incompatibility status: Secondary | ICD-10-CM

## 2022-05-20 DIAGNOSIS — O280 Abnormal hematological finding on antenatal screening of mother: Secondary | ICD-10-CM

## 2022-05-30 DIAGNOSIS — O34219 Maternal care for unspecified type scar from previous cesarean delivery: Secondary | ICD-10-CM

## 2022-06-01 ENCOUNTER — Other Ambulatory Visit: Payer: Self-pay

## 2022-06-01 ENCOUNTER — Ambulatory Visit: Payer: Commercial Managed Care - HMO | Admitting: *Deleted

## 2022-06-01 ENCOUNTER — Ambulatory Visit: Payer: Commercial Managed Care - HMO | Attending: Obstetrics

## 2022-06-01 ENCOUNTER — Other Ambulatory Visit: Payer: Self-pay | Admitting: Obstetrics and Gynecology

## 2022-06-01 VITALS — BP 126/85 | HR 99

## 2022-06-01 DIAGNOSIS — O99212 Obesity complicating pregnancy, second trimester: Secondary | ICD-10-CM | POA: Diagnosis present

## 2022-06-01 DIAGNOSIS — Q998 Other specified chromosome abnormalities: Secondary | ICD-10-CM

## 2022-06-01 DIAGNOSIS — O360920 Maternal care for other rhesus isoimmunization, second trimester, not applicable or unspecified: Secondary | ICD-10-CM

## 2022-06-01 DIAGNOSIS — O99891 Other specified diseases and conditions complicating pregnancy: Secondary | ICD-10-CM | POA: Diagnosis not present

## 2022-06-01 DIAGNOSIS — O34219 Maternal care for unspecified type scar from previous cesarean delivery: Secondary | ICD-10-CM | POA: Diagnosis present

## 2022-06-01 DIAGNOSIS — O09292 Supervision of pregnancy with other poor reproductive or obstetric history, second trimester: Secondary | ICD-10-CM

## 2022-06-01 DIAGNOSIS — Q9359 Other deletions of part of a chromosome: Secondary | ICD-10-CM

## 2022-06-01 DIAGNOSIS — O360130 Maternal care for anti-D [Rh] antibodies, third trimester, not applicable or unspecified: Secondary | ICD-10-CM | POA: Diagnosis not present

## 2022-06-01 DIAGNOSIS — E669 Obesity, unspecified: Secondary | ICD-10-CM

## 2022-06-01 DIAGNOSIS — O09293 Supervision of pregnancy with other poor reproductive or obstetric history, third trimester: Secondary | ICD-10-CM

## 2022-06-01 DIAGNOSIS — Z3A28 28 weeks gestation of pregnancy: Secondary | ICD-10-CM

## 2022-06-01 DIAGNOSIS — O3519X0 Maternal care for (suspected) chromosomal abnormality in fetus, other chromosomal abnormality, not applicable or unspecified: Secondary | ICD-10-CM | POA: Diagnosis not present

## 2022-06-01 DIAGNOSIS — O3515X1 Maternal care for (suspected) chromosomal abnormality in fetus, sex chromosome abnormality, fetus 1: Secondary | ICD-10-CM | POA: Insufficient documentation

## 2022-06-01 DIAGNOSIS — O99213 Obesity complicating pregnancy, third trimester: Secondary | ICD-10-CM

## 2022-06-14 ENCOUNTER — Ambulatory Visit: Payer: Commercial Managed Care - HMO | Attending: Obstetrics and Gynecology

## 2022-06-14 ENCOUNTER — Encounter: Payer: Self-pay | Admitting: *Deleted

## 2022-06-14 ENCOUNTER — Ambulatory Visit: Payer: Commercial Managed Care - HMO | Admitting: *Deleted

## 2022-06-14 DIAGNOSIS — Z3182 Encounter for Rh incompatibility status: Secondary | ICD-10-CM | POA: Diagnosis present

## 2022-06-14 DIAGNOSIS — E669 Obesity, unspecified: Secondary | ICD-10-CM

## 2022-06-14 DIAGNOSIS — O360920 Maternal care for other rhesus isoimmunization, second trimester, not applicable or unspecified: Secondary | ICD-10-CM | POA: Diagnosis not present

## 2022-06-14 DIAGNOSIS — O09299 Supervision of pregnancy with other poor reproductive or obstetric history, unspecified trimester: Secondary | ICD-10-CM | POA: Diagnosis present

## 2022-06-14 DIAGNOSIS — O09292 Supervision of pregnancy with other poor reproductive or obstetric history, second trimester: Secondary | ICD-10-CM | POA: Diagnosis not present

## 2022-06-14 DIAGNOSIS — O280 Abnormal hematological finding on antenatal screening of mother: Secondary | ICD-10-CM | POA: Diagnosis not present

## 2022-06-14 DIAGNOSIS — O99212 Obesity complicating pregnancy, second trimester: Secondary | ICD-10-CM

## 2022-06-14 DIAGNOSIS — Z3A29 29 weeks gestation of pregnancy: Secondary | ICD-10-CM

## 2022-06-14 DIAGNOSIS — Q998 Other specified chromosome abnormalities: Secondary | ICD-10-CM | POA: Insufficient documentation

## 2022-06-21 ENCOUNTER — Ambulatory Visit: Payer: Commercial Managed Care - HMO | Admitting: *Deleted

## 2022-06-21 ENCOUNTER — Ambulatory Visit (HOSPITAL_BASED_OUTPATIENT_CLINIC_OR_DEPARTMENT_OTHER): Payer: Commercial Managed Care - HMO | Admitting: Maternal & Fetal Medicine

## 2022-06-21 ENCOUNTER — Encounter: Payer: Self-pay | Admitting: *Deleted

## 2022-06-21 ENCOUNTER — Ambulatory Visit: Payer: Commercial Managed Care - HMO | Attending: Obstetrics and Gynecology

## 2022-06-21 DIAGNOSIS — E669 Obesity, unspecified: Secondary | ICD-10-CM

## 2022-06-21 DIAGNOSIS — O361930 Maternal care for other isoimmunization, third trimester, not applicable or unspecified: Secondary | ICD-10-CM

## 2022-06-21 DIAGNOSIS — Z3A30 30 weeks gestation of pregnancy: Secondary | ICD-10-CM | POA: Insufficient documentation

## 2022-06-21 DIAGNOSIS — O09292 Supervision of pregnancy with other poor reproductive or obstetric history, second trimester: Secondary | ICD-10-CM | POA: Diagnosis present

## 2022-06-21 DIAGNOSIS — O360920 Maternal care for other rhesus isoimmunization, second trimester, not applicable or unspecified: Secondary | ICD-10-CM | POA: Insufficient documentation

## 2022-06-21 DIAGNOSIS — O285 Abnormal chromosomal and genetic finding on antenatal screening of mother: Secondary | ICD-10-CM | POA: Diagnosis not present

## 2022-06-21 DIAGNOSIS — O99212 Obesity complicating pregnancy, second trimester: Secondary | ICD-10-CM | POA: Diagnosis present

## 2022-06-21 DIAGNOSIS — O283 Abnormal ultrasonic finding on antenatal screening of mother: Secondary | ICD-10-CM | POA: Insufficient documentation

## 2022-06-21 DIAGNOSIS — Q998 Other specified chromosome abnormalities: Secondary | ICD-10-CM | POA: Insufficient documentation

## 2022-06-21 DIAGNOSIS — O09293 Supervision of pregnancy with other poor reproductive or obstetric history, third trimester: Secondary | ICD-10-CM | POA: Diagnosis not present

## 2022-06-21 HISTORY — DX: Maternal care for other isoimmunization, third trimester, not applicable or unspecified: O36.1930

## 2022-06-21 NOTE — Progress Notes (Signed)
Patient information  Patient Name: Jill Evans  Patient MRN:   [id number]  Referring practice: MFM Referring Provider: [facility]  MFM CONSULT  Jill Evans is a 32 y.o. G2P0101 at [redacted]w[redacted]d here for ultrasound and consultation.  She is dated by a 7-week 0-day ultrasound.  Her due date is 08/20/2022.  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  She has alloimmunization with anti-D antibodies.  Her titer increased from 1-2 up to 1 and 32.  MCA Dopplers are 1.44 last week.  Today they are 1.62 and there is new onset fetal ascites.  There is no other signs of fetal hydrops.  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  Per the report from her geneticist in 2001, the region that is deleted does not include the genetic region of chromosome 13 responsible for retinoblastoma.  There is a 50% chance the child may inherit the chromosome 13 deletion.  The patient has declined diagnostic testing via amniocentesis and would not terminate either way.  She is also not had aneuploidy screening.  She had a panorama testing for aneuploidy screening and her results were negative for aneuploidy however, it identified the maternal gene deletion on chromosome 13 and was flagged as abnormal.   Despite the fetal abdomen measuring in the 90th percentile she passed her Glucola screening.  Sonographic findings Single intrauterine pregnancy. Fetal cardiac activity:  Observed and appears normal. Presentation: Breech. Interval fetal anatomy appears normal except for new onset fetal ascites.  There is no other signs of fetal hydrops. Fetal biometry shows the estimated fetal weight at the 87 percentile and the abdominal circumference at the 99th  percentile.  Amniotic fluid volume: Within normal limits. MVP: 6.17 cm. Placenta: Posterior Fundal. MCA Dopplers were 1.62 multiples of the mean, up from 1.44. BPP 8/8.   Assessment 1. 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 [redacted] weeks gestation of pregnancy 3. Maternal obesity  4. 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  She is aware that this is a sign of worsening fetal anemia with increased risk of stillbirth and fetal hypoxia and need for transfusion.  I discussed that the biophysical profile today does appear to be an 8 out of 8 but that she needs to go to either [facility] or [facility] for admission for possible intrauterine fetal transfusion.  I discussed that it is important to go straight to the hospital of their choice after this visit to allow sufficient time for type and cross for the fetal transfusion.  Her partner and her will decide on which place will be best for them based on family support as well as insurance.  I have notified both [facility] as well as [facility] to anticipate her arrival.  Review of Systems: A review of systems was performed and was negative except per HPI   Vitals and Physical Exam    06/21/2022    3:49 PM 06/21/2022    3:44 PM 06/14/2022    1:53 PM  Vitals with BMI  Systolic 127 132 409  Diastolic 82 86 79  Pulse 92  105  Sitting comfortably on the sonogram table Nonlabored breathing Normal rate and rhythm Abdomen is nontender  Past pregnancies OB History  Gravida Para Term Preterm AB Living  2 1   1   1   SAB IAB Ectopic Multiple Live Births        0 1    # Outcome Date GA Lbr Len/2nd Weight Sex Delivery Anes PTL Lv  2 Current           1 Preterm 11/06/17 [redacted]w[redacted]d  4 lb 4.4 oz (1.94 kg) M CS-LTranv EPI  LIV     I spent 45 minutes reviewing the  patients chart, including labs and images as well as counseling the patient about her medical conditions and attempting to arrange transfer to the appropriate facilty. Greater than 50% of the time was spent in direct face-to-face patient counseling.  [name]  MFM, [facility]   06/21/2022  5:33 PM

## 2022-06-22 DIAGNOSIS — E039 Hypothyroidism, unspecified: Secondary | ICD-10-CM | POA: Diagnosis present

## 2022-06-23 DIAGNOSIS — O26643 Intrahepatic cholestasis of pregnancy, third trimester: Secondary | ICD-10-CM | POA: Insufficient documentation

## 2022-06-24 ENCOUNTER — Other Ambulatory Visit: Payer: Self-pay | Admitting: *Deleted

## 2022-06-24 DIAGNOSIS — O360990 Maternal care for other rhesus isoimmunization, unspecified trimester, not applicable or unspecified: Secondary | ICD-10-CM

## 2022-06-28 ENCOUNTER — Other Ambulatory Visit: Payer: Self-pay | Admitting: Maternal & Fetal Medicine

## 2022-06-28 ENCOUNTER — Ambulatory Visit: Payer: Commercial Managed Care - HMO

## 2022-06-28 ENCOUNTER — Ambulatory Visit (HOSPITAL_BASED_OUTPATIENT_CLINIC_OR_DEPARTMENT_OTHER): Payer: Commercial Managed Care - HMO | Admitting: *Deleted

## 2022-06-28 ENCOUNTER — Ambulatory Visit: Payer: Commercial Managed Care - HMO | Attending: Maternal & Fetal Medicine

## 2022-06-28 ENCOUNTER — Encounter: Payer: Self-pay | Admitting: *Deleted

## 2022-06-28 ENCOUNTER — Ambulatory Visit: Payer: Commercial Managed Care - HMO | Admitting: *Deleted

## 2022-06-28 DIAGNOSIS — Z3A31 31 weeks gestation of pregnancy: Secondary | ICD-10-CM

## 2022-06-28 DIAGNOSIS — O99213 Obesity complicating pregnancy, third trimester: Secondary | ICD-10-CM | POA: Diagnosis not present

## 2022-06-28 DIAGNOSIS — O360930 Maternal care for other rhesus isoimmunization, third trimester, not applicable or unspecified: Secondary | ICD-10-CM | POA: Insufficient documentation

## 2022-06-28 DIAGNOSIS — O360990 Maternal care for other rhesus isoimmunization, unspecified trimester, not applicable or unspecified: Secondary | ICD-10-CM

## 2022-06-28 DIAGNOSIS — O3519X0 Maternal care for (suspected) chromosomal abnormality in fetus, other chromosomal abnormality, not applicable or unspecified: Secondary | ICD-10-CM | POA: Diagnosis not present

## 2022-06-28 DIAGNOSIS — E669 Obesity, unspecified: Secondary | ICD-10-CM

## 2022-06-28 DIAGNOSIS — O26643 Intrahepatic cholestasis of pregnancy, third trimester: Secondary | ICD-10-CM | POA: Insufficient documentation

## 2022-06-28 DIAGNOSIS — O09293 Supervision of pregnancy with other poor reproductive or obstetric history, third trimester: Secondary | ICD-10-CM | POA: Diagnosis not present

## 2022-06-28 DIAGNOSIS — Z87798 Personal history of other (corrected) congenital malformations: Secondary | ICD-10-CM

## 2022-06-28 NOTE — Procedures (Signed)
Jill Evans 20-Aug-1990 [redacted]w[redacted]d  Fetus A Non-Stress Test Interpretation for 06/28/22  Indication:  Cholestasis, fetal anemia, fetal transfusion  Fetal Heart Rate A Mode: External Baseline Rate (A): 140 bpm Variability: Moderate Accelerations: 15 x 15 Decelerations: None Multiple birth?: No  Uterine Activity Mode: Palpation, Toco Contraction Frequency (min): None  Interpretation (Fetal Testing) Nonstress Test Interpretation: Reactive Comments: Dr. Judeth Cornfield reviewed tracing.

## 2022-06-29 ENCOUNTER — Other Ambulatory Visit: Payer: Self-pay | Admitting: *Deleted

## 2022-06-29 DIAGNOSIS — O361130 Maternal care for Anti-A sensitization, third trimester, not applicable or unspecified: Secondary | ICD-10-CM

## 2022-06-30 ENCOUNTER — Ambulatory Visit: Payer: Commercial Managed Care - HMO

## 2022-07-01 ENCOUNTER — Other Ambulatory Visit: Payer: Commercial Managed Care - HMO

## 2022-07-01 ENCOUNTER — Ambulatory Visit: Payer: Commercial Managed Care - HMO

## 2022-07-08 ENCOUNTER — Ambulatory Visit: Payer: Commercial Managed Care - HMO | Admitting: *Deleted

## 2022-07-08 ENCOUNTER — Ambulatory Visit: Payer: Commercial Managed Care - HMO | Attending: Obstetrics and Gynecology

## 2022-07-08 VITALS — BP 136/84 | HR 85

## 2022-07-08 DIAGNOSIS — O3519X0 Maternal care for (suspected) chromosomal abnormality in fetus, other chromosomal abnormality, not applicable or unspecified: Secondary | ICD-10-CM | POA: Diagnosis not present

## 2022-07-08 DIAGNOSIS — O34219 Maternal care for unspecified type scar from previous cesarean delivery: Secondary | ICD-10-CM | POA: Diagnosis present

## 2022-07-08 DIAGNOSIS — E669 Obesity, unspecified: Secondary | ICD-10-CM

## 2022-07-08 DIAGNOSIS — O360190 Maternal care for anti-D [Rh] antibodies, unspecified trimester, not applicable or unspecified: Secondary | ICD-10-CM | POA: Diagnosis not present

## 2022-07-08 DIAGNOSIS — O361130 Maternal care for Anti-A sensitization, third trimester, not applicable or unspecified: Secondary | ICD-10-CM | POA: Diagnosis present

## 2022-07-08 DIAGNOSIS — Z3A33 33 weeks gestation of pregnancy: Secondary | ICD-10-CM

## 2022-07-08 DIAGNOSIS — O99213 Obesity complicating pregnancy, third trimester: Secondary | ICD-10-CM

## 2022-07-08 DIAGNOSIS — O09293 Supervision of pregnancy with other poor reproductive or obstetric history, third trimester: Secondary | ICD-10-CM

## 2022-07-10 DIAGNOSIS — R7689 Other specified abnormal immunological findings in serum: Secondary | ICD-10-CM | POA: Insufficient documentation

## 2022-07-13 ENCOUNTER — Encounter: Payer: Self-pay | Admitting: *Deleted

## 2022-07-15 ENCOUNTER — Ambulatory Visit: Payer: Commercial Managed Care - HMO | Admitting: *Deleted

## 2022-07-15 ENCOUNTER — Ambulatory Visit: Payer: Commercial Managed Care - HMO | Attending: Obstetrics and Gynecology

## 2022-07-15 ENCOUNTER — Encounter (HOSPITAL_COMMUNITY): Payer: Self-pay

## 2022-07-15 VITALS — BP 125/75 | HR 83

## 2022-07-15 DIAGNOSIS — O99213 Obesity complicating pregnancy, third trimester: Secondary | ICD-10-CM | POA: Diagnosis not present

## 2022-07-15 DIAGNOSIS — O361130 Maternal care for Anti-A sensitization, third trimester, not applicable or unspecified: Secondary | ICD-10-CM | POA: Diagnosis present

## 2022-07-15 DIAGNOSIS — O3510X0 Maternal care for (suspected) chromosomal abnormality in fetus, unspecified, not applicable or unspecified: Secondary | ICD-10-CM

## 2022-07-15 DIAGNOSIS — Z3A34 34 weeks gestation of pregnancy: Secondary | ICD-10-CM

## 2022-07-15 DIAGNOSIS — E669 Obesity, unspecified: Secondary | ICD-10-CM | POA: Diagnosis not present

## 2022-07-15 DIAGNOSIS — O34219 Maternal care for unspecified type scar from previous cesarean delivery: Secondary | ICD-10-CM | POA: Insufficient documentation

## 2022-07-15 DIAGNOSIS — O360190 Maternal care for anti-D [Rh] antibodies, unspecified trimester, not applicable or unspecified: Secondary | ICD-10-CM

## 2022-07-15 DIAGNOSIS — O09293 Supervision of pregnancy with other poor reproductive or obstetric history, third trimester: Secondary | ICD-10-CM

## 2022-07-15 NOTE — Patient Instructions (Signed)
Jill Evans  07/15/2022   Your procedure is scheduled on:  07/22/2022  Arrive at 1000 at Entrance C on [employer] at [facility]  and [employer]. You are invited to use the FREE valet parking or use the Visitor's parking deck.  Pick up the phone at the desk and dial [phone].  Call this number if you have problems the morning of surgery: [phone]  Remember:   Do not eat food:(After Midnight) Desps de medianoche.  Do not drink clear liquids: (After Midnight) Desps de medianoche.  Take these medicines the morning of surgery with A SIP OF WATER:  Ursodiol    Do not wear jewelry, make-up or nail polish.  Do not wear lotions, powders, or perfumes. Do not wear deodorant.  Do not shave 48 hours prior to surgery.  Do not bring valuables to the hospital.  [facility] is not   responsible for any belongings or valuables brought to the hospital.  Contacts, dentures or bridgework may not be worn into surgery.  Leave suitcase in the car. After surgery it may be brought to your room.  For patients admitted to the hospital, checkout time is 11:00 AM the day of              discharge.      Please read over the following fact sheets that you were given:     Preparing for Surgery

## 2022-07-18 NOTE — Patient Instructions (Signed)
Jill Evans  07/18/2022   Your procedure is scheduled on:  07/22/2022  Arrive at 1000 at Entrance C on [address] at [facility]  and [employer]. You are invited to use the FREE valet parking or use the Visitor's parking deck.  Pick up the phone at the desk and dial [phone].  Call this number if you have problems the morning of surgery: [phone]  Remember:   Do not eat food:(After Midnight) Desps de medianoche.  Do not drink clear liquids: (After Midnight) Desps de medianoche.  Take these medicines the morning of surgery with A SIP OF WATER:  ursodiol   Do not wear jewelry, make-up or nail polish.  Do not wear lotions, powders, or perfumes. Do not wear deodorant.  Do not shave 48 hours prior to surgery.  Do not bring valuables to the hospital.  [facility] is not   responsible for any belongings or valuables brought to the hospital.  Contacts, dentures or bridgework may not be worn into surgery.  Leave suitcase in the car. After surgery it may be brought to your room.  For patients admitted to the hospital, checkout time is 11:00 AM the day of              discharge.      Please read over the following fact sheets that you were given:     Preparing for Surgery

## 2022-07-19 ENCOUNTER — Ambulatory Visit: Payer: Commercial Managed Care - HMO

## 2022-07-19 ENCOUNTER — Other Ambulatory Visit (HOSPITAL_COMMUNITY)
Admission: RE | Admit: 2022-07-19 | Discharge: 2022-07-19 | Disposition: A | Discharge status: Home / Self Care | Payer: Commercial Managed Care - HMO | Source: Ambulatory Visit | Attending: Obstetrics and Gynecology | Admitting: Obstetrics and Gynecology

## 2022-07-19 DIAGNOSIS — Z3483 Encounter for supervision of other normal pregnancy, third trimester: Secondary | ICD-10-CM | POA: Insufficient documentation

## 2022-07-19 DIAGNOSIS — Z3A33 33 weeks gestation of pregnancy: Secondary | ICD-10-CM | POA: Insufficient documentation

## 2022-07-19 LAB — RPR: RPR Ser Ql: NONREACTIVE

## 2022-07-19 LAB — CBC
HCT: 32.2 % — ABNORMAL LOW (ref 36.0–46.0)
Hemoglobin: 10.5 g/dL — ABNORMAL LOW (ref 12.0–15.0)
MCH: 27.5 pg (ref 26.0–34.0)
MCHC: 32.6 g/dL (ref 30.0–36.0)
MCV: 84.3 fL (ref 80.0–100.0)
Platelets: 342 10*3/uL (ref 150–400)
RBC: 3.82 MIL/uL — ABNORMAL LOW (ref 3.87–5.11)
RDW: 12 % (ref 11.5–15.5)
WBC: 9.7 10*3/uL (ref 4.0–10.5)
nRBC: 0 % (ref 0.0–0.2)

## 2022-07-19 LAB — TYPE AND SCREEN: ABO/RH(D): B NEG

## 2022-07-19 LAB — BPAM RBC

## 2022-07-20 ENCOUNTER — Encounter (HOSPITAL_COMMUNITY)
Admission: RE | Admit: 2022-07-20 | Discharge: 2022-07-20 | Disposition: A | Discharge status: Home / Self Care | Payer: Commercial Managed Care - HMO | Source: Ambulatory Visit

## 2022-07-20 HISTORY — DX: Personal history of other complications of pregnancy, childbirth and the puerperium: Z87.59

## 2022-07-21 NOTE — H&P (Signed)
Jill Evans is a 32 y.o. female G51P0101 [redacted]w[redacted]d for scheduled repeat cesarean. She reports no LOF, VB, Contractions. Normal FM.   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 Received betamethasone course 5/21-5/22. Mild pericardial effusion seen on 6/7 MFM scan. Most recent MCA doppler (6/14) normal with no evidence of hydrops.  Cholestasis of pregnancy: diagnosed 5/17, bile acids 15. On ursodiol History of cesarean delivery: NRFHT History of severe preeclampsia: induced at 34 weeks Partial deletion chromosome 13: patient's first child diagnosed with this as well.  - Per MFM consult, "Per  the report from her geneticist in 2001, the region that is  deleted does not include the genetic region of chromosome  13 responsible for retinoblastoma.  There is a 50% chance  the child may inherit the chromosome 13 deletion.  The  patient has declined diagnostic testing via amniocentesis and  would not terminate either way." NIPT showed atypical finding for chromosome 13, which was expected. Quad screen was normal.  6. Obesity: initial BMI 33 7. She does not have custody of her other child (was put up for adoption after delivery, patient is in contact with child/adoptive parents)  Last growth 5/21 EFW 87% (1970g = 4lb5oz)  OB History     Gravida  2   Para  1   Term      Preterm  1   AB      Living  1      SAB      IAB      Ectopic      Multiple  0   Live Births  1          Past Medical History:  Diagnosis Date   Abnormal weight gain 09/22/2021   Anxiety    Encounter for initial prescription of intrauterine contraceptive device (IUD) 01/02/2018   GBS (group B Streptococcus carrier), +RV culture, currently pregnant 10/27/2017   History of pre-eclampsia    Hyperglycemia    Hypothyroidism    patient states she was diagnosed in error   IBS (irritable bowel  syndrome)    Late prenatal care 10/10/2017   Started at 28 weeks; pt states she did not know she was pregnant, found on xray incidentally   Malpresentation before onset of labor 10/26/2017   9/24: breech   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    Pregnancy induced hypertension    Rh negative state in antepartum period 10/26/2017   [x]  rhogam 9/24   Severe preeclampsia, delivered 10/24/2017     Guidelines for Antenatal Testing and Sonography  (with updated ICD-10 codes)  Updated  October 30, 2016 INDICATION U/S NST/AFI or BPP wkly DELIVERY       CHTN - O10.919  Group I   BP < 140/90, no preeclampsia, AGA,  nml AFV, +/- meds    Group II   BP > 140/90, on meds, no preeclampsia, AGA, nml AFV  20-28-34-38  20-24-28-32-35-38  32//2 x wk  28//BPP wkly then 32//2 x wk or BPP wkly  40 no meds; 39 med   Supervision of high risk pregnancy due to social problems 10/26/2017   Wanting to give up for adoption ([employer])   Supervision of high risk pregnancy, antepartum 10/10/2017    Nursing Staff Provider  Office Location  [place] Dating   third trimester ultrasound  Language   English Anatomy US    Flu Vaccine  10/10/17 Genetic Screen  NIPS:   AFP:   First Screen:  Quad:    TDaP vaccine   10/10/17 Hgb A1C or  GTT Early  Third trimester ha1c 5.1  Rhogam   10-25-17   LAB RESULTS   Feeding Plan  Blood Type --/--/B NEG (09/28 1322) B negative  Contraception  Antibody POS (09/28 132   Vitamin D deficiency 06/30/2015   Past Surgical History:  Procedure Laterality Date   CESAREAN SECTION N/A 11/06/2017   Procedure: CESAREAN SECTION;  Surgeon: Conan Bowens, MD;  Location: [facility] BIRTHING SUITES;  Service: Obstetrics;  Laterality: N/A;   mirena     inserted 2019   Family History: family history includes Cancer in her father, maternal grandfather, and paternal grandfather; Deep vein thrombosis in her mother; Depression in her mother; Diabetes in her mother; Hypertension in her father and mother; Obesity in her father and  mother; Other in her mother; Varicose Veins in her mother. Social History:  reports that she has never smoked. She has never used smokeless tobacco. She reports that she does not drink alcohol and does not use drugs.     Maternal Diabetes: No Genetic Screening: Abnormal:  Results: Other: atypical finding chromosome 13 on NIPT, normal QUAD Maternal Ultrasounds/Referrals: Other: fetal anemia Fetal Ultrasounds or other Referrals:  Referred to Materal Fetal Medicine  Maternal Substance Abuse:  No Significant Maternal Medications:  Meds include: Other: ursodiol Significant Maternal Lab Results:  Group B Strep negative Other Comments:  None  Review of Systems Per HPI Exam Physical Exam    Last menstrual period 11/10/2021. Gen: NAD, resting comfortably CVS: normal pulses Lungs: Nonlabored respirations Abd: Gravid abdomen Ext: no calf edema or tenderness  Prenatal labs: ABO, Rh:  --/--/B NEG (06/18 1052) Antibody: POS (06/18 1052) Rubella: Immune (02/01 0000) RPR: NON REACTIVE (06/18 1039)  HBsAg: Negative (02/01 0000)  HIV: Non-reactive (02/01 0000)  GBS:   negative  Assessment/Plan: 31Y G2P0101 @ [redacted]w[redacted]d, scheduled repeat cesarean due to Rh alloimmunization with fetal anemia, cholestasis per MFM recommendation - Procedure reviewed in detail. Discussed risk of infection, bleeding, damage to surrounding organs, blood transfusion, hysterectomy, damage to infant. All questions answered.   [name] 07/21/2022, 11:35 AM

## 2022-07-22 ENCOUNTER — Inpatient Hospital Stay (HOSPITAL_COMMUNITY): Payer: Commercial Managed Care - HMO | Admitting: Anesthesiology

## 2022-07-22 ENCOUNTER — Encounter (HOSPITAL_COMMUNITY): Payer: Self-pay | Admitting: Obstetrics and Gynecology

## 2022-07-22 ENCOUNTER — Encounter (HOSPITAL_COMMUNITY)
Admission: AD | Disposition: A | Discharge status: Home / Self Care | Payer: Self-pay | Source: Home / Self Care | Attending: Obstetrics and Gynecology

## 2022-07-22 ENCOUNTER — Other Ambulatory Visit: Payer: Self-pay

## 2022-07-22 ENCOUNTER — Inpatient Hospital Stay (HOSPITAL_COMMUNITY)
Admission: AD | Admit: 2022-07-22 | Discharge: 2022-07-25 | DRG: 787 | Disposition: A | Discharge status: Home / Self Care | Payer: Commercial Managed Care - HMO | Attending: Obstetrics and Gynecology | Admitting: Obstetrics and Gynecology

## 2022-07-22 DIAGNOSIS — E7879 Other disorders of bile acid and cholesterol metabolism: Secondary | ICD-10-CM | POA: Diagnosis present

## 2022-07-22 DIAGNOSIS — K831 Obstruction of bile duct: Secondary | ICD-10-CM | POA: Diagnosis not present

## 2022-07-22 DIAGNOSIS — Z6791 Unspecified blood type, Rh negative: Secondary | ICD-10-CM | POA: Diagnosis not present

## 2022-07-22 DIAGNOSIS — O26643 Intrahepatic cholestasis of pregnancy, third trimester: Secondary | ICD-10-CM | POA: Diagnosis present

## 2022-07-22 DIAGNOSIS — O34211 Maternal care for low transverse scar from previous cesarean delivery: Principal | ICD-10-CM | POA: Diagnosis present

## 2022-07-22 DIAGNOSIS — K7689 Other specified diseases of liver: Secondary | ICD-10-CM | POA: Diagnosis present

## 2022-07-22 DIAGNOSIS — O99214 Obesity complicating childbirth: Secondary | ICD-10-CM | POA: Diagnosis present

## 2022-07-22 DIAGNOSIS — Z3A35 35 weeks gestation of pregnancy: Secondary | ICD-10-CM | POA: Diagnosis not present

## 2022-07-22 DIAGNOSIS — D62 Acute posthemorrhagic anemia: Secondary | ICD-10-CM | POA: Diagnosis not present

## 2022-07-22 DIAGNOSIS — O99824 Streptococcus B carrier state complicating childbirth: Secondary | ICD-10-CM | POA: Diagnosis present

## 2022-07-22 DIAGNOSIS — O9081 Anemia of the puerperium: Secondary | ICD-10-CM | POA: Diagnosis not present

## 2022-07-22 DIAGNOSIS — Z98891 History of uterine scar from previous surgery: Principal | ICD-10-CM

## 2022-07-22 LAB — COMPREHENSIVE METABOLIC PANEL
ALT: 30 U/L (ref 0–44)
AST: 29 U/L (ref 15–41)
Albumin: 2.3 g/dL — ABNORMAL LOW (ref 3.5–5.0)
Alkaline Phosphatase: 158 U/L — ABNORMAL HIGH (ref 38–126)
Anion gap: 15 (ref 5–15)
BUN: 9 mg/dL (ref 6–20)
CO2: 19 mmol/L — ABNORMAL LOW (ref 22–32)
Calcium: 9.1 mg/dL (ref 8.9–10.3)
Chloride: 102 mmol/L (ref 98–111)
Creatinine, Ser: 0.53 mg/dL (ref 0.44–1.00)
GFR, Estimated: >60 mL/min (ref >60)
Glucose, Bld: 74 mg/dL (ref 70–99)
Potassium: 4 mmol/L (ref 3.5–5.1)
Sodium: 136 mmol/L (ref 135–145)
Total Bilirubin: 0.5 mg/dL (ref 0.3–1.2)
Total Protein: 6 g/dL — ABNORMAL LOW (ref 6.5–8.1)

## 2022-07-22 SURGERY — Surgical Case
Anesthesia: Spinal

## 2022-07-22 MED ORDER — OXYTOCIN-SODIUM CHLORIDE 30-0.9 UT/500ML-% IV SOLN
2.5000 [IU]/h | INTRAVENOUS | Status: AC
  Administered 2022-07-22: 2.5 [IU]/h via INTRAVENOUS
  Filled 2022-07-22: qty 500

## 2022-07-22 MED ORDER — COCONUT OIL OIL
1.0000 | TOPICAL_OIL | Status: DC | PRN
  Administered 2022-07-22: 1 via TOPICAL

## 2022-07-22 MED ORDER — DEXAMETHASONE SODIUM PHOSPHATE 10 MG/ML IJ SOLN
INTRAMUSCULAR | Status: DC | PRN
  Administered 2022-07-22: 10 mg via INTRAVENOUS

## 2022-07-22 MED ORDER — OXYTOCIN-SODIUM CHLORIDE 30-0.9 UT/500ML-% IV SOLN
INTRAVENOUS | Status: DC | PRN
  Administered 2022-07-22: 30 [IU] via INTRAVENOUS

## 2022-07-22 MED ORDER — CYCLOBENZAPRINE HCL 10 MG PO TABS
10.0000 mg | ORAL_TABLET | Freq: Once | ORAL | Status: AC
  Administered 2022-07-22: 10 mg via ORAL
  Filled 2022-07-22: qty 1

## 2022-07-22 MED ORDER — PRENATAL MULTIVITAMIN CH
1.0000 | ORAL_TABLET | Freq: Every day | ORAL | Status: DC
  Administered 2022-07-23 – 2022-07-24 (×2): 1 via ORAL
  Filled 2022-07-22 (×2): qty 1

## 2022-07-22 MED ORDER — PHENYLEPHRINE 80 MCG/ML (10ML) SYRINGE FOR IV PUSH (FOR BLOOD PRESSURE SUPPORT): PREFILLED_SYRINGE | INTRAVENOUS | Status: DC | PRN

## 2022-07-22 MED ORDER — OXYCODONE HCL 5 MG PO TABS
5.0000 mg | ORAL_TABLET | ORAL | Status: DC | PRN
  Administered 2022-07-22 – 2022-07-25 (×11): 10 mg via ORAL
  Filled 2022-07-22 (×11): qty 2

## 2022-07-22 MED ORDER — ZOLPIDEM TARTRATE 5 MG PO TABS: 5.0000 mg | ORAL_TABLET | Freq: Every evening | ORAL | Status: DC | PRN

## 2022-07-22 MED ORDER — DIPHENHYDRAMINE HCL 25 MG PO CAPS: 25.0000 mg | ORAL_CAPSULE | Freq: Four times a day (QID) | ORAL | Status: DC | PRN

## 2022-07-22 MED ORDER — ONDANSETRON HCL 4 MG/2ML IJ SOLN
INTRAMUSCULAR | Status: DC | PRN
  Administered 2022-07-22: 4 mg via INTRAVENOUS

## 2022-07-22 MED ORDER — ACETAMINOPHEN 500 MG PO TABS
1000.0000 mg | ORAL_TABLET | Freq: Four times a day (QID) | ORAL | Status: DC
  Administered 2022-07-22 – 2022-07-25 (×10): 1000 mg via ORAL
  Filled 2022-07-22 (×11): qty 2

## 2022-07-22 MED ORDER — CEFAZOLIN SODIUM-DEXTROSE 2-4 GM/100ML-% IV SOLN
INTRAVENOUS | Status: AC
  Filled 2022-07-22: qty 100

## 2022-07-22 MED ORDER — PHENYLEPHRINE HCL-NACL 20-0.9 MG/250ML-% IV SOLN
INTRAVENOUS | Status: AC
  Filled 2022-07-22: qty 250

## 2022-07-22 MED ORDER — FENTANYL CITRATE (PF) 100 MCG/2ML IJ SOLN
INTRAMUSCULAR | Status: DC | PRN
  Administered 2022-07-22: 15 ug via INTRATHECAL

## 2022-07-22 MED ORDER — CEFAZOLIN SODIUM-DEXTROSE 2-4 GM/100ML-% IV SOLN
2.0000 g | INTRAVENOUS | Status: AC
  Administered 2022-07-22: 2 g via INTRAVENOUS

## 2022-07-22 MED ORDER — WITCH HAZEL-GLYCERIN EX PADS: 1.0000 | MEDICATED_PAD | CUTANEOUS | Status: DC | PRN

## 2022-07-22 MED ORDER — STERILE WATER FOR IRRIGATION IR SOLN
Status: DC | PRN
  Administered 2022-07-22: 1

## 2022-07-22 MED ORDER — SOD CITRATE-CITRIC ACID 500-334 MG/5ML PO SOLN
30.0000 mL | ORAL | Status: AC
  Administered 2022-07-22: 30 mL via ORAL

## 2022-07-22 MED ORDER — SOD CITRATE-CITRIC ACID 500-334 MG/5ML PO SOLN
ORAL | Status: AC
  Filled 2022-07-22: qty 30

## 2022-07-22 MED ORDER — KETOROLAC TROMETHAMINE 30 MG/ML IJ SOLN
INTRAMUSCULAR | Status: AC
  Filled 2022-07-22: qty 1

## 2022-07-22 MED ORDER — OXYTOCIN-SODIUM CHLORIDE 30-0.9 UT/500ML-% IV SOLN
INTRAVENOUS | Status: AC
  Filled 2022-07-22: qty 500

## 2022-07-22 MED ORDER — LACTATED RINGERS IV SOLN: INTRAVENOUS | Status: DC | PRN

## 2022-07-22 MED ORDER — HYDROMORPHONE HCL 1 MG/ML IJ SOLN
0.2000 mg | INTRAMUSCULAR | Status: DC | PRN
  Administered 2022-07-23: 0.6 mg via INTRAVENOUS
  Filled 2022-07-22: qty 1

## 2022-07-22 MED ORDER — KETOROLAC TROMETHAMINE 30 MG/ML IJ SOLN
30.0000 mg | Freq: Four times a day (QID) | INTRAMUSCULAR | Status: AC
  Administered 2022-07-22 – 2022-07-23 (×4): 30 mg via INTRAVENOUS
  Filled 2022-07-22 (×3): qty 1

## 2022-07-22 MED ORDER — DEXAMETHASONE SODIUM PHOSPHATE 10 MG/ML IJ SOLN
INTRAMUSCULAR | Status: AC
  Filled 2022-07-22: qty 1

## 2022-07-22 MED ORDER — IBUPROFEN 600 MG PO TABS
600.0000 mg | ORAL_TABLET | Freq: Four times a day (QID) | ORAL | Status: DC
  Administered 2022-07-23 – 2022-07-25 (×8): 600 mg via ORAL
  Filled 2022-07-22 (×8): qty 1

## 2022-07-22 MED ORDER — ONDANSETRON HCL 4 MG/2ML IJ SOLN
INTRAMUSCULAR | Status: AC
  Filled 2022-07-22: qty 2

## 2022-07-22 MED ORDER — ONDANSETRON HCL 4 MG/2ML IJ SOLN
4.0000 mg | Freq: Three times a day (TID) | INTRAMUSCULAR | Status: DC
  Filled 2022-07-22: qty 2

## 2022-07-22 MED ORDER — SENNOSIDES-DOCUSATE SODIUM 8.6-50 MG PO TABS
2.0000 | ORAL_TABLET | ORAL | Status: DC
  Administered 2022-07-23 – 2022-07-24 (×2): 2 via ORAL
  Filled 2022-07-22 (×2): qty 2

## 2022-07-22 MED ORDER — ONDANSETRON HCL 4 MG/2ML IJ SOLN
4.0000 mg | Freq: Three times a day (TID) | INTRAMUSCULAR | Status: DC | PRN
  Administered 2022-07-22: 4 mg via INTRAVENOUS

## 2022-07-22 MED ORDER — DIBUCAINE (PERIANAL) 1 % EX OINT: 1.0000 | TOPICAL_OINTMENT | CUTANEOUS | Status: DC | PRN

## 2022-07-22 MED ORDER — MORPHINE SULFATE (PF) 0.5 MG/ML IJ SOLN
INTRAMUSCULAR | Status: AC
  Filled 2022-07-22: qty 10

## 2022-07-22 MED ORDER — PHENYLEPHRINE HCL-NACL 20-0.9 MG/250ML-% IV SOLN
INTRAVENOUS | Status: DC | PRN
  Administered 2022-07-22: 60 ug/min via INTRAVENOUS

## 2022-07-22 MED ORDER — BUPIVACAINE IN DEXTROSE 0.75-8.25 % IT SOLN
INTRATHECAL | Status: DC | PRN
  Administered 2022-07-22: 1.5 mL via INTRATHECAL

## 2022-07-22 MED ORDER — SIMETHICONE 80 MG PO CHEW
80.0000 mg | CHEWABLE_TABLET | Freq: Three times a day (TID) | ORAL | Status: DC
  Administered 2022-07-22 – 2022-07-25 (×8): 80 mg via ORAL
  Filled 2022-07-22 (×8): qty 1

## 2022-07-22 MED ORDER — FENTANYL CITRATE (PF) 100 MCG/2ML IJ SOLN
INTRAMUSCULAR | Status: AC
  Filled 2022-07-22: qty 2

## 2022-07-22 MED ORDER — MORPHINE SULFATE (PF) 0.5 MG/ML IJ SOLN
INTRAMUSCULAR | Status: DC | PRN
  Administered 2022-07-22: 150 ug via INTRATHECAL

## 2022-07-22 MED ORDER — MENTHOL 3 MG MT LOZG: 1.0000 | LOZENGE | OROMUCOSAL | Status: DC | PRN

## 2022-07-22 MED ORDER — ACETAMINOPHEN 10 MG/ML IV SOLN
INTRAVENOUS | Status: DC | PRN
  Administered 2022-07-22: 1000 mg via INTRAVENOUS

## 2022-07-22 MED ORDER — SODIUM CHLORIDE 0.9 % IV SOLN
500.0000 mg | INTRAVENOUS | Status: AC
  Administered 2022-07-22: 500 mg via INTRAVENOUS

## 2022-07-22 MED ORDER — SIMETHICONE 80 MG PO CHEW
80.0000 mg | CHEWABLE_TABLET | ORAL | Status: DC | PRN
  Administered 2022-07-23: 80 mg via ORAL
  Filled 2022-07-22: qty 1

## 2022-07-22 MED ORDER — LACTATED RINGERS IV SOLN: INTRAVENOUS | Status: DC

## 2022-07-22 SURGICAL SUPPLY — 32 items
APL PRP STRL LF DISP 70% ISPRP (MISCELLANEOUS) ×2
APL SKNCLS STERI-STRIP NONHPOA (GAUZE/BANDAGES/DRESSINGS) ×1
BENZOIN TINCTURE PRP APPL 2/3 (GAUZE/BANDAGES/DRESSINGS) ×1 IMPLANT
CHLORAPREP W/TINT 26 (MISCELLANEOUS) ×2 IMPLANT
CLAMP UMBILICAL CORD (MISCELLANEOUS) ×1 IMPLANT
CLIP FILSHIE TUBAL LIGA STRL (Clip) ×1 IMPLANT
CLOTH BEACON ORANGE TIMEOUT ST (SAFETY) ×1 IMPLANT
DRSG OPSITE POSTOP 4X10 (GAUZE/BANDAGES/DRESSINGS) ×1 IMPLANT
ELECT REM PT RETURN 9FT ADLT (ELECTROSURGICAL) ×1
ELECTRODE REM PT RTRN 9FT ADLT (ELECTROSURGICAL) ×1 IMPLANT
EXTRACTOR VACUUM BELL CUP MITY (SUCTIONS) IMPLANT
GAUZE SPONGE 4X4 12PLY STRL LF (GAUZE/BANDAGES/DRESSINGS) IMPLANT
GLOVE BIO SURGEON STRL SZ 6 (GLOVE) ×1 IMPLANT
GLOVE BIOGEL PI IND STRL 6.5 (GLOVE) ×1 IMPLANT
GOWN STRL REUS W/TWL LRG LVL3 (GOWN DISPOSABLE) ×2 IMPLANT
KIT ABG SYR 3ML LUER SLIP (SYRINGE) ×1 IMPLANT
NDL HYPO 25X5/8 SAFETYGLIDE (NEEDLE) ×1 IMPLANT
NEEDLE HYPO 25X5/8 SAFETYGLIDE (NEEDLE) ×1 IMPLANT
NS IRRIG 1000ML POUR BTL (IV SOLUTION) ×1 IMPLANT
PACK C SECTION WH (CUSTOM PROCEDURE TRAY) ×1 IMPLANT
PAD ABD DERMACEA PRESS 5X9 (GAUZE/BANDAGES/DRESSINGS) IMPLANT
PAD OB MATERNITY 4.3X12.25 (PERSONAL CARE ITEMS) ×1 IMPLANT
RTRCTR C-SECT PINK 25CM LRG (MISCELLANEOUS) ×1 IMPLANT
STRIP CLOSURE SKIN 1/2X4 (GAUZE/BANDAGES/DRESSINGS) ×1 IMPLANT
SUT MNCRL 0 VIOLET CTX 36 (SUTURE) ×2 IMPLANT
SUT VIC AB 0 CT1 36 (SUTURE) ×2 IMPLANT
SUT VIC AB 3-0 CT1 27 (SUTURE) ×2
SUT VIC AB 3-0 CT1 TAPERPNT 27 (SUTURE) ×1 IMPLANT
SUT VIC AB 4-0 KS 27 (SUTURE) ×1 IMPLANT
TOWEL OR 17X24 6PK STRL BLUE (TOWEL DISPOSABLE) ×1 IMPLANT
TRAY FOLEY W/BAG SLVR 14FR LF (SET/KITS/TRAYS/PACK) ×1 IMPLANT
WATER STERILE IRR 1000ML POUR (IV SOLUTION) ×1 IMPLANT

## 2022-07-22 NOTE — Op Note (Signed)
CESAREAN SECTION Procedure Note  Patient: Jill Evans is a 32 y.o. 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 No scar tissue intraperitoneal.  Normal appearing uterus, fallopian tubes bilaterally, and ovaries bilaterally.  Viable female infant in occiput anterior presentation delivered at 1255 with weight 2750g (6 pounds 1 ounces) Apgars pending at time of note.   Estimated Blood Loss:          Specimens: Placenta to pathology         Complications:  None         Disposition: PACU - hemodynamically stable.         Condition: stable    Description of Procedure: The patient was taken to the operating room where spinal anesthesia was placed and found to be adequate.  The patient was placed in the dorsal supine position.  Fetal heart tones were confirmed. Thromboguards were applied and cycling. A foley catheter was inserted and draining. Ancef 2g and azithromycin 500mg  were given for infection prophylaxis. The patient was subsequently prepped and draped in the normal sterile fashion.    A low transverse skin incision was made with a scalpel and carried down to the level of the fascia with the Bovie.  The fascia was incised in the midline with the scalpel and extended laterally with curved Mayo scissors.  Kocher clamps were applied to the inferior fascial edge and the fascia was dissected off the rectus muscle sharply using the Mayo scissors.  The Kocher clamps were transferred to the superior fascial edge and the underlying rectus muscle was dissected off with curved Mayo's  scissors.  The rectus muscles then were separated in the midline.  The peritoneum was found free of adherent bowel and the peritoneal cavity was entered with Metzenbaum scissors.  The uterus was identified and the alexis retractor was placed intraperitoneal.  A bladder flap was then created sharply with Metzenbaum scissors and separated from the lower uterine segment digitally.   A low transverse hysterotomy was then made with a scalpel.  The infant was found in the vertex presentation was delivered atraumatically and without difficulty with standard maneuvers. A loose nuchal cord was reduced.  After 60 seconds of delayed cord clamping the cord was clamped and cut and the infant was handed off to the pediatricians.  The placenta was delivered with gentle traction on umbilical cord and manual massage of the uterine fundus.  The uterus was cleared of all clot and debris.  The hysterotomy was then closed with 0 monocryl in a running locked fashion,  followed by 0 Monocryl in an imbricating fashion.  The hysterotomy was found to be hemostatic. The peritoneum was closed with 3-0 vicryl in a running fashion. The fascia was closed with a 0 Vicryl suture in a continuous running fashion.  The subcutaneous tissue was irrigated and rendered hemostatic with cautery.  The subcutaneous layer was subsequently closed with 3-0 Vicryl in a continuous running fashion.  The skin was closed with 4-0 vicryl  in a running subcuticular fashion.  Sponge, lap and needle counts were correct. Steri strips and a Honeycomb dressing were placed on the  incision and a pressure dressing was applied  [name] 07/22/22  1:33 PM

## 2022-07-22 NOTE — Anesthesia Preprocedure Evaluation (Addendum)
Anesthesia Evaluation  Patient identified by MRN, date of birth, ID band Patient awake    Reviewed: Allergy & Precautions, H&P , NPO status , Patient's Chart, lab work & pertinent test results  History of Anesthesia Complications Negative for: history of anesthetic complications  Airway Mallampati: II  TM Distance: >3 FB Neck ROM: full    Dental no notable dental hx. 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    Reproductive/Obstetrics (+) Pregnancy                             Anesthesia Physical Anesthesia Plan  ASA: 3  Anesthesia Plan: Spinal   Post-op Pain Management: Minimal or no pain anticipated   Induction: Intravenous  PONV Risk Score and Plan: 2 and Treatment may vary due to age or medical condition and Scopolamine patch - Pre-op  Airway Management Planned: Natural Airway and Simple Face Mask  Additional Equipment: None  Intra-op Plan:   Post-operative Plan:   Informed Consent: I have reviewed the patients History and Physical, chart, labs and discussed the procedure including the risks, benefits and alternatives for the proposed anesthesia with the patient or authorized representative who has indicated his/her understanding and acceptance.        Plan Discussed with: Anesthesiologist and CRNA  Anesthesia Plan Comments: (  )        Anesthesia Quick Evaluation

## 2022-07-22 NOTE — Lactation Note (Signed)
This note was copied from a baby's chart.  NICU Lactation Consultation Note  Patient Name: Jill Evans [id number] Date: 07/22/2022 Age:32 hours  Reason for consult: Initial assessment; NICU baby; Late-preterm 34-36.6wks; Maternal endocrine disorder; Other (Comment) (PIH) Type of Endocrine Disorder?: Thyroid  SUBJECTIVE  LC in to visit with P1 Mom of LPTI admitted to the NICU for respiratory support.  Mom delivered by C/Section and is not feeling well currently.    LC set up DEBP and briefly educated Mom on pumping.    LC reviewed breast massage and hand expression briefly, no colostrum expressed as Mom's nipples are tender.  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  Mom states her nipples have always looked like that.  LC recommended 24 mm flange for left breast and 21 mm flange on right.  Both areolas are tender to touch.  Coconut oil provided for lubrication.  Handouts on pump kit cleaning and pumping routine provided.  Mom to ask her RN for assistance when she would like to start pumping.  Mom nauseated.  OBJECTIVE Infant data: No data recorded Infant feeding assessment No data recorded  Maternal data: G2P0202  C-Section, Low Transverse Has patient been taught Hand Expression?: Yes Hand Expression Comments: Mom's nipples tender, demonstrated gently Significant Breast History:: ++ breast changes in pregnancy Current breast feeding challenges:: Infant separation Does the patient have breastfeeding experience prior to this delivery?: No Pumping frequency: Encouraged to pump as soon as feeling better, or <6 hrs post partum.  Pump both breasts every 3 hrs Flange Size: 24; 21 Risk factor for low milk supply:: Hypothyroidism, GHTN, Infant separation  Pump: Personal (Mom unsure of brand presently)  ASSESSMENT Infant: Feeding Status: NPO  Maternal: No data recorded INTERVENTIONS/PLAN Interventions: Interventions: Breast feeding basics reviewed; Breast  massage; Hand express; DEBP; LC Services brochure Tools: Pump; Flanges Pump Education: Setup, frequency, and cleaning; Milk Storage (CDC handouts provided)  Plan: Consult Status: NICU follow-up NICU Follow-up type: New admission follow up   Jill Evans 07/22/2022, 3:52 PM

## 2022-07-22 NOTE — Anesthesia Procedure Notes (Addendum)
Spinal  Patient location during procedure: OR Start time: 07/22/2022 12:29 PM End time: 07/22/2022 12:35 PM Reason for block: surgical anesthesia Staffing Anesthesiologist: Bethena Midget, MD Performed by: Bethena Midget, MD Authorized by: Bethena Midget, MD   Preanesthetic Checklist Completed: patient identified, IV checked, site marked, risks and benefits discussed, surgical consent, monitors and equipment checked, pre-op evaluation and timeout performed Spinal Block Patient position: sitting Prep: DuraPrep Patient monitoring: heart rate, cardiac monitor, continuous pulse ox and blood pressure Approach: midline Location: L4-5 Injection technique: single-shot Needle Needle type: Sprotte  Needle gauge: 24 G Needle length: 9 cm Assessment Sensory level: T4 Events: CSF return and second provider Additional Notes Multiple attempts at L3/4  Move one level up

## 2022-07-22 NOTE — Interval H&P Note (Signed)
History and Physical Interval Note:  07/22/2022 11:47 AM  Jill Evans  has presented today for surgery, with the diagnosis of repeat c-section.  The various methods of treatment have been discussed with the patient and family. After consideration of risks, benefits and other options for treatment, the patient has consented to  Repeat cesarean as a surgical intervention.  The patient's history has been reviewed, patient examined, no change in status, stable for surgery.  I have reviewed the patient's chart and labs.  Questions were answered to the patient's satisfaction.     [name]

## 2022-07-22 NOTE — Transfer of Care (Signed)
Immediate Anesthesia Transfer of Care Note  Patient: Jill Evans  Procedure(s) Performed: CESAREAN SECTION  Patient Location: PACU  Anesthesia Type:Spinal  Level of Consciousness: awake, alert , and oriented  Airway & Oxygen Therapy: Patient Spontanous Breathing  Post-op Assessment: Report given to RN and Post -op Vital signs reviewed and stable  Post vital signs: Reviewed and stable  Last Vitals:  Vitals Value Taken Time  BP 108/82 07/22/22 1345  Temp    Pulse 96 07/22/22 1347  Resp 18 07/22/22 1347  SpO2 96 % 07/22/22 1347  Vitals shown include unvalidated device data.  Last Pain:  Vitals:   07/22/22 1123  TempSrc: Oral         Complications: No notable events documented.

## 2022-07-23 LAB — BPAM RBC
Blood Product Expiration Date: 202407192359
Blood Product Expiration Date: 202407202359
Unit Type and Rh: 1700
Unit Type and Rh: 1700

## 2022-07-23 LAB — TYPE AND SCREEN
Antibody Screen: POSITIVE
Unit division: 0
Unit division: 0

## 2022-07-23 LAB — CBC
HCT: 22.6 % — ABNORMAL LOW (ref 36.0–46.0)
Hemoglobin: 7.4 g/dL — ABNORMAL LOW (ref 12.0–15.0)
MCH: 28 pg (ref 26.0–34.0)
MCHC: 32.7 g/dL (ref 30.0–36.0)
MCV: 85.6 fL (ref 80.0–100.0)
Platelets: 260 10*3/uL (ref 150–400)
RBC: 2.64 MIL/uL — ABNORMAL LOW (ref 3.87–5.11)
RDW: 11.9 % (ref 11.5–15.5)
WBC: 15.4 10*3/uL — ABNORMAL HIGH (ref 4.0–10.5)
nRBC: 0 % (ref 0.0–0.2)

## 2022-07-23 MED ORDER — LACTATED RINGERS IV SOLN: INTRAVENOUS | Status: DC

## 2022-07-23 MED ORDER — SODIUM CHLORIDE 0.9 % IV SOLN: INTRAVENOUS | Status: DC | PRN

## 2022-07-23 MED ORDER — LACTATED RINGERS IV BOLUS
1000.0000 mL | Freq: Once | INTRAVENOUS | Status: AC
  Administered 2022-07-23: 1000 mL via INTRAVENOUS

## 2022-07-23 MED ORDER — SODIUM CHLORIDE 0.9 % IV SOLN
500.0000 mg | Freq: Once | INTRAVENOUS | Status: AC
  Administered 2022-07-23: 500 mg via INTRAVENOUS
  Filled 2022-07-23: qty 25

## 2022-07-23 MED ORDER — SODIUM CHLORIDE 0.9 % IV BOLUS: 500.0000 mL | Freq: Once | INTRAVENOUS | Status: DC | PRN

## 2022-07-23 MED ORDER — ALBUTEROL SULFATE (2.5 MG/3ML) 0.083% IN NEBU: 2.5000 mg | INHALATION_SOLUTION | Freq: Once | RESPIRATORY_TRACT | Status: DC | PRN

## 2022-07-23 MED ORDER — EPINEPHRINE PF 1 MG/ML IJ SOLN: 0.3000 mg | Freq: Once | INTRAMUSCULAR | Status: DC | PRN

## 2022-07-23 MED ORDER — DIPHENHYDRAMINE HCL 50 MG/ML IJ SOLN: 25.0000 mg | Freq: Once | INTRAMUSCULAR | Status: DC | PRN

## 2022-07-23 MED ORDER — IRON SUCROSE 500 MG IVPB - SIMPLE MED: 500.0000 mg | Freq: Once | INTRAVENOUS | Status: DC

## 2022-07-23 MED ORDER — DIPHENHYDRAMINE-ZINC ACETATE 2-0.1 % EX CREA: TOPICAL_CREAM | Freq: Three times a day (TID) | CUTANEOUS | Status: DC | PRN

## 2022-07-23 MED ORDER — GABAPENTIN 300 MG PO CAPS
300.0000 mg | ORAL_CAPSULE | Freq: Once | ORAL | Status: AC
  Administered 2022-07-23: 300 mg via ORAL
  Filled 2022-07-23: qty 1

## 2022-07-23 MED ORDER — METHYLPREDNISOLONE SODIUM SUCC 125 MG IJ SOLR: 125.0000 mg | Freq: Once | INTRAMUSCULAR | Status: DC | PRN

## 2022-07-23 MED ORDER — HYDROMORPHONE HCL 1 MG/ML IJ SOLN
1.0000 mg | Freq: Once | INTRAMUSCULAR | Status: AC
  Administered 2022-07-23: 1 mg via INTRAVENOUS
  Filled 2022-07-23: qty 1

## 2022-07-23 NOTE — Plan of Care (Signed)

## 2022-07-23 NOTE — Progress Notes (Signed)
Subjective: Postpartum Day 1: Cesarean Delivery Patient is doing well this morning. Pain is controlled. Ambulating, voiding, tolerating PO. Minimal lochia.    Objective: Patient Vitals for the past 24 hrs:  BP Temp Temp src Pulse Resp SpO2 Height Weight  07/23/22 0926 131/73 (!) 97 F (36.1 C) Axillary 89 18 98 % -- --  07/23/22 0427 123/83 -- -- 73 18 98 % -- --  07/23/22 0009 122/80 98 F (36.7 C) Oral 60 16 97 % -- --  07/22/22 1925 118/77 97.9 F (36.6 C) Oral 70 18 99 % -- --  07/22/22 1639 (!) 106/51 -- -- 76 18 98 % -- --  07/22/22 1638 -- 97.9 F (36.6 C) Oral -- -- -- -- --  07/22/22 1558 126/78 -- -- 86 18 99 % -- --  07/22/22 1503 118/70 98.3 F (36.8 C) Oral 73 -- 99 % -- --  07/22/22 1430 120/78 98.3 F (36.8 C) Oral 72 17 95 % -- --  07/22/22 1415 121/80 -- -- 79 17 99 % -- --  07/22/22 1403 107/88 -- Oral 86 (!) 23 99 % -- --  07/22/22 1345 108/82 (!) 97.5 F (36.4 C) -- 94 (!) 22 98 % -- --  07/22/22 1133 -- -- -- -- -- -- 4\' 11"  (1.499 m) 83 kg  07/22/22 1124 (!) 137/99 -- -- 90 -- -- -- --  07/22/22 1123 -- 97.9 F (36.6 C) Oral -- 18 -- -- --    Physical Exam:  General: alert, cooperative, and no distress Lochia: appropriate Uterine Fundus: firm Incision: healing well, no significant drainage, no dehiscence DVT Evaluation: No evidence of DVT seen on physical exam. GU: foley in place with 600c clear yellow urine in bag  Recent Labs    07/23/22 0514  HGB 7.4*  HCT 22.6*    Assessment/Plan: Jill Evans [id number] POD#1 sp RCS at [redacted]w[redacted]d 1. PPC: routine PP Care 2. Anemia of pregnancy + acute blood loss anemia: hgb 7.4 this AM, IV iron ordered, patient agrees 3. SW consult requested as last child was placed for adoption   [name] 07/23/2022, 9:37 AM

## 2022-07-23 NOTE — Plan of Care (Signed)
  Problem: Health Behavior/Discharge Planning: Goal: Ability to manage health-related needs will improve Outcome: Progressing   Problem: Clinical Measurements: Goal: Ability to maintain clinical measurements within normal limits will improve Outcome: Progressing Goal: Will remain free from infection Outcome: Progressing Goal: Diagnostic test results will improve Outcome: Progressing Goal: Respiratory complications will improve Outcome: Progressing Goal: Cardiovascular complication will be avoided Outcome: Progressing   Problem: Activity: Goal: Risk for activity intolerance will decrease Outcome: Progressing   Problem: Coping: Goal: Level of anxiety will decrease Outcome: Progressing   Problem: Elimination: Goal: Will not experience complications related to bowel motility Outcome: Progressing Goal: Will not experience complications related to urinary retention Outcome: Progressing   Problem: Pain Managment: Goal: General experience of comfort will improve Outcome: Progressing   Problem: Safety: Goal: Ability to remain free from injury will improve Outcome: Progressing   Problem: Skin Integrity: Goal: Risk for impaired skin integrity will decrease Outcome: Progressing   Problem: Education: Goal: Knowledge of the prescribed therapeutic regimen will improve Outcome: Progressing Goal: Understanding of sexual limitations or changes related to disease process or condition will improve Outcome: Progressing Goal: Individualized Educational Video(s) Outcome: Progressing   Problem: Self-Concept: Goal: Communication of feelings regarding changes in body function or appearance will improve Outcome: Progressing   Problem: Skin Integrity: Goal: Demonstration of wound healing without infection will improve Outcome: Progressing   Problem: Education: Goal: Knowledge of condition will improve Outcome: Progressing   Problem: Coping: Goal: Ability to identify and utilize  available resources and services will improve Outcome: Progressing   Problem: Life Cycle: Goal: Chance of risk for complications during the postpartum period will decrease Outcome: Progressing   Problem: Skin Integrity: Goal: Demonstration of wound healing without infection will improve Outcome: Progressing

## 2022-07-23 NOTE — Lactation Note (Signed)
This note was copied from a baby's chart.  NICU Lactation Consultation Note  Patient Name: Jill Evans [id number] Date: 07/23/2022 Age:32 hours  Reason for consult: Follow-up assessment; Late-preterm 34-36.6wks; NICU baby; Maternal endocrine disorder Type of Endocrine Disorder?: Thyroid (hypothyroid)  SUBJECTIVE Visited with family of 55 46/47 weeks old AGA NICU female; Jill Evans is a P2 and reported that she didn't breastfeed the first time because baby was given up for adoption. She brought this up privately, can't discuss this in front of visitors. She reported getting engorged, and had some questions on what to do this time around. Assisted with hand expression per her request and stressed the importance of consistent pumping for the onset of lactogenesis II and the prevention of engorgement. Let her know that the purpose of pumping this early on is mainly for breast stimulation and not to get volume, she voiced understanding. Reviewed pumping schedule, lactogenesis II and anticipatory guidelines.  OBJECTIVE Infant data: Mother's Current Feeding Choice: -- (NPO)  Maternal data: G2P0202  C-Section, Low Transverse Has patient been taught Hand Expression?: Yes Hand Expression Comments: Mom's nipples tender, demonstrated gently Significant Breast History:: ++ breast changes in pregnancy Current breast feeding challenges:: NICU admission Does the patient have breastfeeding experience prior to this delivery?: No Pumping frequency: 2 times/24 hours Pumped volume: 2 mL Flange Size: 21; 24 (# 21 on R side and # 24 on L) Risk factor for low milk supply:: hypothyroid, infant separation, PPH of 733 cc.  Pump: Personal (Mom unsure of brand presently)  ASSESSMENT Infant: Feeding Status: NPO  Maternal: Milk volume: Normal  INTERVENTIONS/PLAN Interventions: Interventions: Breast feeding basics reviewed; DEBP; Education; Breast massage; Hand express; Coconut oil Tools: Pump;  Flanges; Coconut oil Pump Education: Setup, frequency, and cleaning; Milk Storage  Plan: Encouraged pumping every 3 hours, ideally 8 pumping sessions/24 hours Breast massage, hand expression and coconut oil were also encouraged prior pumping  FOB present and supportive. All questions and concerns answered, family to contact [facility] services PRN.  Consult Status: NICU follow-up NICU Follow-up type: Maternal D/C visit   Jill Evans 07/23/2022, 3:48 PM

## 2022-07-23 NOTE — Progress Notes (Addendum)
Called by RN Vira Blanco re: abdominal pain not controlled with pain regimen. Has received ibuprofen, tylenol, oxycodone, IV dilaudid, gabapentin without relief.  Patient seen and assessed at bedside. Appears to be in significant pain. Points to left edge of incision. Had been doing fine today until returned from NICU. Voiding, passing flatus, appropriate bleeding today.   Today's Vitals   07/23/22 1910 07/23/22 2016 07/23/22 2118 07/23/22 2259  BP:      Pulse:      Resp:      Temp:      TempSrc:      SpO2:      Weight:      Height:      PainSc: 9  10-Worst pain ever 9  10-Worst pain ever   Body mass index is 36.96 kg/m.   Abdominal exam: moderately distended, soft, no rebound or guarding, tender to deep palpation left edge of incision. Incision appear clean/dry/intact with honeycomb.   Additional dose IV dilaudid ordered and will get stat CT to rule out intraabdominal process.   Alinda Deem MD 07/23/22 11:13 PM

## 2022-07-24 ENCOUNTER — Inpatient Hospital Stay (HOSPITAL_COMMUNITY): Payer: Commercial Managed Care - HMO

## 2022-07-24 LAB — CBC WITH DIFFERENTIAL/PLATELET
Abs Immature Granulocytes: 0.06 10*3/uL (ref 0.00–0.07)
Basophils Absolute: 0 10*3/uL (ref 0.0–0.1)
Basophils Relative: 0 %
Eosinophils Absolute: 0.2 10*3/uL (ref 0.0–0.5)
Eosinophils Relative: 2 %
HCT: 21 % — ABNORMAL LOW (ref 36.0–46.0)
Hemoglobin: 6.8 g/dL — CL (ref 12.0–15.0)
Immature Granulocytes: 1 %
Lymphocytes Relative: 20 %
Lymphs Abs: 1.9 10*3/uL (ref 0.7–4.0)
MCH: 27.4 pg (ref 26.0–34.0)
MCHC: 32.4 g/dL (ref 30.0–36.0)
MCV: 84.7 fL (ref 80.0–100.0)
Monocytes Absolute: 0.5 10*3/uL (ref 0.1–1.0)
Monocytes Relative: 5 %
Neutro Abs: 7.1 10*3/uL (ref 1.7–7.7)
Neutrophils Relative %: 72 %
Platelets: 252 10*3/uL (ref 150–400)
RBC: 2.48 MIL/uL — ABNORMAL LOW (ref 3.87–5.11)
RDW: 12.2 % (ref 11.5–15.5)
WBC: 9.7 10*3/uL (ref 4.0–10.5)
nRBC: 0 % (ref 0.0–0.2)

## 2022-07-24 LAB — TYPE AND SCREEN: ABO/RH(D): B NEG

## 2022-07-24 LAB — BPAM RBC
ISSUE DATE / TIME: 202406230953
ISSUE DATE / TIME: 202406231316

## 2022-07-24 LAB — PREPARE RBC (CROSSMATCH)

## 2022-07-24 MED ORDER — HYDROCORTISONE 1 % EX CREA
TOPICAL_CREAM | Freq: Two times a day (BID) | CUTANEOUS | Status: DC
  Filled 2022-07-24: qty 28

## 2022-07-24 MED ORDER — SODIUM CHLORIDE 0.9% IV SOLUTION: Freq: Once | INTRAVENOUS | Status: AC

## 2022-07-24 MED ORDER — ZOLPIDEM TARTRATE 5 MG PO TABS: 5.0000 mg | ORAL_TABLET | Freq: Once | ORAL | Status: DC

## 2022-07-24 MED ORDER — LORAZEPAM 1 MG PO TABS
0.5000 mg | ORAL_TABLET | Freq: Once | ORAL | Status: AC
  Administered 2022-07-24: 0.5 mg via ORAL
  Filled 2022-07-24: qty 1

## 2022-07-24 MED ORDER — HYDROMORPHONE HCL 1 MG/ML IJ SOLN
1.0000 mg | INTRAMUSCULAR | Status: DC | PRN
  Filled 2022-07-24: qty 1

## 2022-07-24 MED ORDER — IOHEXOL 350 MG/ML SOLN
75.0000 mL | Freq: Once | INTRAVENOUS | Status: AC | PRN
  Administered 2022-07-24: 75 mL via INTRAVENOUS

## 2022-07-24 NOTE — Lactation Note (Signed)
This note was copied from a baby's chart.  NICU Lactation Consultation Note  Patient Name: Jill Evans [id number] Date: 07/24/2022 Age:32 hours  Reason for consult: Follow-up assessment; NICU baby; Late-preterm 34-36.6wks; Maternal endocrine disorder; 1st time breastfeeding Type of Endocrine Disorder?: Thyroid (hypothyroid)  SUBJECTIVE Visited with family of 76 47/65 weeks old AGA NICU female; Ms. Diefenderfer is a P2 and reports she pumped once, she hasn't had a the chance to get in the 3 hour schedule due to having complications, she's getting a blood transfusion now. Let her know that NICU LC will F/U with her tomorrow once she feels better, she was very sleepy and about to eat her dinner.   OBJECTIVE Infant data: Mother's Current Feeding Choice: Breast Milk and Donor Milk  Infant feeding assessment Scale for Readiness: 3   Maternal data: G2P0202  C-Section, Low Transverse Current breast feeding challenges:: NICU admission Pumping frequency: 1 times/24 hours Pumped volume: 0 mL (drops) Flange Size: 21; 24 (# 21 on R and # 24 on L) Risk factor for low milk supply:: hypothyroid, infant separation, PPH of 733 cc.  Pump: Personal (Mom unsure of brand presently)  ASSESSMENT Infant: Feeding Status: Scheduled 9-12-3-6  Maternal: Milk volume: Normal  INTERVENTIONS/PLAN Interventions: Interventions: Breast feeding basics reviewed; DEBP; Coconut oil; Education Tools: Pump; Flanges; Coconut oil Pump Education: Setup, frequency, and cleaning; Milk Storage  Plan: Encouraged pumping every 3 hours, ideally 8 pumping sessions/24 hours Breast massage, hand expression and coconut oil were also encouraged prior pumping   No other support person at this time. All questions and concerns answered, family to contact [facility] services PRN.   Consult Status: NICU follow-up NICU Follow-up type: Maternal D/C visit   Krystyna Cleckley Venetia Constable 07/24/2022, 3:19 PM

## 2022-07-24 NOTE — Plan of Care (Signed)
  Problem: Health Behavior/Discharge Planning: Goal: Ability to manage health-related needs will improve Outcome: Progressing   Problem: Clinical Measurements: Goal: Ability to maintain clinical measurements within normal limits will improve Outcome: Progressing Goal: Will remain free from infection Outcome: Progressing Goal: Diagnostic test results will improve Outcome: Progressing Goal: Respiratory complications will improve Outcome: Progressing Goal: Cardiovascular complication will be avoided Outcome: Progressing   Problem: Activity: Goal: Risk for activity intolerance will decrease Outcome: Progressing   Problem: Coping: Goal: Level of anxiety will decrease Outcome: Progressing   Problem: Elimination: Goal: Will not experience complications related to bowel motility Outcome: Progressing   Problem: Pain Managment: Goal: General experience of comfort will improve Outcome: Progressing   Problem: Safety: Goal: Ability to remain free from injury will improve Outcome: Progressing   Problem: Skin Integrity: Goal: Risk for impaired skin integrity will decrease Outcome: Progressing   Problem: Skin Integrity: Goal: Demonstration of wound healing without infection will improve Outcome: Progressing   Problem: Education: Goal: Knowledge of condition will improve Outcome: Progressing   Problem: Coping: Goal: Ability to identify and utilize available resources and services will improve Outcome: Progressing   Problem: Life Cycle: Goal: Chance of risk for complications during the postpartum period will decrease Outcome: Progressing   Problem: Skin Integrity: Goal: Demonstration of wound healing without infection will improve Outcome: Progressing

## 2022-07-24 NOTE — Progress Notes (Signed)
Subjective: Postpartum Day 2: Cesarean Delivery Patient had significant abdominal pain overnight, not well controlled with IV dilaudid. CT scan showed 5.4 x 4.3 cm hypodense region in LUS c/w hematoma, otherwise normal. Shortly after returning from CT scan, patient passed 5cm blood clot and experienced significant relief. Her pain is much better controlled this AM. She is tolerating PO and voiding. Hasn't ambulated much.    Objective: Patient Vitals for the past 24 hrs:  BP Temp Temp src Pulse Resp SpO2  07/24/22 0828 115/80 (!) 97.3 F (36.3 C) Axillary 84 18 98 %  07/24/22 0311 (!) 119/55 97.9 F (36.6 C) Oral 70 16 99 %  07/23/22 2321 126/73 98 F (36.7 C) Oral 77 17 99 %  07/23/22 1851 138/89 97.7 F (36.5 C) Oral 98 17 98 %  07/23/22 1156 123/75 97.7 F (36.5 C) Oral 84 18 97 %  07/23/22 0926 131/73 (!) 97 F (36.1 C) Axillary 89 18 98 %     Physical Exam:  General: alert, cooperative, and no distress Lochia: appropriate Uterine Fundus: firm Incision: healing well, no significant drainage, no dehiscence, no significant erythema DVT Evaluation: No evidence of DVT seen on physical exam.  Recent Labs    07/23/22 0514 07/24/22 0510  HGB 7.4* 6.8*  HCT 22.6* 21.0*    Assessment/Plan: Jill Evans [id number] POD#2 sp RCS at [redacted]w[redacted]d for Rh alloimmunization/fetal anemia 1. PPC: routine PP Care 2. Abdominal pain: much improved this AM after passing clot. Very likely that suspected hematoma in LUS was blood clot that patient passed last night. Continue to monitor.  3. Anemia of pregnancy + acute blood loss anemia: passed large clot overnight, hgb 6.8 this morning. Pt feeling fatigued. Received IV iron yesterday. Will order 2U PRBC.  4. Dispo: keep inpatient for pain management and blood transfusion today. Consider discharge POD3 or 4.   Jill Evans 07/24/2022, 8:56 AM

## 2022-07-24 NOTE — Anesthesia Postprocedure Evaluation (Signed)
Anesthesia Post Note  Patient: Jill Evans  Procedure(s) Performed: CESAREAN SECTION     Patient location during evaluation: PACU Anesthesia Type: Spinal Level of consciousness: oriented and awake and alert Pain management: pain level controlled Vital Signs Assessment: post-procedure vital signs reviewed and stable Respiratory status: spontaneous breathing, respiratory function stable and patient connected to nasal cannula oxygen Cardiovascular status: blood pressure returned to baseline and stable Postop Assessment: no headache, no backache and no apparent nausea or vomiting Anesthetic complications: no   No notable events documented.  Last Vitals:  Vitals:   07/24/22 0947 07/24/22 1013  BP: 138/79 (!) 137/94  Pulse: 80 94  Resp: 16 18  Temp: 36.6 C 36.6 C  SpO2: 98% 100%    Last Pain:  Vitals:   07/24/22 1013  TempSrc: Oral  PainSc:                  Barclay Lennox

## 2022-07-25 ENCOUNTER — Encounter (HOSPITAL_COMMUNITY): Payer: Self-pay | Admitting: Obstetrics and Gynecology

## 2022-07-25 LAB — CBC WITH DIFFERENTIAL/PLATELET
Abs Immature Granulocytes: 0.1 10*3/uL — ABNORMAL HIGH (ref 0.00–0.07)
Basophils Absolute: 0 10*3/uL (ref 0.0–0.1)
Basophils Relative: 0 %
Eosinophils Absolute: 0.2 10*3/uL (ref 0.0–0.5)
Eosinophils Relative: 2 %
HCT: 35.6 % — ABNORMAL LOW (ref 36.0–46.0)
Hemoglobin: 11.4 g/dL — ABNORMAL LOW (ref 12.0–15.0)
Immature Granulocytes: 1 %
Lymphocytes Relative: 20 %
Lymphs Abs: 2.3 10*3/uL (ref 0.7–4.0)
MCH: 28.5 pg (ref 26.0–34.0)
MCHC: 32 g/dL (ref 30.0–36.0)
MCV: 89 fL (ref 80.0–100.0)
Monocytes Absolute: 0.6 10*3/uL (ref 0.1–1.0)
Monocytes Relative: 5 %
Neutro Abs: 8.1 10*3/uL — ABNORMAL HIGH (ref 1.7–7.7)
Neutrophils Relative %: 72 %
Platelets: 249 10*3/uL (ref 150–400)
RBC: 4 MIL/uL (ref 3.87–5.11)
RDW: 12.8 % (ref 11.5–15.5)
WBC: 11.3 10*3/uL — ABNORMAL HIGH (ref 4.0–10.5)
nRBC: 0.2 % (ref 0.0–0.2)

## 2022-07-25 LAB — TYPE AND SCREEN
Antibody Screen: POSITIVE
Unit division: 0
Unit division: 0

## 2022-07-25 LAB — BPAM RBC
Blood Product Expiration Date: 202407192359
Blood Product Expiration Date: 202407202359
Unit Type and Rh: 1700
Unit Type and Rh: 1700

## 2022-07-25 LAB — SURGICAL PATHOLOGY

## 2022-07-25 MED ORDER — OXYCODONE-ACETAMINOPHEN 5-325 MG PO TABS: 1.0000 | ORAL_TABLET | ORAL | 0 refills | Status: AC | PRN

## 2022-07-25 MED ORDER — HYDROCORTISONE 1 % EX CREA: 1.0000 | TOPICAL_CREAM | Freq: Three times a day (TID) | CUTANEOUS | 1 refills | Status: DC

## 2022-07-25 MED ORDER — IBUPROFEN 600 MG PO TABS: 600.0000 mg | ORAL_TABLET | Freq: Four times a day (QID) | ORAL | 1 refills | Status: DC | PRN

## 2022-07-25 NOTE — Discharge Summary (Signed)
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 yo G2P0202 at [redacted]w[redacted]d was admitted to the hospital 07/22/2022 for scheduled cesarean section with the following indication:Rh alloimmunization .Delivery details are as follows:  Membrane Rupture Time/Date: 12:54 PM ,07/22/2022   Delivery Method:C-Section, Low Transverse  Details of operation can be found in separate operative note.  Patient had a postpartum course complicated by anemia of acute blood loss .  She is ambulating, tolerating a regular diet, passing flatus, and urinating well. Patient is discharged home in stable condition on  07/25/22        Newborn Data: Birth date:07/22/2022  Birth time:12:55 PM  Gender:Female  Living status:Living  Apgars:6 ,8  Weight:2750 g     Magnesium Sulfate received: No BMZ received: Yes  Transfusion:Yes  Physical exam  Vitals:   07/25/22 0543 07/25/22 0544 07/25/22 0545 07/25/22 0812  BP: (!) 147/82 (!) 147/82  125/88  Pulse: 86 86  84  Resp: 20   18  Temp: 98 F (36.7 C)   97.9 F (36.6 C)  TempSrc:  Oral   Oral  SpO2: 98%  98% 98%  Weight:      Height:       General: alert and cooperative Lochia: appropriate Uterine Fundus: firm, diffuse urticaric rash over abdomen  Incision: Dressing is clean, dry, and intact DVT Evaluation: No evidence of DVT seen on physical exam. Labs: Lab Results  Component Value Date   WBC 11.3 (H) 07/25/2022   HGB 11.4 (L) 07/25/2022   HCT 35.6 (L) 07/25/2022   MCV 89.0 07/25/2022   PLT 249 07/25/2022      Latest Ref Rng & Units 07/22/2022   11:59 AM  CMP  Glucose 70 - 99 mg/dL 74   BUN 6 - 20 mg/dL 9   Creatinine 7.82 - 9.56 mg/dL 2.13   Sodium 086 - 578 mmol/L 136   Potassium 3.5 - 5.1 mmol/L 4.0   Chloride 98 - 111 mmol/L 102   CO2 22 - 32 mmol/L 19   Calcium 8.9 - 10.3 mg/dL 9.1   Total Protein 6.5 - 8.1 g/dL 6.0   Total Bilirubin 0.3 - 1.2 mg/dL 0.5   Alkaline Phos 38 - 126 U/L 158   AST 15 - 41 U/L 29   ALT 0 - 44 U/L 30    Edinburgh Score:    07/23/2022    9:00 AM  Edinburgh Postnatal Depression Scale Screening Tool  I have been able to laugh and see the funny  side of things. 0  I have looked forward with enjoyment to things. 0  I have blamed myself unnecessarily when things went wrong. 1  I have been anxious or worried for no good reason. 1  I have felt scared or panicky for no good reason. 1  Things have been getting on top of me. 1  I have been so unhappy that I have had difficulty sleeping. 0  I have felt sad or miserable. 0  I have been so unhappy that I have been crying. 0  The thought of harming myself has occurred to me. 0  Edinburgh Postnatal Depression Scale Total 4      After visit meds:  Allergies as of 07/25/2022   No Known Allergies      Medication List     STOP taking these medications    aspirin EC 81 MG tablet   ursodiol 300 MG capsule Commonly known as: ACTIGALL       TAKE these medications    cyclobenzaprine 5 MG tablet Commonly known as: FLEXERIL Take 5 mg by mouth 3 (three) times  daily as needed for muscle spasms.   hydrocortisone cream 1 % Apply 1 Application topically 3 (three) times daily. What changed:  when to take this reasons to take this   hydrOXYzine 50 MG tablet Commonly known as: ATARAX Take 100 mg by mouth 2 (two) times daily as needed for itching or anxiety.   ibuprofen 600 MG tablet Commonly known as: ADVIL Take 1 tablet (600 mg total) by mouth every 6 (six) hours as needed for moderate pain or cramping.   oxyCODONE-acetaminophen 5-325 MG tablet Commonly known as: Percocet Take 1 tablet by mouth every 4 (four) hours as needed for up to 7 days.   prenatal multivitamin Tabs tablet Take 1 tablet by mouth daily. Gummies   promethazine 25 MG tablet Commonly known as: PHENERGAN Take 25 mg by mouth every 6 (six) hours as needed for vomiting or nausea.               Discharge Care Instructions  (From admission, onward)           Start     Ordered   07/25/22 0000  Discharge wound care:       Comments: Remove outer dressing on 07/27/22. May shower, pat dry. Keep incision clean and dry   07/25/22 0924             Discharge home in stable condition Infant Feeding: Bottle and Breast Infant Disposition:NICU Discharge instruction: per After Visit Summary and Postpartum booklet. Activity: Advance as tolerated. Pelvic rest for 6 weeks.  Diet: routine diet Anticipated Birth Control: Unsure Postpartum Appointment:6 weeks Additional Postpartum F/U: Postpartum Depression checkup and Incision check 2 weeks  Future Appointments:No future appointments. Follow up Visit:  Follow-up Information     Ob/Gyn, Nestor Ramp. Schedule an appointment as soon as possible for a visit.   Why: 2 weeks for post op visit and 6 weeks for postpartum visit Contact information: [address] [place] [address] [phone]                     07/25/2022 Cathrine Muster, DO

## 2022-07-25 NOTE — Discharge Instructions (Signed)
Call office with any concerns [phone] 

## 2022-07-25 NOTE — Progress Notes (Signed)
Subjective: Postpartum Day 3: Cesarean Delivery Patient reports incisional pain, tolerating PO, + flatus, and no problems voiding.  Pain "tolerable" today and significantly improved. She denies HA, fever, chills, SOB or CP. She has ambulated with no issues. Feels ready for discharge to home today.   Objective: Vital signs in last 24 hours: Temp:  [97.6 F (36.4 C)-98.4 F (36.9 C)] 97.9 F (36.6 C) (06/24 0812) Pulse Rate:  [73-104] 84 (06/24 0812) Resp:  [15-20] 18 (06/24 0812) BP: (119-147)/(68-94) 125/88 (06/24 0812) SpO2:  [93 %-100 %] 98 % (06/24 1610)  Physical Exam:  General: alert, cooperative, and no distress Lochia: appropriate Uterine Fundus: firm, abdomen distended, dressing c/d/i Incision: no significant drainage DVT Evaluation: No evidence of DVT seen on physical exam.  Recent Labs    07/24/22 0510 07/25/22 0425  HGB 6.8* 11.4*  HCT 21.0* 35.6*    Assessment/Plan: Status post Cesarean section. Doing well postoperatively.  Discharge home today. Incision check in 2 weeks and postpartum visit in 6 weeks.  Cathrine Muster, DO 07/25/2022, 9:15 AM

## 2022-07-25 NOTE — Progress Notes (Signed)
CSW attempted to meet with MOB at bedside in room 113.  When CSW arrived, CSW was informed that MOB was discharged.   CSW looked for parents at bedside to offer support and assess for needs, concerns, and resources; they were not present at this time.     CSW spoke with bedside nurse and no psychosocial stressors were identified.    CSW will continue to offer support and resources to family while infant remains in NICU.    Blaine Hamper, MSW, LCSW Clinical Social Work [phone]

## 2022-07-29 ENCOUNTER — Ambulatory Visit (HOSPITAL_COMMUNITY): Payer: Self-pay

## 2022-07-29 NOTE — Lactation Note (Signed)
This note was copied from a baby's chart.  NICU Lactation Consultation Note  Patient Name: Jill Evans [id number] Date: 07/29/2022 Age:32 days  Reason for consult: Weekly NICU follow-up; 1st time breastfeeding; NICU baby; Mother's request; RN request; Maternal endocrine disorder; Late-preterm 34-36.6wks Type of Endocrine Disorder?: Thyroid (hypothyroid)  SUBJECTIVE Visited with family of 2 82/50 weeks old AGA NICU female; Ms. Christen is a P2 and reports she hasn't pumped since she left the hospital on 07/25/2022. She voiced her breast are still "tender" and she can feel the knots but it's not as bad as it was two days ago. Provided ice packs and set up a DEBP in baby's room, provided a pumping top in size S/M for hands on pumping, assisted with this pumping sessions and she was able to get 10 ml. She said that the pump she had at home (got it at her baby shower) was dirty and she wasn't sure if she had the correct flange sizes or if it was missing pieces. Offered a [facility] loaner pump but she politely declined, instructed her how to use the hand pump in the mean time. Explained the importance of consistent pumping for the prevention of engorgement and to protect her supply and advised to contact the [facility] office ASAP to get a pump for home use since her insurance won't cover one.   OBJECTIVE Infant data: Mother's Current Feeding Choice: Breast Milk and Donor Milk  Infant feeding assessment Scale for Readiness: 3   Maternal data: G2P0202  C-Section, Low Transverse Pumping frequency: 1 times/24 hours during LC consult Pumped volume: 10 mL Flange Size: 21 Risk factor for low milk supply:: hypothyroid, infant separation, lack of/decreased pumping as on 07/29/2022  WIC Program: No WIC Referral Sent?: Yes What county?: [place] Pump: Personal (Mom unsure of brand presently)  ASSESSMENT Infant: Feeding Status: Scheduled 9-12-3-6  Maternal: Milk volume: Low No S/S of engorgement, but  breast are tender and have some knots, the started softening with pumping  INTERVENTIONS/PLAN Interventions: Interventions: Breast feeding basics reviewed; DEBP; Education; Ice Discharge Education: Engorgement and breast care Tools: Pump; Flanges; Coconut oil; Hands-free pumping top (Size S/M "Blue") Pump Education: Setup, frequency, and cleaning; Milk Storage  Plan: Encouraged pumping on maintenance mode every 3 hours, ideally 8 pumping sessions/24 hours She'll start icing her breast PRN She'll contact the [facility] office to get a DEBP for home use, she'll try using the hand pump in the meantime  FOB present and supportive. All questions and concerns answered, family to contact [facility] services PRN.   Consult Status: NICU follow-up NICU Follow-up type: Weekly NICU follow up   Tiarra Anastacio Venetia Constable 07/29/2022, 3:34 PM

## 2022-08-15 ENCOUNTER — Telehealth (HOSPITAL_COMMUNITY): Payer: Self-pay | Admitting: *Deleted

## 2022-08-15 ENCOUNTER — Ambulatory Visit (HOSPITAL_COMMUNITY): Payer: Self-pay

## 2022-08-15 NOTE — Telephone Encounter (Signed)
08/15/2022  Name: Jill Evans MRN: [id number] DOB: 05/01/1990  Reason for Call:  Transition of Care Hospital Discharge Call  Contact Status: Patient Contact Status: Complete  Language assistant needed:          Follow-Up Questions: Do You Have Any Concerns About Your Health As You Heal From Delivery?: No Do You Have Any Concerns About Your Infants Health?: Infant in NICU  Edinburgh Postnatal Depression Scale:  In the Past 7 Days: I have been able to laugh and see the funny side of things.: As much as I always could I have looked forward with enjoyment to things.: As much as I ever did I have blamed myself unnecessarily when things went wrong.: Not very often I have been anxious or worried for no good reason.: No, not at all I have felt scared or panicky for no good reason.: No, not at all Things have been getting on top of me.: No, I have been coping as well as ever I have been so unhappy that I have had difficulty sleeping.: Not at all I have felt sad or miserable.: No, not at all I have been so unhappy that I have been crying.: No, never The thought of harming myself has occurred to me.: Never Inocente Salles Postnatal Depression Scale Total: 1  PHQ2-9 Depression Scale:     Discharge Follow-up: Edinburgh score requires follow up?: No Patient was advised of the following resources:: Breastfeeding Support Group, Support Group  Post-discharge interventions: NA  Signature Deforest Hoyles, RN, [phone]

## 2022-08-15 NOTE — Lactation Note (Signed)
This note was copied from a baby's chart.  NICU Lactation Consultation Note  Patient Name: Jill Evans [id number] Date: 08/15/2022 Age:32 wk.o.  Reason for consult: Weekly NICU follow-up; Primapara; 1st time breastfeeding; NICU baby; Maternal endocrine disorder; Early term 69-38.6wks; Other (Comment) (Telephone call) Type of Endocrine Disorder?: Thyroid (hypothyroid)  SUBJECTIVE Visited with family of 57 49/72 weeks old AGA NICU female; Jill Evans was currently at home but MGM was visiting baby and reported Jill Evans is no longer pumping. Spoke to Jill Evans over the phone and she confirmed that she stopped pumping about 10 days ago; she felt discouraged because the last time she pumped she only got 5 ml; pumping was not consistent during hospital stay or post-discharge. She voiced her breasts got a bit tender but with the ibuprofen she's taking for the hematoma on her abdomen plus the cold wash clothes, they feel much better now. She denies any S/S of engorgement at this time. All questions and concerns answered, lactation services are completed at this time.  OBJECTIVE Infant data: Mother's Current Feeding Choice: Formula  Infant feeding assessment Scale for Readiness: 2   Maternal data: G2P0202 C-Section, Low Transverse No data recorded WIC Program: No WIC Referral Sent?: Yes What county?: [place] Pump: Personal (Mom unsure of brand presently)  ASSESSMENT Infant: Feeding Status: Scheduled 9-12-3-6  Maternal: No data recorded  INTERVENTIONS/PLAN Interventions: Interventions: Education Discharge Education: Engorgement and breast care  Plan: Consult Status: Complete   Cregg Jutte S Mylik Pro 08/15/2022, 12:32 PM

## 2023-02-02 ENCOUNTER — Ambulatory Visit: Payer: Commercial Managed Care - HMO | Admitting: Family

## 2023-02-02 ENCOUNTER — Encounter: Payer: Self-pay | Admitting: Family

## 2023-02-02 NOTE — Progress Notes (Deleted)
 Patient ID: Jill Evans, female    DOB: [birth year], 33 y.o.   MRN: [id number]  No chief complaint on file. baby 05/2022 -low iron , transfusion after deliver      Assessment & Plan:   Subjective:    Outpatient Medications Prior to Visit  Medication Sig Dispense Refill   cyclobenzaprine  (FLEXERIL ) 5 MG tablet Take 5 mg by mouth 3 (three) times daily as needed for muscle spasms.     hydrocortisone  cream 1 % Apply 1 Application topically 3 (three) times daily. 60 g 1   hydrOXYzine (ATARAX) 50 MG tablet Take 100 mg by mouth 2 (two) times daily as needed for itching or anxiety.     ibuprofen  (ADVIL ) 600 MG tablet Take 1 tablet (600 mg total) by mouth every 6 (six) hours as needed for moderate pain or cramping. 40 tablet 1   Prenatal Vit-Fe Fumarate-FA (PRENATAL MULTIVITAMIN) TABS tablet Take 1 tablet by mouth daily. Gummies     promethazine  (PHENERGAN ) 25 MG tablet Take 25 mg by mouth every 6 (six) hours as needed for vomiting or nausea.     No facility-administered medications prior to visit.   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   MCA 1.62, fetal ascites     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    Pregnancy induced hypertension    Previous cesarean delivery affecting pregnancy, antepartum 05/30/2022   Rh negative state in antepartum period 10/26/2017   [x]  rhogam  9/24   Severe preeclampsia, delivered 10/24/2017     Guidelines for Antenatal Testing and Sonography  (with updated ICD-10 codes)  Updated  Nov 09, 2016 INDICATION U/S NST/AFI or BPP wkly DELIVERY       CHTN - O10.919  Group I   BP < 140/90, no preeclampsia, AGA,  nml AFV, +/- meds    Group II   BP > 140/90, on meds, no preeclampsia, AGA, nml AFV  20-28-34-38  20-24-28-32-35-38  32//2 x wk  28//BPP wkly then 32//2 x wk or BPP wkly  40 no meds; 39 med   Supervision of high risk pregnancy due to social problems 10/26/2017   Wanting to give up for adoption ([employer])   Supervision of high risk pregnancy, antepartum 10/10/2017    Nursing Staff Provider  Office Location  [place] Dating   third trimester ultrasound  Language   English Anatomy US     Flu Vaccine  10/10/17 Genetic Screen  NIPS:   AFP:   First Screen:  Quad:    TDaP vaccine   10/10/17 Hgb A1C or  GTT Early  Third trimester ha1c 5.1  Rhogam   10-25-17   LAB RESULTS   Feeding Plan  Blood Type --/--/B NEG (09/28 1322) B negative  Contraception  Antibody POS (09/28 132   Vitamin D deficiency 06/30/2015   Past Surgical History:  Procedure Laterality Date  CESAREAN SECTION N/A 11/06/2017   Procedure: CESAREAN SECTION;  Surgeon: Nicholaus Burnard HERO, MD;  Location: [facility] BIRTHING SUITES;  Service: Obstetrics;  Laterality: N/A;   CESAREAN SECTION N/A 07/22/2022   Procedure: CESAREAN SECTION;  Surgeon: Diedre Rosaline BRAVO, MD;  Location: [facility] LD ORS;  Service: Obstetrics;  Laterality: N/A;  delivery at 35 weeks per MFM   mirena      inserted 2019   No Known Allergies    Objective:    Physical Exam Vitals and nursing note reviewed.  Constitutional:      Appearance: Normal appearance.  Cardiovascular:     Rate and Rhythm: Normal rate and regular rhythm.  Pulmonary:     Effort: Pulmonary effort is normal.     Breath sounds: Normal breath sounds.  Musculoskeletal:        General: Normal range of motion.  Skin:    General: Skin is warm and dry.   Neurological:     Mental Status: She is alert.  Psychiatric:        Mood and Affect: Mood normal.        Behavior: Behavior normal.    There were no vitals taken for this visit. Wt Readings from Last 3 Encounters:  07/22/22 183 lb (83 kg)  07/15/22 185 lb (83.9 kg)  12/13/21 165 lb (74.8 kg)       Lucius Krabbe, NP

## 2023-03-09 ENCOUNTER — Ambulatory Visit (INDEPENDENT_AMBULATORY_CARE_PROVIDER_SITE_OTHER): Payer: Commercial Managed Care - HMO | Admitting: Family

## 2023-03-09 ENCOUNTER — Encounter: Payer: Self-pay | Admitting: Family

## 2023-03-09 VITALS — BP 110/77 | HR 70 | Temp 97.2°F | Ht 59.0 in | Wt 175.8 lb

## 2023-03-09 DIAGNOSIS — E0590 Thyrotoxicosis, unspecified without thyrotoxic crisis or storm: Secondary | ICD-10-CM

## 2023-03-09 DIAGNOSIS — M25511 Pain in right shoulder: Secondary | ICD-10-CM | POA: Diagnosis not present

## 2023-03-09 DIAGNOSIS — R6889 Other general symptoms and signs: Secondary | ICD-10-CM | POA: Diagnosis not present

## 2023-03-09 DIAGNOSIS — R5383 Other fatigue: Secondary | ICD-10-CM

## 2023-03-09 NOTE — Progress Notes (Signed)
 Patient ID: Jill Evans, female    DOB: [birth year], 33 y.o.   MRN: [id number]  Chief Complaint  Patient presents with   Fatigue    Pt c/o fatigue and constantly cold. Pt states she was anemic after giving birth to her daughter a couple of months ago.    acute pain of right shoulder    Pt would like a new referral for PT.      Discussed the use of AI scribe software for clinical note transcription with the patient, who gave verbal consent to proceed.  History of Present Illness   Jill Evans is a 33 year old female who presents with persistent cold intolerance and fatigue. She experiences persistent cold intolerance and fatigue, which she attributes to her postpartum anemia. She has not been on iron  supplements due to uncertainty about the dosage. She experiences constant coldness, even during warmer months, and craves ice when her iron  levels are low. She has not had a menstrual cycle since giving birth, which she attributes to her use of Nexplanon, started about a month and a half postpartum. She was diagnosed with anemia after childbirth, requiring two blood transfusions. Despite this, she has not initiated iron  supplementation. Her fatigue is compounded by the demands of raising her 65.5-month-old daughter. She has a history of a high thyroid  reading during her first pregnancy, which normalized post-pregnancy and was not followed up. Current symptoms of fatigue and cold intolerance raise concerns about possible thyroid  dysfunction. She has a history of shoulder issues and attended physical therapy for four to five sessions before stopping due to pregnancy. Her hand has started to 'freeze' due to arthritis, making it difficult to hold objects and impacting her ability to carry her daughter. Insurance does not cover physical therapy, posing a financial challenge. She has gained weight since having her daughter and starting Nexplanon, which she finds concerning.     Assessment &  Plan:     Postpartum Anemia - History of anemia postpartum with two blood transfusions. Currently experiencing cold intolerance, fatigue, and difficulty losing weight. No iron  supplementation initiated postpartum. -Order labs to check hemoglobin and iron  stores.  Right Shoulder pain - History of shoulder arthritis with previous physical therapy sessions. Currently experiencing freezing of the hand and difficulty carrying her child due to pain. -Referral to physical therapy for further management.  Fatigue & cold intolerance -  She has a history of a high thyroid  reading during her first pregnancy, which normalized post-pregnancy and was not followed up. Current symptoms of fatigue and cold intolerance raise concerns about possible thyroid  dysfunction. -Check labs today including thyroid  -F/U prn   Subjective:    Outpatient Medications Prior to Visit  Medication Sig Dispense Refill   hydrocortisone  cream 1 % Apply 1 Application topically 3 (three) times daily. 60 g 1   Prenatal Vit-Fe Fumarate-FA (PRENATAL MULTIVITAMIN) TABS tablet Take 1 tablet by mouth daily. Gummies     promethazine  (PHENERGAN ) 25 MG tablet Take 25 mg by mouth every 6 (six) hours as needed for vomiting or nausea.     cyclobenzaprine  (FLEXERIL ) 5 MG tablet Take 5 mg by mouth 3 (three) times daily as needed for muscle spasms. (Patient not taking: Reported on 03/09/2023)     hydrOXYzine (ATARAX) 50 MG tablet Take 100 mg by mouth 2 (two) times daily as needed for itching or anxiety. (Patient not taking: Reported on 03/09/2023)     ibuprofen  (ADVIL ) 600 MG tablet Take 1 tablet (600 mg  total) by mouth every 6 (six) hours as needed for moderate pain or cramping. (Patient not taking: Reported on 03/09/2023) 40 tablet 1   No facility-administered medications prior to visit.   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   MCA 1.62, fetal ascites     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    Pregnancy induced hypertension    Previous cesarean delivery affecting pregnancy, antepartum 05/30/2022   Rh negative state in antepartum period 10/26/2017   [x]  rhogam 9/24   Severe preeclampsia, delivered 10/24/2017     Guidelines for Antenatal Testing and Sonography  (with updated ICD-10 codes)  Updated  10-31-16 INDICATION U/S NST/AFI or BPP wkly DELIVERY       CHTN - O10.919  Group I   BP < 140/90, no preeclampsia, AGA,  nml AFV, +/- meds    Group II   BP > 140/90, on meds, no preeclampsia, AGA, nml AFV  20-28-34-38  20-24-28-32-35-38  32//2 x wk  28//BPP wkly then 32//2 x wk or BPP wkly  40 no meds; 39 med   Supervision of high risk pregnancy due to social problems 10/26/2017   Wanting to give up for adoption ([employer])   Supervision of high risk pregnancy, antepartum 10/10/2017    Nursing Staff Provider  Office Location  [place] Dating   third trimester ultrasound  Language   English Anatomy US     Flu Vaccine  10/10/17 Genetic Screen  NIPS:   AFP:   First Screen:  Quad:    TDaP vaccine   10/10/17 Hgb A1C or  GTT Early  Third trimester ha1c 5.1  Rhogam   10-25-17   LAB RESULTS   Feeding Plan  Blood Type --/--/B NEG (09/28 1322) B negative  Contraception  Antibody POS (09/28 132   Vitamin D deficiency 06/30/2015   Past Surgical History:  Procedure Laterality Date   CESAREAN SECTION N/A 11/06/2017   Procedure: CESAREAN  SECTION;  Surgeon: Nicholaus Burnard HERO, MD;  Location: [facility] BIRTHING SUITES;  Service: Obstetrics;  Laterality: N/A;   CESAREAN SECTION N/A 07/22/2022   Procedure: CESAREAN SECTION;  Surgeon: Diedre Rosaline BRAVO, MD;  Location: [facility] LD ORS;  Service: Obstetrics;  Laterality: N/A;  delivery at 35 weeks per MFM   mirena      inserted 2019   No Known Allergies    Objective:    Physical Exam Vitals and nursing note reviewed.  Constitutional:      Appearance: Normal appearance. She is obese.  Cardiovascular:     Rate and Rhythm: Normal rate and regular rhythm.  Pulmonary:     Effort: Pulmonary effort is normal.     Breath sounds: Normal breath sounds.  Musculoskeletal:        General: Normal range of motion.  Skin:    General: Skin is warm and dry.  Neurological:  Mental Status: She is alert.  Psychiatric:        Mood and Affect: Mood normal.        Behavior: Behavior normal.    BP 110/77 (BP Location: Left Arm, Patient Position: Sitting, Cuff Size: Normal)   Pulse 70   Temp (!) 97.2 F (36.2 C) (Temporal)   Ht 4' 11 (1.499 m)   Wt 175 lb 12.8 oz (79.7 kg)   SpO2 96%   Breastfeeding No   BMI 35.51 kg/m  Wt Readings from Last 3 Encounters:  03/09/23 175 lb 12.8 oz (79.7 kg)  07/22/22 183 lb (83 kg)  07/15/22 185 lb (83.9 kg)       Lucius Krabbe, NP

## 2023-03-10 LAB — CBC
HCT: 39.1 % (ref 36.0–46.0)
Hemoglobin: 13.1 g/dL (ref 12.0–15.0)
MCHC: 33.6 g/dL (ref 30.0–36.0)
MCV: 93.1 fL (ref 78.0–100.0)
Platelets: 374 10*3/uL (ref 150.0–400.0)
RBC: 4.19 Mil/uL (ref 3.87–5.11)
RDW: 13.3 % (ref 11.5–15.5)
WBC: 9.2 10*3/uL (ref 4.0–10.5)

## 2023-03-10 LAB — FERRITIN: Ferritin: 48.8 ng/mL (ref 10.0–291.0)

## 2023-03-10 LAB — TSH: TSH: 91.22 u[IU]/mL — ABNORMAL HIGH (ref 0.35–5.50)

## 2023-03-10 LAB — T4, FREE: Free T4: 0.3 ng/dL — ABNORMAL LOW (ref 0.60–1.60)

## 2023-03-12 ENCOUNTER — Encounter: Payer: Self-pay | Admitting: Family

## 2023-03-14 ENCOUNTER — Other Ambulatory Visit: Payer: Self-pay

## 2023-03-14 ENCOUNTER — Ambulatory Visit: Payer: Commercial Managed Care - HMO | Attending: Family

## 2023-03-14 DIAGNOSIS — M25511 Pain in right shoulder: Secondary | ICD-10-CM | POA: Diagnosis present

## 2023-03-14 DIAGNOSIS — M6281 Muscle weakness (generalized): Secondary | ICD-10-CM | POA: Diagnosis present

## 2023-03-14 DIAGNOSIS — R252 Cramp and spasm: Secondary | ICD-10-CM | POA: Diagnosis present

## 2023-03-14 DIAGNOSIS — M25611 Stiffness of right shoulder, not elsewhere classified: Secondary | ICD-10-CM | POA: Insufficient documentation

## 2023-03-14 NOTE — Therapy (Signed)
OUTPATIENT PHYSICAL THERAPY UPPER EXTREMITY EVALUATION   Patient Name: Jill Evans MRN: [id number] DOB:[birth year], 33 y.o., female Today's Date: 03/14/2023  END OF SESSION:  PT End of Session - 03/14/23 1030     Visit Number 1    Date for PT Re-Evaluation 05/09/23    Authorization Type [employer] connect    PT Start Time 1030    PT Stop Time 1100    PT Time Calculation (min) 30 min    Activity Tolerance Patient limited by pain    Behavior During Therapy [facility] for tasks assessed/performed             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   MCA 1.62, fetal ascites     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    Pregnancy induced hypertension    Previous cesarean delivery affecting pregnancy, antepartum 05/30/2022   Rh negative state in antepartum period 10/26/2017   [x]  rhogam 9/24   Severe preeclampsia, delivered 10/24/2017     Guidelines for Antenatal Testing and Sonography  (with updated ICD-10 codes)  Updated  11-06-16 INDICATION U/S NST/AFI or BPP wkly DELIVERY       CHTN - O10.919  Group I   BP < 140/90, no preeclampsia, AGA,  nml AFV, +/- meds    Group II   BP > 140/90, on meds, no preeclampsia, AGA, nml AFV  20-28-34-38  20-24-28-32-35-38  32//2 x wk  28//BPP wkly then 32//2 x wk or BPP wkly  40 no meds; 39 med   Supervision of high risk pregnancy due to  social problems 10/26/2017   Wanting to give up for adoption ([employer])   Supervision of high risk pregnancy, antepartum 10/10/2017    Nursing Staff Provider  Office Location  [place] Dating   third trimester ultrasound  Language   English Anatomy US    Flu Vaccine  10/10/17 Genetic Screen  NIPS:   AFP:   First Screen:  Quad:    TDaP vaccine   10/10/17 Hgb A1C or  GTT Early  Third trimester ha1c 5.1  Rhogam   10-25-17   LAB RESULTS   Feeding Plan  Blood Type --/--/B NEG (09/28 1322) B negative  Contraception  Antibody POS (09/28 132   Vitamin D deficiency 06/30/2015   Past Surgical History:  Procedure Laterality Date   CESAREAN SECTION N/A 11/06/2017   Procedure: CESAREAN SECTION;  Surgeon: Conan Bowens, MD;  Location: [facility] BIRTHING SUITES;  Service: Obstetrics;  Laterality: N/A;   CESAREAN SECTION N/A 07/22/2022   Procedure: CESAREAN SECTION;  Surgeon: [name], MD;  Location: [facility] LD ORS;  Service: Obstetrics;  Laterality: N/A;  delivery at 35 weeks per MFM   mirena     inserted 2019   Patient Active Problem List   Diagnosis Date Noted   History of cesarean delivery 07/22/2022  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   BMI 30s 10/26/2017   Hypothyroidism     PCP: Tollie Pizza   REFERRING PROVIDER: Dulce Sellar, Dorris Carnes   REFERRING DIAG: M25.511 (ICD-10-CM) - Acute pain of right shoulder  THERAPY DIAG:  Acute pain of right shoulder - Plan: PT plan of care cert/re-cert  Stiffness of right shoulder, not elsewhere classified - Plan: PT plan of care cert/re-cert  Muscle weakness (generalized) - Plan: PT plan of care cert/re-cert  Cramp and spasm - Plan: PT plan of care cert/re-cert  Rationale for Evaluation and Treatment: Rehabilitation  ONSET DATE: 03/09/2023  SUBJECTIVE:                                                                                                                                                                                       SUBJECTIVE STATEMENT: I originally was injured about 2 years ago.   I was doing cut fruit at a grocery store and also playing with my kids.  Initial xray said "I threw my arm out".  Pain originally in my shoulder blade.  I tried to work through the pain.  I went back to MD several times but I realized I was pregnant.  I could not do surgery.  Sent to PT but could not keep her appointments.  She has trouble scheduling due to caring for her daughter and her partners schedule.   They said I have nerve damage.  Can't lift my arm higher than my breast in a standing position.  I have trouble holding my daughter.  My hand goes numb and my shoulder blade is "on fire".   I take Ibuprofen 600 mg.  Its mostly just numbness.  Unable to work since initial pain started.  Feels she has nerve damage.   Hand dominance: Right  PERTINENT HISTORY: Chromosomal disorder  PAIN:  Are you having pain? Yes: NPRS scale: 6-8/10 Pain location: right shoulder, neck  Pain description: numbness and pain Aggravating factors: sleeping  Relieving factors: rest, meds  PRECAUTIONS: None  RED FLAGS: None   WEIGHT BEARING RESTRICTIONS: No  FALLS:  Has patient fallen in last 6 months? No  LIVING ENVIRONMENT: Lives with: lives with their family Lives in: House/apartment   OCCUPATION: Not working  PLOF: Independent, Independent with basic ADLs, Independent with household mobility without device, Independent with community mobility without device, Independent with homemaking with ambulation, Independent with gait, Independent with transfers, Vocation/Vocational requirements: not working, and Leisure: beadwork but no dexterity due to fingertips numb, reading, music  PATIENT GOALS: To get motion back in my shoulder and figure out the nerve damage.    NEXT MD VISIT:  prn  OBJECTIVE:  Note: Objective measures were completed at Evaluation unless otherwise noted.  DIAGNOSTIC FINDINGS:  Only see xrays in chart, no  report  PATIENT SURVEYS :  Quick Dash 89%  COGNITION: Overall cognitive status: Within functional limits for tasks assessed     SENSATION: WFL  POSTURE: Rounded shoulders, shortened c spine  UPPER EXTREMITY ROM:   Active ROM Right eval Left eval  Shoulder flexion 90 wnl  Shoulder extension    Shoulder abduction 80 wnl  Shoulder adduction    Shoulder internal rotation Thumb to hip wnl  Shoulder external rotation Thumb to side of head wnl  Elbow flexion    Elbow extension    Wrist flexion    Wrist extension    Wrist ulnar deviation    Wrist radial deviation    Wrist pronation    Wrist supination    (Blank rows = not tested)  UPPER EXTREMITY MMT: (effort minimal due to reported pain, inconclusive)   SHOULDER SPECIAL TESTS: Impingement tests: Neer impingement test: positive  and Painful arc test: positive  SLAP lesions: Clunk test: negative Instability tests: Apprehension test: negative Rotator cuff assessment: Drop arm test: negative and Empty can test: negative Biceps assessment: Speed's test: negative  JOINT MOBILITY TESTING:  Normal  PALPATION:  Tender all about the shoulder and parascapular area                                                                                                                             TREATMENT DATE: 03/14/23 Initial assessment completed and initiated HEP   PATIENT EDUCATION: Education details: Initiated HEP Person educated: Patient Education method: Programmer, multimedia, Facilities manager, Verbal cues, and Handouts Education comprehension: verbalized understanding, returned demonstration, and verbal cues required  HOME EXERCISE PROGRAM: Deferred, time constraints due to patient late for appt  ASSESSMENT:  CLINICAL IMPRESSION: Patient is a 33 y.o. female who was seen today for physical therapy evaluation and treatment for right shoulder pain.  Her initial pain started over 1 year ago.  She was referred to PT but had difficulty  attending her appts.  She had xrays only at that point when she found out she was pregnant.  She held on any further treatment until now due to trying to raise her daughter.  Her daughter is 70 months old now.  She presents with decreased ROM, strength and function along with elevated pain.  She does show some signs of impingement.  Active ROM is quite limited vs PROM.  She would benefit from skilled PT for right shoulder ROM, shoulder strengthening and scapular stabilization, functional strengthening and pain control.  We may also address her spasm and trigger points with dry needling.     OBJECTIVE IMPAIRMENTS: decreased ROM, decreased strength, hypomobility, increased fascial restrictions, increased muscle spasms, impaired UE functional use, and pain.   ACTIVITY LIMITATIONS: carrying, lifting, sleeping, transfers, bed mobility, bathing, dressing, reach over head, hygiene/grooming, and caring for others  PARTICIPATION LIMITATIONS: meal  prep, cleaning, laundry, driving, shopping, community activity, occupation, yard work, and church  PERSONAL FACTORS: Fitness and 1-2 comorbidities: anxiety, hyperglycemia  are also affecting patient's functional outcome.   REHAB POTENTIAL: Good  CLINICAL DECISION MAKING: Stable/uncomplicated  EVALUATION COMPLEXITY: Low  GOALS: Goals reviewed with patient? Yes  SHORT TERM GOALS: Target date: 04/11/2023   Pain report to be no greater than 4/10  Baseline: Goal status: INITIAL  2.  Patient will be independent with initial HEP  Baseline:  Goal status: INITIAL   LONG TERM GOALS: Target date: 05/09/2023   Patient to report pain no greater than 2/10  Baseline:  Goal status: INITIAL  2.  Patient to be independent with advanced HEP  Baseline:  Goal status: INITIAL  3.  Quick Dash to improve by 4 points Baseline:  Goal status: INITIAL  4.  Patient will be able to reach overhead into cabinets and on top of shelves without pain  Baseline:  Goal  status: INITIAL  5.  Patient to be able to sleep through the night  Baseline:  Goal status: INITIAL  6.  Patient will be able to perform her usual daily household tasks without exacerbation of right shoulder pain Baseline:  Goal status: INITIAL  PLAN: PT FREQUENCY: 1-2x/week  PT DURATION: 8 weeks  PLANNED INTERVENTIONS: 97110-Therapeutic exercises, 97530- Therapeutic activity, 97112- Neuromuscular re-education, 97535- Self Care, 81191- Manual therapy, [id number]- Canalith repositioning, [id number]- Aquatic Therapy, 97760- Splinting, [id number]- Subsequent splinting/medication, 97014- Electrical stimulation (unattended), [id number]- Electrical stimulation (manual), [id number]- Vasopneumatic device, [id number]- Ultrasound, [id number]- Traction (mechanical), [id number]- Ionotophoresis 4mg /ml Dexamethasone, Patient/Family education, Taping, Dry Needling, Vestibular training, Visual/preceptual remediation/compensation, Cryotherapy, and Moist heat  PLAN FOR NEXT SESSION: initiate HEP, UBE, begin prone shoulder and scapular stabilization, postural training.     Victorino Dike B. Parnika Tweten, PT 03/14/23 9:20 PM [facility] [address], [address] [place], [place] [address] Phone # [phone] Fax [phone]

## 2023-03-19 NOTE — Therapy (Incomplete)
OUTPATIENT PHYSICAL THERAPY UPPER EXTREMITY TREATMENT   Patient Name: Jill Evans MRN: [id number] DOB:09/30/1990, 33 y.o., female Today's Date: 03/19/2023  END OF SESSION:    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   MCA 1.62, fetal ascites     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    Pregnancy induced hypertension    Previous cesarean delivery affecting pregnancy, antepartum 05/30/2022   Rh negative state in antepartum period 10/26/2017   [x]  rhogam 9/24   Severe preeclampsia, delivered 10/24/2017     Guidelines for Antenatal Testing and Sonography  (with updated ICD-10 codes)  Updated  2016/10/21 INDICATION U/S NST/AFI or BPP wkly DELIVERY       CHTN - O10.919  Group I   BP < 140/90, no preeclampsia, AGA,  nml AFV, +/- meds    Group II   BP > 140/90, on meds, no preeclampsia, AGA, nml AFV  20-28-34-38  20-24-28-32-35-38  32//2 x wk  28//BPP wkly then 32//2 x wk or BPP wkly  40 no meds; 39 med   Supervision of high risk pregnancy due to social problems 10/26/2017   Wanting to give up for adoption ([employer])   Supervision of high risk pregnancy, antepartum 10/10/2017    Nursing Staff Provider  Office Location  [place] Dating   third trimester ultrasound  Language   English Anatomy US    Flu Vaccine  10/10/17 Genetic Screen  NIPS:   AFP:   First Screen:  Quad:     TDaP vaccine   10/10/17 Hgb A1C or  GTT Early  Third trimester ha1c 5.1  Rhogam   10-25-17   LAB RESULTS   Feeding Plan  Blood Type --/--/B NEG (09/28 1322) B negative  Contraception  Antibody POS (09/28 132   Vitamin D deficiency 06/30/2015   Past Surgical History:  Procedure Laterality Date   CESAREAN SECTION N/A 11/06/2017   Procedure: CESAREAN SECTION;  Surgeon: Conan Bowens, MD;  Location: [facility] BIRTHING SUITES;  Service: Obstetrics;  Laterality: N/A;   CESAREAN SECTION N/A 07/22/2022   Procedure: CESAREAN SECTION;  Surgeon: [name], MD;  Location: [facility] LD ORS;  Service: Obstetrics;  Laterality: N/A;  delivery at 35 weeks per MFM   mirena     inserted 2019   Patient Active Problem List   Diagnosis Date Noted   History of cesarean delivery 07/22/2022   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  Rationale for Evaluation and Treatment: Rehabilitation  ONSET DATE: 03/09/2023  SUBJECTIVE:  SUBJECTIVE STATEMENT: ***  ***I originally was injured about 2 years ago.   I was doing cut fruit at a grocery store and also playing with my kids.  Initial xray said "I threw my arm out".  Pain originally in my shoulder blade.  I tried to work through the pain.  I went back to MD several times but I realized I was pregnant.  I could not do surgery.  Sent to PT but could not keep her appointments.  She has trouble scheduling due to caring for her daughter and her partners schedule.   They said I have nerve damage.  Can't lift my arm higher than my breast in a standing position.  I have  trouble holding my daughter.  My hand goes numb and my shoulder blade is "on fire".   I take Ibuprofen 600 mg.  Its mostly just numbness.  Unable to work since initial pain started.  Feels she has nerve damage.   Hand dominance: Right  PERTINENT HISTORY: Chromosomal disorder  PAIN:  Are you having pain? Yes: NPRS scale: 6-8/10 Pain location: right shoulder, neck  Pain description: numbness and pain Aggravating factors: sleeping  Relieving factors: rest, meds  PRECAUTIONS: None  RED FLAGS: None   WEIGHT BEARING RESTRICTIONS: No  FALLS:  Has patient fallen in last 6 months? No  LIVING ENVIRONMENT: Lives with: lives with their family Lives in: House/apartment   OCCUPATION: Not working  PLOF: Independent, Independent with basic ADLs, Independent with household mobility without device, Independent with community mobility without device, Independent with homemaking with ambulation, Independent with gait, Independent with transfers, Vocation/Vocational requirements: not working, and Leisure: beadwork but no dexterity due to fingertips numb, reading, music  PATIENT GOALS: To get motion back in my shoulder and figure out the nerve damage.    NEXT MD VISIT: prn  OBJECTIVE:  Note: Objective measures were completed at Evaluation unless otherwise noted.  DIAGNOSTIC FINDINGS:  Only see xrays in chart, no report  PATIENT SURVEYS :  Quick Dash 89%  COGNITION: Overall cognitive status: Within functional limits for tasks assessed     SENSATION: WFL  POSTURE: Rounded shoulders, shortened c spine  UPPER EXTREMITY ROM:   Active ROM Right eval Left eval  Shoulder flexion 90 wnl  Shoulder extension    Shoulder abduction 80 wnl  Shoulder adduction    Shoulder internal rotation Thumb to hip wnl  Shoulder external rotation Thumb to side of head wnl  Elbow flexion    Elbow extension    Wrist flexion    Wrist extension    Wrist ulnar deviation    Wrist radial deviation     Wrist pronation    Wrist supination    (Blank rows = not tested)  UPPER EXTREMITY MMT: (effort minimal due to reported pain, inconclusive)   SHOULDER SPECIAL TESTS: Impingement tests: Neer impingement test: positive  and Painful arc test: positive  SLAP lesions: Clunk test: negative Instability tests: Apprehension test: negative Rotator cuff assessment: Drop arm test: negative and Empty can test: negative Biceps assessment: Speed's test: negative  JOINT MOBILITY TESTING:  Normal  PALPATION:  Tender all about the shoulder and parascapular area  TREATMENT DATE:  03/20/23 [place]   03/14/23 Initial assessment completed and initiated HEP   PATIENT EDUCATION: Education details: Initiated HEP Person educated: Patient Education method: Programmer, multimedia, Facilities manager, Verbal cues, and Handouts Education comprehension: verbalized understanding, returned demonstration, and verbal cues required  HOME EXERCISE PROGRAM: *** Deferred, time constraints due to patient late for appt  ASSESSMENT:  CLINICAL IMPRESSION: ***  Eval: Patient is a 33 y.o. female who was seen today for physical therapy evaluation and treatment for right shoulder pain.  Her initial pain started over 1 year ago.  She was referred to PT but had difficulty attending her appts.  She had xrays only at that point when she found out she was pregnant.  She held on any further treatment until now due to trying to raise her daughter.  Her daughter is 51 months old now.  She presents with decreased ROM, strength and function along with elevated pain.  She does show some signs of impingement.  Active ROM is quite limited vs PROM.  She would benefit from skilled PT for right shoulder ROM, shoulder strengthening and scapular stabilization, functional strengthening and pain control.  We may also address her spasm  and trigger points with dry needling.     OBJECTIVE IMPAIRMENTS: decreased ROM, decreased strength, hypomobility, increased fascial restrictions, increased muscle spasms, impaired UE functional use, and pain.   ACTIVITY LIMITATIONS: carrying, lifting, sleeping, transfers, bed mobility, bathing, dressing, reach over head, hygiene/grooming, and caring for others  PARTICIPATION LIMITATIONS: meal prep, cleaning, laundry, driving, shopping, community activity, occupation, yard work, and church  PERSONAL FACTORS: Fitness and 1-2 comorbidities: anxiety, hyperglycemia  are also affecting patient's functional outcome.   REHAB POTENTIAL: Good  CLINICAL DECISION MAKING: Stable/uncomplicated  EVALUATION COMPLEXITY: Low  GOALS: Goals reviewed with patient? Yes  SHORT TERM GOALS: Target date: 04/11/2023   Pain report to be no greater than 4/10  Baseline: Goal status: INITIAL  2.  Patient will be independent with initial HEP  Baseline:  Goal status: INITIAL   LONG TERM GOALS: Target date: 05/09/2023   Patient to report pain no greater than 2/10  Baseline:  Goal status: INITIAL  2.  Patient to be independent with advanced HEP  Baseline:  Goal status: INITIAL  3.  Quick Dash to improve by 4 points Baseline:  Goal status: INITIAL  4.  Patient will be able to reach overhead into cabinets and on top of shelves without pain  Baseline:  Goal status: INITIAL  5.  Patient to be able to sleep through the night  Baseline:  Goal status: INITIAL  6.  Patient will be able to perform her usual daily household tasks without exacerbation of right shoulder pain Baseline:  Goal status: INITIAL  PLAN: PT FREQUENCY: 1-2x/week  PT DURATION: 8 weeks  PLANNED INTERVENTIONS: 97110-Therapeutic exercises, 97530- Therapeutic activity, 97112- Neuromuscular re-education, 97535- Self Care, 21308- Manual therapy, [id number]- Canalith repositioning, [id number]- Aquatic Therapy, 97760- Splinting, [id number]- Subsequent  splinting/medication, 97014- Electrical stimulation (unattended), [id number]- Electrical stimulation (manual), [id number]- Vasopneumatic device, [id number]- Ultrasound, [id number]- Traction (mechanical), [id number]- Ionotophoresis 4mg /ml Dexamethasone, Patient/Family education, Taping, Dry Needling, Vestibular training, Visual/preceptual remediation/compensation, Cryotherapy, and Moist heat  PLAN FOR NEXT SESSION: initiate HEP, UBE, begin prone shoulder and scapular stabilization, postural training.     Solon Palm, PT  03/19/23 5:20 PM [facility] [address] [place], [place] [address] Phone # [phone] Fax [phone]

## 2023-03-20 ENCOUNTER — Telehealth: Payer: Self-pay | Admitting: Physical Therapy

## 2023-03-20 ENCOUNTER — Ambulatory Visit: Payer: Commercial Managed Care - HMO | Admitting: Physical Therapy

## 2023-03-20 NOTE — Telephone Encounter (Signed)
Phoned pt regarding missed appointment. Voice mailbox is not set up, so no message left.

## 2023-03-31 ENCOUNTER — Encounter: Payer: Commercial Managed Care - HMO | Admitting: Physical Therapy

## 2023-06-05 ENCOUNTER — Telehealth: Payer: Self-pay | Admitting: *Deleted

## 2023-06-05 ENCOUNTER — Ambulatory Visit: Admitting: Family

## 2023-06-05 NOTE — Telephone Encounter (Signed)
 Copied from CRM [id number]. Topic: Referral - Request for Referral >> Jun 05, 2023  9:45 AM Jenice Mitts wrote: Did the patient discuss referral with their provider in the last year? Yes (If No - schedule appointment) (If Yes - send message)  Appointment offered? Yes  Type of order/referral and detailed reason for visit: Hyperthyroidism, endocrinologist   Preference of office, provider, location: would like recommendation  If referral order, have you been seen by this specialty before? No (If Yes, this issue or another issue? When? Where?  Can we respond through MyChart? Yes   Ok to place a referral to endo?

## 2023-06-05 NOTE — Telephone Encounter (Signed)
 I called pt, unable to leave vm due to vm box being full. Pt was scheduled but appointment was cancelled.

## 2023-06-05 NOTE — Progress Notes (Deleted)
 Patient ID: Jill Evans, female    DOB: Jun 26, 1990, 33 y.o.   MRN: [id number]  No chief complaint on file.       Subjective:    Outpatient Medications Prior to Visit  Medication Sig Dispense Refill   cyclobenzaprine  (FLEXERIL ) 5 MG tablet Take 5 mg by mouth 3 (three) times daily as needed for muscle spasms.     hydrocortisone  cream 1 % Apply 1 Application topically 3 (three) times daily. 60 g 1   Prenatal Vit-Fe Fumarate-FA (PRENATAL MULTIVITAMIN) TABS tablet Take 1 tablet by mouth daily. Gummies     promethazine  (PHENERGAN ) 25 MG tablet Take 25 mg by mouth every 6 (six) hours as needed for vomiting or nausea.     No facility-administered medications prior to visit.   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   MCA 1.62, fetal ascites     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    Pregnancy induced hypertension    Previous cesarean delivery affecting pregnancy, antepartum 05/30/2022   Rh negative state in antepartum period 10/26/2017   [x]  rhogam 9/24   Severe preeclampsia, delivered 10/24/2017     Guidelines for Antenatal Testing and Sonography  (with updated ICD-10 codes)  Updated  11-03-16 INDICATION U/S NST/AFI or BPP wkly DELIVERY       CHTN - O10.919  Group I   BP < 140/90, no preeclampsia, AGA,  nml AFV, +/- meds    Group II   BP > 140/90, on meds, no preeclampsia,  AGA, nml AFV  20-28-34-38  20-24-28-32-35-38  32//2 x wk  28//BPP wkly then 32//2 x wk or BPP wkly  40 no meds; 39 med   Supervision of high risk pregnancy due to social problems 10/26/2017   Wanting to give up for adoption ([employer])   Supervision of high risk pregnancy, antepartum 10/10/2017    Nursing Staff Provider  Office Location  [place] Dating   third trimester ultrasound  Language   English Anatomy US     Flu Vaccine  10/10/17 Genetic Screen  NIPS:   AFP:   First Screen:  Quad:    TDaP vaccine   10/10/17 Hgb A1C or  GTT Early  Third trimester ha1c 5.1  Rhogam   10-25-17   LAB RESULTS   Feeding Plan  Blood Type --/--/B NEG (09/28 1322) B negative  Contraception  Antibody POS (09/28 132   Vitamin D deficiency 06/30/2015   Past Surgical History:  Procedure Laterality Date   CESAREAN SECTION N/A 11/06/2017   Procedure: CESAREAN SECTION;  Surgeon: Jan Mcgill, MD;  Location: [facility] BIRTHING SUITES;  Service: Obstetrics;  Laterality: N/A;   CESAREAN SECTION N/A 07/22/2022   Procedure: CESAREAN SECTION;  Surgeon: Theodoro Fisherman, MD;  Location: [facility] LD ORS;  Service: Obstetrics;  Laterality: N/A;  delivery at 35 weeks per MFM  mirena      inserted 2019   No Known Allergies    Objective:    Physical Exam Vitals and nursing note reviewed.  Constitutional:      Appearance: Normal appearance.  Cardiovascular:     Rate and Rhythm: Normal rate and regular rhythm.  Pulmonary:     Effort: Pulmonary effort is normal.     Breath sounds: Normal breath sounds.  Musculoskeletal:        General: Normal range of motion.  Skin:    General: Skin is warm and dry.  Neurological:     Mental Status: She is alert.  Psychiatric:        Mood and Affect: Mood normal.        Behavior: Behavior normal.    There were no vitals taken for this visit. Wt Readings from Last 3 Encounters:  03/09/23 175 lb 12.8 oz (79.7 kg)  07/22/22 183 lb (83 kg)  07/15/22 185 lb (83.9 kg)       Versa Gore, NP

## 2023-06-05 NOTE — Telephone Encounter (Signed)
 referral sent last visit, can we follow up as to what happened? thx

## 2023-06-06 NOTE — Telephone Encounter (Signed)
 ok, and what happened with the referral?

## 2023-09-11 ENCOUNTER — Ambulatory Visit: Admitting: "Endocrinology

## 2023-09-14 ENCOUNTER — Telehealth: Payer: Self-pay | Admitting: Obstetrics & Gynecology

## 2023-09-14 NOTE — Telephone Encounter (Unsigned)
 Copied from CRM #[id number]. Topic: Clinical - Medication Refill >> Sep 14, 2023  4:25 PM Ismael A wrote: Medication: ibuprofen (ADVIL,MOTRIN) 600 MG tablet  Has the patient contacted their pharmacy? No (Agent: If no, request that the patient contact the pharmacy for the refill. If patient does not wish to contact the pharmacy document the reason why and proceed with request.) (Agent: If yes, when and what did the pharmacy advise?)  This is the patient's preferred pharmacy:  [facility] [id number] - [place], [place] - [address] [place] [address] Phone: [phone] Fax: [phone]  Is this the correct pharmacy for this prescription? Yes If no, delete pharmacy and type the correct one.   Has the prescription been filled recently? No  Is the patient out of the medication? No - 5 pills left  Has the patient been seen for an appointment in the last year OR does the patient have an upcoming appointment? Yes, states within the last year  Can we respond through MyChart? Yes  Agent: Please be advised that Rx refills may take up to 3 business days. We ask that you follow-up with your pharmacy.

## 2023-11-10 ENCOUNTER — Telehealth: Payer: Self-pay

## 2023-11-10 NOTE — Telephone Encounter (Signed)
 I reached out to pt, unable to leave a voicemail due to voicemail box not being set up yet.   Copied from CRM #[id number]. Topic: Clinical - Medication Question >> Nov 10, 2023  3:56 PM Berneda FALCON wrote: Reason for CRM: Merlin from [employer] is stating the patient is requesting Ibuprofen  600 MG. Did not state what this medication was for.  [phone] Would like this e-scribe

## 2023-11-13 ENCOUNTER — Encounter: Payer: Self-pay | Admitting: Family

## 2023-11-13 ENCOUNTER — Ambulatory Visit (INDEPENDENT_AMBULATORY_CARE_PROVIDER_SITE_OTHER): Admitting: Family

## 2023-11-13 ENCOUNTER — Telehealth: Payer: Self-pay | Admitting: Family

## 2023-11-13 VITALS — BP 122/88 | HR 88 | Temp 97.7°F | Ht 59.0 in | Wt 167.1 lb

## 2023-11-13 DIAGNOSIS — R140 Abdominal distension (gaseous): Secondary | ICD-10-CM | POA: Diagnosis not present

## 2023-11-13 DIAGNOSIS — Z23 Encounter for immunization: Secondary | ICD-10-CM | POA: Diagnosis not present

## 2023-11-13 DIAGNOSIS — G8929 Other chronic pain: Secondary | ICD-10-CM

## 2023-11-13 DIAGNOSIS — M25511 Pain in right shoulder: Secondary | ICD-10-CM

## 2023-11-13 DIAGNOSIS — E669 Obesity, unspecified: Secondary | ICD-10-CM | POA: Diagnosis not present

## 2023-11-13 MED ORDER — COVID-19 MRNA VAC-TRIS(PFIZER) 30 MCG/0.3ML IM SUSY
0.3000 mL | PREFILLED_SYRINGE | Freq: Once | INTRAMUSCULAR | 0 refills | Status: AC
Start: 2023-11-13 — End: 2023-11-13

## 2023-11-13 MED ORDER — NAPROXEN 500 MG PO TABS
500.0000 mg | ORAL_TABLET | Freq: Two times a day (BID) | ORAL | 0 refills | Status: DC
Start: 2023-11-13 — End: 2023-11-13

## 2023-11-13 MED ORDER — DICLOFENAC SODIUM 1 % EX GEL
4.0000 g | Freq: Four times a day (QID) | CUTANEOUS | 3 refills | Status: AC | PRN
Start: 2023-11-13 — End: ?

## 2023-11-13 NOTE — Telephone Encounter (Unsigned)
 Copied from CRM [id number]. Topic: Clinical - Medication Refill >> Nov 13, 2023 10:40 AM Rea ORN wrote: Medication: Ibuprofen  600 mg Not on current med list but pt discussed with PCP today  Has the patient contacted their pharmacy? No (Agent: If no, request that the patient contact the pharmacy for the refill. If patient does not wish to contact the pharmacy document the reason why and proceed with request.) (Agent: If yes, when and what did the pharmacy advise?)  This is the patient's preferred pharmacy:  [facility] [id number] - [place], [place] - [address] [place] [address] Phone: [phone] Fax: [phone]  Is this the correct pharmacy for this prescription? Yes If no, delete pharmacy and type the correct one.   Has the prescription been filled recently? No  Is the patient out of the medication? No  Has the patient been seen for an appointment in the last year OR does the patient have an upcoming appointment? Yes  Can we respond through MyChart? Yes  Agent: Please be advised that Rx refills may take up to 3 business days. We ask that you follow-up with your pharmacy.

## 2023-11-13 NOTE — Progress Notes (Signed)
 Patient ID: Jill Evans, female    DOB: 1990/03/01, 33 y.o.   MRN: [id number]  Chief Complaint  Patient presents with   Shoulder Pain    Pt c/o right shoulder pain, present for 2 years. Has tried ibuprofen  600 mg, twice a day. Pt would like a referral to Ortho.   Discussed the use of AI scribe software for clinical note transcription with the patient, who gave verbal consent to proceed.  History of Present Illness   Jill Evans is a 33 year old female who presents with right shoulder pain and requesting a referral for GI.  She experiences significant right shoulder pain, she has had for a year, but worsened by lifting her 22-pound daughter. The pain is localized around the shoulder blade and radiates to her back. It is severe, causing tears, and is associated with numbness in her hand when carrying her daughter for extended periods. She cannot sleep on her back due to the pain and hears a popping sound when raising her arm. She takes 1200 mg of ibuprofen  daily with limited relief.  She also is reporting ongoing bloating that she started experiencing before her pregnancy and it has persisted. She saw GI in [place] via [facility] who recommended changes in her diet which she has tried to do, but is still having sx.  Assessment and Plan    Right shoulder pain Chronic pain exacerbated by lifting, with limited range of motion and radiating pain. Insufficient relief from ibuprofen . Discussed possible cortisone injection for temporary relief. Discussed long acting NSAID preferable, but pt prefers Ibuprofen  for now. - Refer to sports medicine for further assessment and possible PT. - Continue Ibuprofen  prn, with max of 1200mg  daily. - Recommend OTC diclofenac gel for topical use up to four times daily, sending to local pharmacy. - Discussed benefits of physical therapy for rehabilitation.  Irritable bowel syndrome with constipation and bloating Chronic IBS with constipation and bloating  despite dietary changes. Previous gastroenterology consultation was insufficient. - Refer to [facility] for further evaluation and management.  Immunization Inquired about COVID-19 vaccination. Current guidelines require prescription for vaccination. - Administer Influenza vaccine today. - Send prescription for COVID-19 vaccination to [facility] pharmacy.      Subjective:    Outpatient Medications Prior to Visit  Medication Sig Dispense Refill   etonogestrel (NEXPLANON) 68 MG IMPL implant 1 each by Subdermal route once.     Prenatal Vit-Fe Fumarate-FA (PRENATAL MULTIVITAMIN) TABS tablet Take 1 tablet by mouth daily. Gummies     cyclobenzaprine  (FLEXERIL ) 5 MG tablet Take 5 mg by mouth 3 (three) times daily as needed for muscle spasms.     hydrocortisone  cream 1 % Apply 1 Application topically 3 (three) times daily. 60 g 1   promethazine  (PHENERGAN ) 25 MG tablet Take 25 mg by mouth every 6 (six) hours as needed for vomiting or nausea.     No facility-administered medications prior to visit.   Past Medical History:  Diagnosis Date   Abnormal fetal ultrasound 06/21/2022   Abnormal weight gain 09/22/2021   Anxiety    Cholestasis during pregnancy in third trimester 06/23/2022   Diagnosis at 30 weeks with bile acid 15  Ursodiol started 30 weeks     Encounter for initial prescription of intrauterine contraceptive device (IUD) 01/02/2018   GBS (group B Streptococcus carrier), +RV culture, currently pregnant 10/27/2017   History of pre-eclampsia    Hyperglycemia    Hypothyroidism    patient states she was  diagnosed in error   IBS (irritable bowel syndrome)    Late prenatal care 10/10/2017   Started at 28 weeks; pt states she did not know she was pregnant, found on xray incidentally   Malpresentation before onset of labor 10/26/2017   9/24: breech   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   MCA 1.62, fetal ascites     Partial deletion of  chromosome 13    Pregnancy induced hypertension    Previous cesarean delivery affecting pregnancy, antepartum 05/30/2022   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/2022   Anti-D     Rh negative state in antepartum period 10/26/2017   [x]  rhogam 9/24   Severe preeclampsia, delivered 10/24/2017     Guidelines for Antenatal Testing and Sonography  (with updated ICD-10 codes)  Updated  2016/10/27 INDICATION U/S NST/AFI or BPP wkly DELIVERY       CHTN - O10.919  Group I   BP < 140/90, no preeclampsia, AGA,  nml AFV, +/- meds    Group II   BP > 140/90, on meds, no preeclampsia, AGA, nml AFV  20-28-34-38  20-24-28-32-35-38  32//2 x wk  28//BPP wkly then 32//2 x wk or BPP wkly  40 no meds; 39 med   Supervision of high risk pregnancy due to social problems 10/26/2017   Wanting to give up for adoption ([employer])   Supervision of high risk pregnancy, antepartum 10/10/2017    Nursing Staff Provider  Office Location  [place] Dating   third trimester ultrasound  Language   English Anatomy US     Flu Vaccine  10/10/17 Genetic Screen  NIPS:   AFP:   First Screen:  Quad:    TDaP vaccine   10/10/17 Hgb A1C or  GTT Early  Third trimester ha1c 5.1  Rhogam   10-25-17   LAB RESULTS   Feeding Plan  Blood Type --/--/B NEG (09/28 1322) B negative  Contraception  Antibody POS (09/28 132   Vitamin D deficiency 06/30/2015   Past Surgical History:  Procedure Laterality Date   CESAREAN SECTION N/A 11/06/2017   Procedure: CESAREAN SECTION;  Surgeon: Nicholaus Burnard HERO, MD;  Location: [facility] BIRTHING SUITES;  Service: Obstetrics;  Laterality: N/A;   CESAREAN SECTION N/A 07/22/2022   Procedure: CESAREAN SECTION;  Surgeon: Diedre Rosaline BRAVO, MD;  Location: [facility] LD ORS;  Service: Obstetrics;  Laterality: N/A;  delivery at 35 weeks per MFM   mirena      inserted 2019   No Known Allergies    Objective:    Physical Exam Vitals and nursing note reviewed.  Constitutional:      Appearance: Normal appearance. She is obese.   Cardiovascular:     Rate and Rhythm: Normal rate and regular rhythm.  Pulmonary:     Effort: Pulmonary effort is normal.     Breath sounds: Normal breath sounds.  Musculoskeletal:     Right shoulder: Tenderness present. No swelling. Decreased range of motion. Decreased strength.  Skin:    General: Skin is warm and dry.  Neurological:     Mental Status: She is alert.  Psychiatric:        Mood and Affect: Mood normal.        Behavior: Behavior normal.    BP 122/88 (BP Location: Left Arm, Patient Position: Sitting, Cuff Size: Large)   Pulse 88   Temp 97.7 F (36.5 C) (Temporal)   Ht 4' 11 (1.499 m)   Wt 167 lb 2 oz (75.8 kg)  SpO2 96%   Breastfeeding No   BMI 33.76 kg/m  Wt Readings from Last 3 Encounters:  11/13/23 167 lb 2 oz (75.8 kg)  03/09/23 175 lb 12.8 oz (79.7 kg)  07/22/22 183 lb (83 kg)      Lucius Krabbe, NP

## 2023-11-14 MED ORDER — IBUPROFEN 600 MG PO TABS
600.0000 mg | ORAL_TABLET | Freq: Two times a day (BID) | ORAL | 1 refills | Status: AC | PRN
Start: 2023-11-14 — End: ?

## 2023-11-14 NOTE — Addendum Note (Signed)
 Addended by: Loise Esguerra on: 11/14/2023 07:18 AM   Modules accepted: Orders

## 2023-11-14 NOTE — Telephone Encounter (Signed)
 RX for Ibuprofen  sent.

## 2023-11-15 NOTE — Telephone Encounter (Signed)
 I reached out to pt. Pt verbalized understanding, no questions or concerns at this time.
